# Patient Record
Sex: Female | Born: 1935 | Race: White | Hispanic: No | Smoking: Never smoker
Health system: Southern US, Community
[De-identification: ages and names within clinical notes are randomized; demographics above are authoritative.]

## PROBLEM LIST (undated history)

## (undated) DIAGNOSIS — K219 Gastro-esophageal reflux disease without esophagitis: Secondary | ICD-10-CM

## (undated) DIAGNOSIS — N183 Chronic kidney disease, stage 3 unspecified: Secondary | ICD-10-CM

## (undated) DIAGNOSIS — I10 Essential (primary) hypertension: Secondary | ICD-10-CM

## (undated) DIAGNOSIS — J189 Pneumonia, unspecified organism: Secondary | ICD-10-CM

## (undated) HISTORY — PX: SHOULDER SURGERY: SHX246

## (undated) HISTORY — PX: OTHER SURGICAL HISTORY: SHX169

## (undated) HISTORY — PX: FOOT SURGERY: SHX648

---

## 2005-02-24 ENCOUNTER — Emergency Department (HOSPITAL_COMMUNITY): Admission: EM | Admit: 2005-02-24 | Discharge: 2005-02-25 | Payer: Self-pay | Admitting: Emergency Medicine

## 2005-03-12 ENCOUNTER — Inpatient Hospital Stay (HOSPITAL_COMMUNITY): Admission: EM | Admit: 2005-03-12 | Discharge: 2005-03-15 | Payer: Self-pay | Admitting: Orthopedic Surgery

## 2010-07-11 ENCOUNTER — Other Ambulatory Visit: Payer: Self-pay | Admitting: Orthopedic Surgery

## 2010-07-11 ENCOUNTER — Other Ambulatory Visit (HOSPITAL_COMMUNITY): Payer: Self-pay | Admitting: Orthopedic Surgery

## 2010-07-11 ENCOUNTER — Ambulatory Visit (HOSPITAL_COMMUNITY)
Admission: RE | Admit: 2010-07-11 | Discharge: 2010-07-11 | Disposition: A | Discharge status: Home / Self Care | Payer: Medicare Other | Source: Ambulatory Visit | Attending: Orthopedic Surgery | Admitting: Orthopedic Surgery

## 2010-07-11 ENCOUNTER — Encounter (HOSPITAL_COMMUNITY): Payer: Medicare Other

## 2010-07-11 DIAGNOSIS — M1710 Unilateral primary osteoarthritis, unspecified knee: Secondary | ICD-10-CM | POA: Insufficient documentation

## 2010-07-11 DIAGNOSIS — Z01812 Encounter for preprocedural laboratory examination: Secondary | ICD-10-CM | POA: Insufficient documentation

## 2010-07-11 DIAGNOSIS — IMO0002 Reserved for concepts with insufficient information to code with codable children: Secondary | ICD-10-CM | POA: Insufficient documentation

## 2010-07-11 DIAGNOSIS — Z01811 Encounter for preprocedural respiratory examination: Secondary | ICD-10-CM | POA: Insufficient documentation

## 2010-07-11 DIAGNOSIS — Z01818 Encounter for other preprocedural examination: Secondary | ICD-10-CM | POA: Insufficient documentation

## 2010-07-11 LAB — CBC
HCT: 44.6 % (ref 36.0–46.0)
Hemoglobin: 14.8 g/dL (ref 12.0–15.0)
MCH: 31.7 pg (ref 26.0–34.0)
MCHC: 33.2 g/dL (ref 30.0–36.0)
MCV: 95.5 fL (ref 78.0–100.0)
Platelets: 227 10*3/uL (ref 150–400)
RBC: 4.67 MIL/uL (ref 3.87–5.11)
RDW: 12.2 % (ref 11.5–15.5)
WBC: 6.9 10*3/uL (ref 4.0–10.5)

## 2010-07-11 LAB — URINALYSIS, ROUTINE W REFLEX MICROSCOPIC
Bilirubin Urine: NEGATIVE
Glucose, UA: NEGATIVE mg/dL
Hgb urine dipstick: NEGATIVE
Ketones, ur: 15 mg/dL — AB
Nitrite: NEGATIVE
Protein, ur: NEGATIVE mg/dL
Specific Gravity, Urine: 1.02 (ref 1.005–1.030)
Urobilinogen, UA: 0.2 mg/dL (ref 0.0–1.0)

## 2010-07-11 LAB — COMPREHENSIVE METABOLIC PANEL
ALT: 15 U/L (ref 0–35)
AST: 23 U/L (ref 0–37)
Albumin: 4 g/dL (ref 3.5–5.2)
Alkaline Phosphatase: 74 U/L (ref 39–117)
CO2: 27 mEq/L (ref 19–32)
Calcium: 10.7 mg/dL — ABNORMAL HIGH (ref 8.4–10.5)
Chloride: 109 mEq/L (ref 96–112)
Creatinine, Ser: 0.96 mg/dL (ref 0.4–1.2)
GFR calc Af Amer: >60 mL/min (ref >60)
GFR calc non Af Amer: 57 mL/min — ABNORMAL LOW (ref >60)
Glucose, Bld: 85 mg/dL (ref 70–99)
Sodium: 142 mEq/L (ref 135–145)
Total Bilirubin: 1.2 mg/dL (ref 0.3–1.2)
Total Protein: 7.4 g/dL (ref 6.0–8.3)

## 2010-07-11 LAB — PROTIME-INR: Prothrombin Time: 13.7 seconds (ref 11.6–15.2)

## 2010-07-11 LAB — SURGICAL PCR SCREEN: Staphylococcus aureus: NEGATIVE

## 2010-07-20 ENCOUNTER — Inpatient Hospital Stay (HOSPITAL_COMMUNITY)
Admission: RE | Admit: 2010-07-20 | Discharge: 2010-07-23 | DRG: 470 | Disposition: A | Discharge status: Skilled Nursing Facility | Payer: Medicare Other | Source: Ambulatory Visit | Attending: Orthopedic Surgery | Admitting: Orthopedic Surgery

## 2010-07-20 DIAGNOSIS — R110 Nausea: Secondary | ICD-10-CM | POA: Diagnosis not present

## 2010-07-20 DIAGNOSIS — I10 Essential (primary) hypertension: Secondary | ICD-10-CM | POA: Diagnosis present

## 2010-07-20 DIAGNOSIS — Z01812 Encounter for preprocedural laboratory examination: Secondary | ICD-10-CM

## 2010-07-20 DIAGNOSIS — E785 Hyperlipidemia, unspecified: Secondary | ICD-10-CM | POA: Diagnosis present

## 2010-07-20 DIAGNOSIS — T40605A Adverse effect of unspecified narcotics, initial encounter: Secondary | ICD-10-CM | POA: Diagnosis not present

## 2010-07-20 DIAGNOSIS — M1710 Unilateral primary osteoarthritis, unspecified knee: Principal | ICD-10-CM | POA: Diagnosis present

## 2010-07-20 LAB — TYPE AND SCREEN
ABO/RH(D): O POS
Antibody Screen: NEGATIVE

## 2010-07-20 LAB — ABO/RH: ABO/RH(D): O POS

## 2010-07-21 LAB — CBC
HCT: 34.2 % — ABNORMAL LOW (ref 36.0–46.0)
Hemoglobin: 11.5 g/dL — ABNORMAL LOW (ref 12.0–15.0)
MCH: 31.5 pg (ref 26.0–34.0)
MCHC: 33.6 g/dL (ref 30.0–36.0)
MCV: 93.7 fL (ref 78.0–100.0)
Platelets: 200 10*3/uL (ref 150–400)
RBC: 3.65 MIL/uL — ABNORMAL LOW (ref 3.87–5.11)
RDW: 12.3 % (ref 11.5–15.5)
WBC: 11.4 10*3/uL — ABNORMAL HIGH (ref 4.0–10.5)

## 2010-07-21 LAB — BASIC METABOLIC PANEL
BUN: 16 mg/dL (ref 6–23)
Calcium: 9.4 mg/dL (ref 8.4–10.5)
Chloride: 107 mEq/L (ref 96–112)
Creatinine, Ser: 0.83 mg/dL (ref 0.4–1.2)
GFR calc Af Amer: >60 mL/min (ref >60)
Glucose, Bld: 120 mg/dL — ABNORMAL HIGH (ref 70–99)
Potassium: 3.9 mEq/L (ref 3.5–5.1)
Sodium: 139 mEq/L (ref 135–145)

## 2010-07-21 NOTE — H&P (Addendum)
NAME:  Gina [name], Gina NO.:  [id number]  MEDICAL RECORD NO.:  [id number]           PATIENT TYPE:  LOCATION:                                 FACILITY:  PHYSICIAN:  Ollen Gross, M.D.    DATE OF BIRTH:  April 01, 1933  DATE OF ADMISSION: DATE OF DISCHARGE:                             HISTORY & PHYSICAL   CHIEF COMPLAINT:  Right knee pain.  BRIEF HISTORY:  Gina [name] came in to see Dr. Lequita Halt back in January regarding bilateral knee pain that was worse on the right than the left. She states she has had problems with the knees for several years now, it seems to have gotten worse over the last year.  She states she has pain most of the time, but the worst part about this is her loss of function. She has had cortisone injections in the past without relief.  She is at a stage now where she is seeking more permanent solution to this and she now presents for right total knee arthroplasty.  MEDICATION ALLERGIES:  CODEINE.  She states she does fine with Vicodin. She denies any food, latex or metal allergies.  CURRENT MEDICATIONS: 1. Verapamil. 2. Benicar. 3. Vitamin D. 4. Advil, which she will discontinue 5 days prior to surgery. 5. Voltaren gel.  PRIMARY CARE PHYSICIAN:  Dr. Nolon Nations.  PAST MEDICAL HISTORY: 1. End-stage arthritis of the right knee. 2. History of shingles at the age of 89. 3. Dentures. 4. History of pneumonia 12 or 13 years ago. 5. Hypertension. 6. Hyperlipidemia. 7. Arthritis. 8. Menopause. 9. History of measles and mumps as a child.  PAST SURGICAL HISTORY:  Partial right shoulder replacement in 2006. Benign cyst removed from her neck in 1979.  FAMILY HISTORY:  Father passed at age 68, he had bone cancer.  Mother is living, she is 63.  She has heart disease and Alzheimer's.  SOCIAL HISTORY:  The patient is divorced.  She is retired.  She denies past or present use of alcohol or tobacco products.  She has 2 children. She lives  alone.  Following her hospital stay, she would like to go to some place.  Ms. Harl has been cleared for surgery by Dr. Ivory Broad, her primary care physician.  REVIEW OF SYSTEMS:  GENERAL:  Negative for fevers, chills, or weight change.  HEENT:  Positive for dentures.  Negative for blurred vision or insomnia.  DERMATOLOGIC:  Negative for rash or lesion.  RESPIRATORY: Negative for shortness of breath at rest or with exertion. CARDIOVASCULAR:  Negative for chest pain or palpitations.  GI:  Negative for nausea, vomiting, diarrhea.  GU: Negative for hematuria or dysuria. MUSCULOSKELETAL:  Positive for joint pain and morning stiffness.  PHYSICAL EXAMINATION:  VITAL SIGNS:  Pulse 76, respirations 18, blood pressure 116/82 in the left arm. GENERAL:  Gina [name] is alert and oriented x3.  She is a pleasant 77- year-old female.  She is well developed, well nourished, in no apparent distress.  She is at stated height of 5 feet 7 inches and stated weight of 200 pounds.  She is accompanied today by her daughter.  HEENT:  Normocephalic, atraumatic.  Extraocular movements intact. Positive for glasses.  Positive for dentures. NECK:  Supple.  Full range of motion without lymphadenopathy. CHEST:  Lungs are clear to auscultation bilaterally without wheezes, rhonchi, or rales. HEART:  Regular rate and rhythm without murmur. ABDOMEN:  Bowel sounds present in all 4 quadrants.  Abdomen is soft, nontender to palpation. EXTREMITIES:  Right knee range of motion 10 to 115 degrees, moderate crepitus throughout.  Negative for effusion, tenderness to palpation over the medial greater than lateral joint line. NEUROLOGIC:  Intact. PERIPHERAL VASCULAR:  Carotid pulses 2+ bilaterally without bruit.  RADIOGRAPHS:  AP and lateral views of the right knee reveal bone-on-bone changes in the medial and patellofemoral compartments.  IMPRESSION:  Right knee pain and AP and lateral views of the right knee reveal bone-on-bone  changes in the medial and patellofemoral compartments.  PLAN:  Right total knee arthroplasty to be performed by Dr. Lequita Halt.     Rozell Searing, PAC   ______________________________ Ollen Gross, M.D.    LD/MEDQ  D:  07/19/2010  T:  07/20/2010  Job:  [id number]  cc:   Dr. Nolon Nations  Electronically Signed by Rozell Searing  on 07/21/2010 08:08:09 AM Electronically Signed by Ollen Gross M.D. on 07/22/2010 09:02:25 AM

## 2010-07-22 LAB — BASIC METABOLIC PANEL
BUN: 10 mg/dL (ref 6–23)
CO2: 25 mEq/L (ref 19–32)
Calcium: 9.2 mg/dL (ref 8.4–10.5)
Creatinine, Ser: 0.89 mg/dL (ref 0.4–1.2)
GFR calc Af Amer: >60 mL/min (ref >60)
GFR calc non Af Amer: >60 mL/min (ref >60)
Glucose, Bld: 102 mg/dL — ABNORMAL HIGH (ref 70–99)
Potassium: 3.8 mEq/L (ref 3.5–5.1)
Sodium: 137 mEq/L (ref 135–145)

## 2010-07-22 LAB — CBC
HCT: 30.8 % — ABNORMAL LOW (ref 36.0–46.0)
Hemoglobin: 10.4 g/dL — ABNORMAL LOW (ref 12.0–15.0)
MCH: 31.6 pg (ref 26.0–34.0)
MCHC: 33.8 g/dL (ref 30.0–36.0)
MCV: 93.6 fL (ref 78.0–100.0)
Platelets: 160 10*3/uL (ref 150–400)
RBC: 3.29 MIL/uL — ABNORMAL LOW (ref 3.87–5.11)
RDW: 12.5 % (ref 11.5–15.5)
WBC: 10.1 10*3/uL (ref 4.0–10.5)

## 2010-07-22 NOTE — Op Note (Signed)
NAME:  Gina [name], Gina [name] NO.:  [id number]  MEDICAL RECORD NO.:  [id number]           PATIENT TYPE:  I  LOCATION:  0005                         FACILITY:  [facility]  PHYSICIAN:  Ollen Gross, M.D.    DATE OF BIRTH:  May 25, 1932  DATE OF PROCEDURE: DATE OF DISCHARGE:                              OPERATIVE REPORT   PREOPERATIVE DIAGNOSIS:  Osteoarthritis, right knee.  POSTOPERATIVE DIAGNOSIS:  Osteoarthritis, right knee.  PROCEDURE:  Right total knee arthroplasty.  SURGEON:  Ollen Gross, MD  ASSISTANT:  Alexzandrew L. Perkins, PAC  ANESTHESIA:  General.  ESTIMATED BLOOD LOSS:  Minimal.  DRAINS:  Hemovac x1.  TOURNIQUET TIME:  32 minutes at 300 mmHg.  COMPLICATIONS:  None.  CONDITION:  Stable to recovery.  BRIEF CLINICAL NOTE:  Gina [name] is a 77 year old female with end-stage arthritis of the right knee with progressively worsening pain and dysfunction.  She has failed nonoperative management and presents for right total knee arthroplasty.  PROCEDURE IN DETAIL:  After successful administration of general anesthetic, a tourniquet was placed on the right thigh and right lower extremity prepped and draped in usual sterile fashion.  Extremities wrapped in Esmarch, knee flexed, tourniquet inflated to 300 mmHg. Midline incision made with a 10 blade through subcutaneous tissue to the level of the extensor mechanism.  A fresh blade is used make a medial parapatellar arthrotomy.  Soft tissue on the proximal medial tibia is subperiosteally elevated to the joint line with the knife and into the semimembranosus bursa with a Cobb elevator.  Soft tissue laterally is elevated with attention being paid to avoid patellar tendon or tibial tubercle.  The patella was everted, knee flexed 90 degrees, and ACL and PCL removed.  Drill was used to create a starting hole in the distal femur.  Canal was thoroughly irrigated.  A 5 degrees right valgus alignment guide was placed  in distal femoral cutting block, pinned to remove 11 mm off the distal femur.  Distal femoral resection is made an oscillating saw.  The tibia subluxed forward and menisci removed.  Extramedullary tibial alignment guide is placed referencing proximally at the medial aspect of the tibial tubercle and distally along the second metatarsal axis tibial crest.  A block is pinned to remove 2 mm off the more deficient medial side.  Tibial resection is made with an oscillating saw.  Size 3 is the most appropriate tibial component and proximal tibia prepared to modular drill and keel punch for the size 3.  Femoral sizing guide is placed, size 3 is the most appropriate on the femur.  Size 3 cutting block is placed with the rotation marked off the epicondylar axis and confirmed by creating a rectangular flexion gap at 90 degrees.  Block is pinned in position and the anterior-posterior and chamfer cuts made.  Intercondylar block is placed and neck cut is made. Trial size 3 posterior stabilized femoral component was placed.  A 12.5- mm posterior stabilized rotating platform insert trial was placed.  A 12.5 full extensions achieved with excellent varus-valgus and anterior- posterior balance throughout full range of motion.  The patella was everted,  thickness measured to be 21 mm.  Freehand resection taken to 12 mm, 35 template is placed, lug holes were drilled, trial patella was placed and tracks normally.  Osteophytes removed off the posterior femur with the trial in place.  All trials removed and the cut bone surfaces prepared with pulsatile lavage.  Cements mixed and once ready, implantation of size 3 mobile bearing tibial tray, size 3 posterior stabilized femur, and 35 patella are cemented in place.  The patella was held with a clamp.  Trial 12.5-mm inserts placed, knee held in full extension, all extruded cement removed.  Cement fully hardened, then the permanent 12.5-mm posterior stabilized  rotating platform insert is placed in tibial tray.  Wounds copiously irrigated with saline solution. The arthrotomy closed over Hemovac drain with interrupted #1 PDS. Flexion against gravity to 140 degrees, the patella tracks normally. Tourniquet released for a total tourniquet time of 32 minutes.  Subcu was closed with interrupted 2-0 Vicryl, subcuticular running 4-0 Monocryl.  Catheter for Marcaine pain pump was placed and pumps initiated.  Incisions cleaned and dried and Steri-Strips and bulky sterile dressing applied.  She is then placed into a knee immobilizer, awakened and transported to recovery in stable condition.     Ollen Gross, M.D.     FA/MEDQ  D:  07/20/2010  T:  07/20/2010  Job:  [id number]  Electronically Signed by Ollen Gross M.D. on 07/22/2010 09:02:28 AM

## 2010-07-23 LAB — CBC
HCT: 28.8 % — ABNORMAL LOW (ref 36.0–46.0)
Hemoglobin: 9.5 g/dL — ABNORMAL LOW (ref 12.0–15.0)
MCH: 30.8 pg (ref 26.0–34.0)
MCHC: 33 g/dL (ref 30.0–36.0)
MCV: 93.5 fL (ref 78.0–100.0)
Platelets: 163 10*3/uL (ref 150–400)
RDW: 12.2 % (ref 11.5–15.5)
WBC: 10.1 10*3/uL (ref 4.0–10.5)

## 2010-08-08 NOTE — Discharge Summary (Signed)
  NAME:  Gina [name], LOTTER NO.:  [id number]  MEDICAL RECORD NO.:  [id number]           PATIENT TYPE:  I  LOCATION:  1538                         FACILITY:  [facility]  PHYSICIAN:  Ollen Gross, M.D.    DATE OF BIRTH:  27-Jul-1932  DATE OF ADMISSION:  07/20/2010 DATE OF DISCHARGE:  07/23/2010                        DISCHARGE SUMMARY - REFERRING   ADDENDUM:  ADMITTING DIAGNOSIS:  Please see previous discharge summary.  DISCHARGE DIAGNOSIS:  Please see previous discharge summary.  PROCEDURE:  Right total knee.  HOSPITAL COURSE:  The patient was seen on rounds on Saturday, July 23, 2010, by Dr. Lequita Halt.  She had had some intermittent nausea but difficulty taking p.o.'s but by Saturday, her meds were changed and she was tolerating her p.o.'s much better as well as she tolerated her lunch and solid food.  She will be transferred to [facility]  DISCHARGE PLAN:  The patient will be transferred to [facility] on July 23, 2010.  Discharge plan and medications as per previously dictated summary.  CONDITION ON DISCHARGE:  Improving.     Alexzandrew L. Julien Girt, P.A.C.   ______________________________ Ollen Gross, M.D.    ALP/MEDQ  D:  07/23/2010  T:  07/23/2010  Job:  [id number]  Electronically Signed by Patrica Duel P.A.C. on 07/27/2010 12:46:44 PM Electronically Signed by Ollen Gross M.D. on 08/08/2010 07:09:27 AM

## 2010-08-15 NOTE — Discharge Summary (Signed)
NAME:  MAPLE, ODANIEL NO.:  [id number]  MEDICAL RECORD NO.:  [id number]           PATIENT TYPE:  I  LOCATION:  1538                         FACILITY:  [facility]  PHYSICIAN:  Ollen Gross, M.D.    DATE OF BIRTH:  [birth year]  DATE OF ADMISSION:  07/20/2010 DATE OF DISCHARGE:  07/23/2010                              DISCHARGE SUMMARY   TENTATIVE DATE OF DISCHARGE:  07/23/2010  ADMITTING DIAGNOSES: 1. Right knee end-stage arthritis. 2. History of shingles. 3. History of pneumonia. 4. Hypertension. 5. Hyperlipidemia. 6. Postmenopausal. 7. Childhood illnesses of measles, mumps.  DISCHARGE DIAGNOSES: 1. Osteoarthritis, right knee, status post right total knee     replacement arthroplasty. 2. History of shingles. 3. History of pneumonia. 4. Hypertension. 5. Hyperlipidemia. 6. Postmenopausal. 7. Childhood illnesses of measles, mumps.  PROCEDURE:  July 20, 2010, right total knee.  Surgeon, Dr. Lequita Halt. Assistant, Alexzandrew L. Perkins, P.A.C.  Anesthesia was general. Tourniquet time was 32 minutes.  CONSULTS:  None.  BRIEF HISTORY:  The patient is a 77 year old female with end-stage arthritis of the right knee with progressive worsening pain and dysfunction, failed nonoperative management and now presents for a total knee arthroplasty.  LABORATORY DATA:  Preop CBC showed hemoglobin of 14.8, hematocrit of 44.6, white cell count 6.9, platelets 227.  Postop hemoglobin 11.5. Last noted H and H at time of dictation on postop day #2 was 10.4 and 30.8.  One more CBC is pending on the possible day of discharge.  PT/INR of 13.7 and 1.03 on admission with PTT of 35.  Chem panel on admission, minimally elevated BUN at 25.  Remaining Chem panel within normal limits.  Calcium was slightly elevated at 10.7.  Preop UA was little cloudy, positive ketones, otherwise negative.  Blood group type O positive.  Nasal swabs were negative for staph aureus, negative for MRSA.   Serial BMETs were followed for 48 hours.  Electrolytes all remained within normal limits.  X-rays, two-view chest taken on July 11, 2010, no active cardiopulmonary disease.  EKG dated July 11, 2010, sinus rhythm with first-degree AV block, nonspecific ST and T-wave abnormalities confirmed by Dr. Lewayne Bunting.  HOSPITAL COURSE:  The patient admitted to [facility], taken to OR, underwent above-stated procedure without complication.  The patient tolerated the procedure well, later transferred to the recovery room on the orthopedic floor, given 24 hours postop IV antibiotics, given Xarelto for DVT prophylaxis.  She did have some nausea through the night.  It was felt be due to the IV morphine, so we discontinued that and gave her IV Dilaudid as a backup but encouraged her to take p.o. meds once her nausea improved.  Her blood pressure meds were restarted but on parameters due to the soft blood pressure following surgery hemoglobin was 10.5.  Hemovac drain at time of surgery was removed on day #1.  By day #2, the patient still had a little bit of nausea and had not had much p.o. intake.  At that point, we felt that it might have been the oral oxycodone, so we discontinued that and switched over to Dilaudid, see  if she could better control with it.  From a therapy standpoint, she was slowly progressing and she was up walking only about 8 or 10 feet on day #2.  Dressing changed, incision looked good.  It was felt that the patient would require skilled facility.  She wanted to look into a skilled facility for continued care.  She had been seen on rounds on Friday and postop day #2 and it was felt that there was a possibility that a bed was available on Saturday and the facility was able to take weekend discharges that she possibly go tomorrow on the day of July 23, 2010.  DISCHARGE/PLAN: 1. Possible discharge tomorrow on July 23, 2010, depending on bed     availability. 2.  Discharge diagnosis:  Please see above. 3. Discharge medications:  Current medications at time of dictation include: 1. Xarelto 10 mg p.o. daily for 2 weeks, then discontinue the Xarelto 2. Colace.100 mg p.o. b.i.d. 3. Verapamil 100 mg p.o. q.h.s. 4. Benicar 20 mg p.o. a.m. 5. Robaxin 500 mg p.o. q.6-8 h p.r.n. spasm. 6. Dilaudid 2 mg one or two every 4 to 6 hours as needed for pain. 7. Tylenol 325 one or two every 4 to 6 hours as needed for mild pain,     temperature or headache.  DIET:  As tolerated.  Heart-healthy diet.  ACTIVITY:  Weightbearing as tolerated.  Total knee protocol.  PT and OT for gait training, ambulation, ADLs, range of motion, and strengthening exercises.  FOLLOWUP:  The patient needs to follow up Dr. Lequita Halt in the office on Tuesday the 24th.  Please contact the office at [phone] to help the patient arrange followup care and transportation over at [facility].  DISPOSITION:  Pending at time of dictation.  CONDITION ON DISCHARGE:  Slowly improving and final condition is pending at time of dictation.     Alexzandrew L. Julien Girt, P.A.C.   ______________________________Frank Lequita Halt, M.D.    ALP/MEDQ  D:  07/22/2010  T:  07/22/2010  Job:  [id number]  cc:   Dr. Nolon Nations  Electronically Signed by Patrica Duel P.A.C. on 08/11/2010 10:02:29 AM Electronically Signed by Ollen Gross M.D. on 08/15/2010 06:46:51 PM

## 2011-06-29 ENCOUNTER — Ambulatory Visit: Payer: Medicare Other | Attending: Orthopedic Surgery | Admitting: Physical Therapy

## 2011-06-29 DIAGNOSIS — R262 Difficulty in walking, not elsewhere classified: Secondary | ICD-10-CM | POA: Insufficient documentation

## 2011-06-29 DIAGNOSIS — M542 Cervicalgia: Secondary | ICD-10-CM | POA: Insufficient documentation

## 2011-06-29 DIAGNOSIS — IMO0001 Reserved for inherently not codable concepts without codable children: Secondary | ICD-10-CM | POA: Insufficient documentation

## 2011-06-29 DIAGNOSIS — M2560 Stiffness of unspecified joint, not elsewhere classified: Secondary | ICD-10-CM | POA: Insufficient documentation

## 2011-07-04 ENCOUNTER — Ambulatory Visit: Payer: Medicare Other

## 2011-07-11 ENCOUNTER — Ambulatory Visit: Payer: Medicare Other | Attending: Orthopedic Surgery

## 2011-07-11 DIAGNOSIS — R262 Difficulty in walking, not elsewhere classified: Secondary | ICD-10-CM | POA: Insufficient documentation

## 2011-07-11 DIAGNOSIS — M2560 Stiffness of unspecified joint, not elsewhere classified: Secondary | ICD-10-CM | POA: Insufficient documentation

## 2011-07-11 DIAGNOSIS — IMO0001 Reserved for inherently not codable concepts without codable children: Secondary | ICD-10-CM | POA: Insufficient documentation

## 2011-07-11 DIAGNOSIS — M542 Cervicalgia: Secondary | ICD-10-CM | POA: Insufficient documentation

## 2011-07-13 ENCOUNTER — Ambulatory Visit: Payer: Medicare Other | Admitting: Rehabilitation

## 2011-07-20 ENCOUNTER — Ambulatory Visit: Payer: Medicare Other | Admitting: Physical Therapy

## 2011-07-25 ENCOUNTER — Ambulatory Visit: Payer: Medicare Other | Admitting: Physical Therapy

## 2011-07-27 ENCOUNTER — Ambulatory Visit: Payer: Medicare Other | Admitting: Rehabilitation

## 2020-06-11 ENCOUNTER — Emergency Department (HOSPITAL_COMMUNITY): Payer: Medicare Other

## 2020-06-11 ENCOUNTER — Other Ambulatory Visit: Payer: Self-pay

## 2020-06-11 ENCOUNTER — Inpatient Hospital Stay (HOSPITAL_COMMUNITY)
Admission: EM | Admit: 2020-06-11 | Discharge: 2020-06-13 | DRG: 871 | Disposition: A | Discharge status: Home Health | Payer: Medicare Other | Attending: Family Medicine | Admitting: Family Medicine

## 2020-06-11 DIAGNOSIS — E876 Hypokalemia: Secondary | ICD-10-CM | POA: Diagnosis not present

## 2020-06-11 DIAGNOSIS — Z8249 Family history of ischemic heart disease and other diseases of the circulatory system: Secondary | ICD-10-CM | POA: Diagnosis not present

## 2020-06-11 DIAGNOSIS — N1831 Chronic kidney disease, stage 3a: Secondary | ICD-10-CM | POA: Diagnosis not present

## 2020-06-11 DIAGNOSIS — M1712 Unilateral primary osteoarthritis, left knee: Secondary | ICD-10-CM | POA: Diagnosis not present

## 2020-06-11 DIAGNOSIS — Z20822 Contact with and (suspected) exposure to covid-19: Secondary | ICD-10-CM | POA: Diagnosis present

## 2020-06-11 DIAGNOSIS — E871 Hypo-osmolality and hyponatremia: Secondary | ICD-10-CM | POA: Diagnosis not present

## 2020-06-11 DIAGNOSIS — B961 Klebsiella pneumoniae [K. pneumoniae] as the cause of diseases classified elsewhere: Secondary | ICD-10-CM | POA: Diagnosis not present

## 2020-06-11 DIAGNOSIS — W1830XA Fall on same level, unspecified, initial encounter: Secondary | ICD-10-CM | POA: Diagnosis not present

## 2020-06-11 DIAGNOSIS — Z79899 Other long term (current) drug therapy: Secondary | ICD-10-CM | POA: Diagnosis not present

## 2020-06-11 DIAGNOSIS — H9190 Unspecified hearing loss, unspecified ear: Secondary | ICD-10-CM | POA: Diagnosis present

## 2020-06-11 DIAGNOSIS — W19XXXA Unspecified fall, initial encounter: Secondary | ICD-10-CM

## 2020-06-11 DIAGNOSIS — A419 Sepsis, unspecified organism: Principal | ICD-10-CM | POA: Diagnosis present

## 2020-06-11 DIAGNOSIS — M549 Dorsalgia, unspecified: Secondary | ICD-10-CM | POA: Diagnosis present

## 2020-06-11 DIAGNOSIS — K219 Gastro-esophageal reflux disease without esophagitis: Secondary | ICD-10-CM | POA: Diagnosis present

## 2020-06-11 DIAGNOSIS — S0001XA Abrasion of scalp, initial encounter: Secondary | ICD-10-CM | POA: Diagnosis not present

## 2020-06-11 DIAGNOSIS — R509 Fever, unspecified: Secondary | ICD-10-CM | POA: Diagnosis present

## 2020-06-11 DIAGNOSIS — Y92009 Unspecified place in unspecified non-institutional (private) residence as the place of occurrence of the external cause: Secondary | ICD-10-CM

## 2020-06-11 DIAGNOSIS — I129 Hypertensive chronic kidney disease with stage 1 through stage 4 chronic kidney disease, or unspecified chronic kidney disease: Secondary | ICD-10-CM | POA: Diagnosis present

## 2020-06-11 DIAGNOSIS — R531 Weakness: Secondary | ICD-10-CM

## 2020-06-11 DIAGNOSIS — N390 Urinary tract infection, site not specified: Secondary | ICD-10-CM | POA: Diagnosis present

## 2020-06-11 DIAGNOSIS — I10 Essential (primary) hypertension: Secondary | ICD-10-CM | POA: Diagnosis present

## 2020-06-11 DIAGNOSIS — Z885 Allergy status to narcotic agent status: Secondary | ICD-10-CM | POA: Diagnosis not present

## 2020-06-11 DIAGNOSIS — Z801 Family history of malignant neoplasm of trachea, bronchus and lung: Secondary | ICD-10-CM

## 2020-06-11 DIAGNOSIS — Z96651 Presence of right artificial knee joint: Secondary | ICD-10-CM | POA: Diagnosis not present

## 2020-06-11 DIAGNOSIS — R6510 Systemic inflammatory response syndrome (SIRS) of non-infectious origin without acute organ dysfunction: Secondary | ICD-10-CM | POA: Diagnosis present

## 2020-06-11 DIAGNOSIS — G8929 Other chronic pain: Secondary | ICD-10-CM | POA: Diagnosis present

## 2020-06-11 DIAGNOSIS — I959 Hypotension, unspecified: Secondary | ICD-10-CM | POA: Diagnosis not present

## 2020-06-11 DIAGNOSIS — J189 Pneumonia, unspecified organism: Secondary | ICD-10-CM | POA: Diagnosis not present

## 2020-06-11 HISTORY — DX: Chronic kidney disease, stage 3 unspecified: N18.30

## 2020-06-11 HISTORY — DX: Gastro-esophageal reflux disease without esophagitis: K21.9

## 2020-06-11 HISTORY — DX: Essential (primary) hypertension: I10

## 2020-06-11 NOTE — ED Provider Notes (Signed)
[facility] EMERGENCY DEPARTMENT Provider Note   CSN: [id number] Arrival date & time: 06/11/20  2117     History Chief Complaint  Patient presents with  . Fall    Gina [name] is a 87 y.o. female with hx of HTN, GERD, LE edema.   HPI  History provided by patient and her daughter.  Daughter went to her moms house today with her husband to adjust the temperature in her home. Pt has been taking antibiotics for a UTI that were given to her by her PCP. Daughter states she noticed her mom had been shuffling when she walked. Pt lives alone and is independent.   Daughter witness her mom fall today outside the locked door. Pt's daughter and son-in-law had to break into her mom's home as the heavy duty lock was on. This took about 10 minutes. The pt continued to talk to her daughter. There was no LOC. The pt did hit her head on the wooden table during the fall and injuring her scalp. Pt says she felt weak yesterday and blames the antibiotic. Daughter did not notice any difference in her mom yesterday.   Denies cough, fever, dysuria, chest pain, shortness of breath, back pain, abdominal pain, vomiting, diarrhea. Has chronic left knee pain and neck pain. Endorses back pain since the fall.      No past medical history on file.  There are no problems to display for this patient.     OB History   No obstetric history on file.     No family history on file.     Home Medications Prior to Admission medications   Not on File    Allergies    Patient has no allergy information on record.  Review of Systems   Review of Systems  Constitutional: Negative for fever.  HENT: Negative for sore throat.   Eyes:       No blurry vision  Respiratory: Negative for cough and shortness of breath.   Cardiovascular: Negative for chest pain.  Gastrointestinal: Negative for abdominal pain, nausea and vomiting.  Genitourinary: Negative for dysuria.  Musculoskeletal: Positive for  back pain. Negative for neck pain (chronic).  Skin: Positive for wound.  Neurological: Negative for dizziness, syncope, light-headedness and headaches.  All other systems reviewed and are negative.   Physical Exam Updated Vital Signs BP (!) 117/49   Pulse 91   Temp 98 F (36.7 C) (Oral)   Resp (!) 26   SpO2 94%   Physical Exam Vitals and nursing note reviewed.  Constitutional:      Appearance: Normal appearance. She is not ill-appearing.     Comments: Laughing with staff and daughter  HENT:     Head: Normocephalic.     Comments: Occiput abrasion     Nose: Rhinorrhea present.     Mouth/Throat:     Mouth: Mucous membranes are dry.     Pharynx: Oropharynx is clear. No oropharyngeal exudate or posterior oropharyngeal erythema.  Eyes:     Extraocular Movements: Extraocular movements intact.     Conjunctiva/sclera: Conjunctivae normal.     Comments: Wearing glasses  Cardiovascular:     Rate and Rhythm: Normal rate and regular rhythm.     Pulses: Normal pulses.     Heart sounds: Normal heart sounds. No murmur heard.   Pulmonary:     Effort: Pulmonary effort is normal. No respiratory distress.     Breath sounds: Normal breath sounds. No wheezing, rhonchi or rales.  Chest:  Chest wall: No tenderness.  Abdominal:     General: Bowel sounds are normal.     Palpations: Abdomen is soft. There is no mass.     Tenderness: There is no abdominal tenderness. There is no right CVA tenderness, left CVA tenderness or guarding.  Musculoskeletal:        General: No tenderness, deformity or signs of injury.     Cervical back: Normal range of motion. No tenderness (No C spine).     Right lower leg: No edema.     Left lower leg: No edema.     Comments: Decreased ROM of left knee 2/2 to arthritis (baseline per daughter), decreased ROM right shoulder (baseline per daughter), no C, T or L tenderness, no leg length discrepancy   Skin:    General: Skin is warm.     Capillary Refill:  Capillary refill takes less than 2 seconds.     Comments: Dried blood on scalp from abrasion, linear surgical scar on right knee   Neurological:     Mental Status: She is alert. Mental status is at baseline.     Cranial Nerves: Cranial nerve deficit: CN 2-12 grossly intact      Comments: Gross sensation intact. Strength bilateral LE 5/5, RUE grip strength 5/5, shoulder 3-/5 (baseline per daughter), Normal finger to nose, normal heel to shin on right however pt unable to perform on the left 2/2 to dec knee ROM  Psychiatric:        Mood and Affect: Mood normal.        Behavior: Behavior normal.     ED Results / Procedures / Treatments   Labs (all labs ordered are listed, but only abnormal results are displayed) Labs Reviewed - No data to display  EKG None  Radiology DG Chest 1 View  Result Date: 06/11/2020 CLINICAL DATA:  Fall EXAM: CHEST  1 VIEW; PELVIS - 1-2 VIEW COMPARISON:  Chest radiograph 07/11/2010 FINDINGS: Chest: Low lung volumes with streaky and hazy opacities, most compatible with atelectatic change. The aorta is calcified. The remaining cardiomediastinal contours are unremarkable for portable technique. Prior right shoulder arthroplasty. High-riding appearance of the humeral head without frank dislocation, most suggestive of rotator cuff insufficiency though if there is concern for acute shoulder injury, consider dedicated radiographs. Additional degenerative changes left shoulder and bilateral acromioclavicular joints. No visible displaced rib fractures or other acute osseous injury is identified in the chest. Pelvis: The osseous structures appear diffusely demineralized which may limit detection of small or nondisplaced fractures. Bones of the pelvis appear intact and congruent. Femoral heads are normally located. Proximal femora are intact. Soft tissues are free of acute abnormality. IMPRESSION: 1. Low lung volumes with streaky and hazy opacities, most compatible with atelectatic  change. 2. High-riding appearance of the humeral head without frank dislocation, most suggestive of rotator cuff insufficiency though if there is concern for acute shoulder injury, consider dedicated radiographs. 3. No other acute abnormality of the chest. 4. No acute osseous abnormality of the pelvis. Electronically Signed   By: Kreg Shropshire M.D.   On: 06/11/2020 23:15   DG Pelvis 1-2 Views  Result Date: 06/11/2020 CLINICAL DATA:  Fall EXAM: CHEST  1 VIEW; PELVIS - 1-2 VIEW COMPARISON:  Chest radiograph 07/11/2010 FINDINGS: Chest: Low lung volumes with streaky and hazy opacities, most compatible with atelectatic change. The aorta is calcified. The remaining cardiomediastinal contours are unremarkable for portable technique. Prior right shoulder arthroplasty. High-riding appearance of the humeral head without frank dislocation, most  suggestive of rotator cuff insufficiency though if there is concern for acute shoulder injury, consider dedicated radiographs. Additional degenerative changes left shoulder and bilateral acromioclavicular joints. No visible displaced rib fractures or other acute osseous injury is identified in the chest. Pelvis: The osseous structures appear diffusely demineralized which may limit detection of small or nondisplaced fractures. Bones of the pelvis appear intact and congruent. Femoral heads are normally located. Proximal femora are intact. Soft tissues are free of acute abnormality. IMPRESSION: 1. Low lung volumes with streaky and hazy opacities, most compatible with atelectatic change. 2. High-riding appearance of the humeral head without frank dislocation, most suggestive of rotator cuff insufficiency though if there is concern for acute shoulder injury, consider dedicated radiographs. 3. No other acute abnormality of the chest. 4. No acute osseous abnormality of the pelvis. Electronically Signed   By: Kreg Shropshire M.D.   On: 06/11/2020 23:15   CT Head Wo Contrast  Result Date:  06/11/2020 CLINICAL DATA:  Head injury, neck pain EXAM: CT HEAD WITHOUT CONTRAST CT CERVICAL SPINE WITHOUT CONTRAST TECHNIQUE: Multidetector CT imaging of the head and cervical spine was performed following the standard protocol without intravenous contrast. Multiplanar CT image reconstructions of the cervical spine were also generated. COMPARISON:  None. FINDINGS: CT HEAD FINDINGS Brain: Normal anatomic configuration. Parenchymal volume loss is commensurate with the patient's age. Mild periventricular white matter changes are present likely reflecting the sequela of small vessel ischemia. 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 No abnormal intra or extra-axial mass lesion or fluid collection. No abnormal mass effect or midline shift. No evidence of acute intracranial hemorrhage or infarct. Ventricular size is normal. Cerebellum unremarkable. Vascular: No asymmetric hyperdense vasculature at the skull base. Skull: Intact Sinuses/Orbits: Paranasal sinuses are clear. Orbits are unremarkable. Other: Mastoid air cells and middle ear cavities are clear. CT CERVICAL SPINE FINDINGS Alignment: Mild reversal of the normal cervical lordosis. 2 mm anterolisthesis of C4 upon C5 and C5 upon C6 is likely degenerative in nature. Skull base and vertebrae: The craniocervical junction is unremarkable. The atlantodental interval is normal. No acute fracture of the cervical spine. Soft tissues and spinal canal: No prevertebral fluid or swelling. No visible canal hematoma. Disc levels: There is diffuse intervertebral disc space narrowing and degenerative disc calcification throughout the cervical spine, most severe at C6-7 in keeping with changes of moderate to severe degenerative disc disease. Vertebral body height has been preserved. Thickening of the prevertebral soft tissues is artifactual and related to kyphotic positioning. Multilevel uncovertebral and facet arthrosis is present resulting in mild left neuroforaminal  narrowing at C3-4 and C5-6. The spinal canal is widely patent. Upper chest: Unremarkable Other: None IMPRESSION: No acute intracranial abnormality.  No calvarial fracture. No acute fracture or listhesis of the cervical spine. Electronically Signed   By: Helyn Numbers MD   On: 06/11/2020 23:03   CT Cervical Spine Wo Contrast  Result Date: 06/11/2020 CLINICAL DATA:  Head injury, neck pain EXAM: CT HEAD WITHOUT CONTRAST CT CERVICAL SPINE WITHOUT CONTRAST TECHNIQUE: Multidetector CT imaging of the head and cervical spine was performed following the standard protocol without intravenous contrast. Multiplanar CT image reconstructions of the cervical spine were also generated. COMPARISON:  None. FINDINGS: CT HEAD FINDINGS Brain: Normal anatomic configuration. Parenchymal volume loss is commensurate with the patient's age. Mild periventricular white matter changes are present likely reflecting the sequela of small vessel ischemia. 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 No abnormal intra or extra-axial mass lesion or fluid  collection. No abnormal mass effect or midline shift. No evidence of acute intracranial hemorrhage or infarct. Ventricular size is normal. Cerebellum unremarkable. Vascular: No asymmetric hyperdense vasculature at the skull base. Skull: Intact Sinuses/Orbits: Paranasal sinuses are clear. Orbits are unremarkable. Other: Mastoid air cells and middle ear cavities are clear. CT CERVICAL SPINE FINDINGS Alignment: Mild reversal of the normal cervical lordosis. 2 mm anterolisthesis of C4 upon C5 and C5 upon C6 is likely degenerative in nature. Skull base and vertebrae: The craniocervical junction is unremarkable. The atlantodental interval is normal. No acute fracture of the cervical spine. Soft tissues and spinal canal: No prevertebral fluid or swelling. No visible canal hematoma. Disc levels: There is diffuse intervertebral disc space narrowing and degenerative disc calcification throughout  the cervical spine, most severe at C6-7 in keeping with changes of moderate to severe degenerative disc disease. Vertebral body height has been preserved. Thickening of the prevertebral soft tissues is artifactual and related to kyphotic positioning. Multilevel uncovertebral and facet arthrosis is present resulting in mild left neuroforaminal narrowing at C3-4 and C5-6. The spinal canal is widely patent. Upper chest: Unremarkable Other: None IMPRESSION: No acute intracranial abnormality.  No calvarial fracture. No acute fracture or listhesis of the cervical spine. Electronically Signed   By: Helyn NumbersAshesh  Parikh MD   On: 06/11/2020 23:03    Procedures Procedures   Medications Ordered in ED Medications - No data to display  ED Course  I have reviewed the triage vital signs and the nursing notes.  Pertinent labs & imaging results that were available during my care of the patient were reviewed by me and considered in my medical decision making (see chart for details).  10:45 PM PM Reassess with Dr. Stevie Kernykstra. Unable to visible source of blood on pt's scalp.   11:15 PM  Reassess with RN. Scalp cleansed. Pt has abrasion. No current bleeding appreciated.   11:45 PM  Patient taking short shuffling steps. Will consult PT and SW. Discussed possibility of admission with pt and her daughter.      MDM Rules/Calculators/A&P                          Pt is a 87 yo female with hx of HTN, GERD who presented after a fall at home. CT head and C-spine without acute pathology. CXR with streaky opacities and atelectasis. Pt with chronic left knee pain which she states she needs surgery. She has difficulty moving her right shoulder at baseline. CXR notes high-riding humeral head c/w rotator cuff insuffiencey. Pelvis xray negative.   Pt refuses to ambulate more than two steps while I was in the room. Will obtain knee xrays and labs. Neurology exam remains unremarkable. Consider MR Brain to look for CVA. PT and TOC  consulted. Labs and UA pending.   Pt signed out to Frederik PearMia McDonald, PA.    Final Clinical Impression(s) / ED Diagnoses Final diagnoses:  None    Rx / DC Orders ED Discharge Orders    None       Katha CabalBrimage, Oleva Koo, DO 06/12/20 0059    Milagros Lollykstra, Richard S, MD 06/14/20 [phone]

## 2020-06-11 NOTE — ED Provider Notes (Incomplete)
[facility] EMERGENCY DEPARTMENT Provider Note   CSN: [id number] Arrival date & time: 06/11/20  2117     History Chief Complaint  Patient presents with  . Fall    Gina [name] is a 87 y.o. female with hx of HTN, GERD, LE edema.   HPI  History provided by patient and her daughter.  Daughter went to her moms house today with her husband to adjust the temperature in her home. Pt has been taking antibiotics for a UTI that were given to her by her PCP. Daughter states she noticed her mom had been shuffling when she walked. Pt lives alone and is independent.   Daughter witness her mom fall today outside the locked door. Pt's daughter and son-in-law had to break into her mom's home as the heavy duty lock was on. This took about 10 minutes. The pt continued to talk to her daughter. There was no LOC. The pt did hit her head on the wooden table during the fall and injuring her scalp. Pt says she felt weak yesterday and blames the antibiotic. Daughter did not notice any difference in her mom yesterday.   Denies cough, fever, dysuria, chest pain, shortness of breath, back pain, abdominal pain, vomiting, diarrhea. Has chronic left knee pain and neck pain. Endorses back pain since the fall.      No past medical history on file.  There are no problems to display for this patient.     OB History   No obstetric history on file.     No family history on file.     Home Medications Prior to Admission medications   Not on File    Allergies    Patient has no allergy information on record.  Review of Systems   Review of Systems  Constitutional: Negative for fever.  HENT: Negative for sore throat.   Eyes:       No blurry vision  Respiratory: Negative for cough and shortness of breath.   Cardiovascular: Negative for chest pain.  Gastrointestinal: Negative for abdominal pain, nausea and vomiting.  Genitourinary: Negative for dysuria.  Musculoskeletal: Positive for  back pain. Negative for neck pain (chronic).  Skin: Positive for wound.  Neurological: Negative for dizziness, syncope, light-headedness and headaches.  All other systems reviewed and are negative.   Physical Exam Updated Vital Signs BP (!) 117/49   Pulse 91   Temp 98 F (36.7 C) (Oral)   Resp (!) 26   SpO2 94%   Physical Exam Vitals and nursing note reviewed.  Constitutional:      Appearance: Normal appearance. She is not ill-appearing.     Comments: Laughing with staff and daughter  HENT:     Head: Normocephalic.     Comments: Occiput abrasion     Nose: Nose normal.  Neurological:     Mental Status: She is alert.     ED Results / Procedures / Treatments   Labs (all labs ordered are listed, but only abnormal results are displayed) Labs Reviewed - No data to display  EKG None  Radiology DG Chest 1 View  Result Date: 06/11/2020 CLINICAL DATA:  Fall EXAM: CHEST  1 VIEW; PELVIS - 1-2 VIEW COMPARISON:  Chest radiograph 07/11/2010 FINDINGS: Chest: Low lung volumes with streaky and hazy opacities, most compatible with atelectatic change. The aorta is calcified. The remaining cardiomediastinal contours are unremarkable for portable technique. Prior right shoulder arthroplasty. High-riding appearance of the humeral head without frank dislocation, most suggestive of rotator  cuff insufficiency though if there is concern for acute shoulder injury, consider dedicated radiographs. Additional degenerative changes left shoulder and bilateral acromioclavicular joints. No visible displaced rib fractures or other acute osseous injury is identified in the chest. Pelvis: The osseous structures appear diffusely demineralized which may limit detection of small or nondisplaced fractures. Bones of the pelvis appear intact and congruent. Femoral heads are normally located. Proximal femora are intact. Soft tissues are free of acute abnormality. IMPRESSION: 1. Low lung volumes with streaky and hazy  opacities, most compatible with atelectatic change. 2. High-riding appearance of the humeral head without frank dislocation, most suggestive of rotator cuff insufficiency though if there is concern for acute shoulder injury, consider dedicated radiographs. 3. No other acute abnormality of the chest. 4. No acute osseous abnormality of the pelvis. Electronically Signed   By: Kreg Shropshire M.D.   On: 06/11/2020 23:15   DG Pelvis 1-2 Views  Result Date: 06/11/2020 CLINICAL DATA:  Fall EXAM: CHEST  1 VIEW; PELVIS - 1-2 VIEW COMPARISON:  Chest radiograph 07/11/2010 FINDINGS: Chest: Low lung volumes with streaky and hazy opacities, most compatible with atelectatic change. The aorta is calcified. The remaining cardiomediastinal contours are unremarkable for portable technique. Prior right shoulder arthroplasty. High-riding appearance of the humeral head without frank dislocation, most suggestive of rotator cuff insufficiency though if there is concern for acute shoulder injury, consider dedicated radiographs. Additional degenerative changes left shoulder and bilateral acromioclavicular joints. No visible displaced rib fractures or other acute osseous injury is identified in the chest. Pelvis: The osseous structures appear diffusely demineralized which may limit detection of small or nondisplaced fractures. Bones of the pelvis appear intact and congruent. Femoral heads are normally located. Proximal femora are intact. Soft tissues are free of acute abnormality. IMPRESSION: 1. Low lung volumes with streaky and hazy opacities, most compatible with atelectatic change. 2. High-riding appearance of the humeral head without frank dislocation, most suggestive of rotator cuff insufficiency though if there is concern for acute shoulder injury, consider dedicated radiographs. 3. No other acute abnormality of the chest. 4. No acute osseous abnormality of the pelvis. Electronically Signed   By: Kreg Shropshire M.D.   On: 06/11/2020 23:15    CT Head Wo Contrast  Result Date: 06/11/2020 CLINICAL DATA:  Head injury, neck pain EXAM: CT HEAD WITHOUT CONTRAST CT CERVICAL SPINE WITHOUT CONTRAST TECHNIQUE: Multidetector CT imaging of the head and cervical spine was performed following the standard protocol without intravenous contrast. Multiplanar CT image reconstructions of the cervical spine were also generated. COMPARISON:  None. FINDINGS: CT HEAD FINDINGS Brain: Normal anatomic configuration. Parenchymal volume loss is commensurate with the patient's age. Mild periventricular white matter changes are present likely reflecting the sequela of small vessel ischemia. 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 No abnormal intra or extra-axial mass lesion or fluid collection. No abnormal mass effect or midline shift. No evidence of acute intracranial hemorrhage or infarct. Ventricular size is normal. Cerebellum unremarkable. Vascular: No asymmetric hyperdense vasculature at the skull base. Skull: Intact Sinuses/Orbits: Paranasal sinuses are clear. Orbits are unremarkable. Other: Mastoid air cells and middle ear cavities are clear. CT CERVICAL SPINE FINDINGS Alignment: Mild reversal of the normal cervical lordosis. 2 mm anterolisthesis of C4 upon C5 and C5 upon C6 is likely degenerative in nature. Skull base and vertebrae: The craniocervical junction is unremarkable. The atlantodental interval is normal. No acute fracture of the cervical spine. Soft tissues and spinal canal: No prevertebral fluid or swelling. No visible canal hematoma.  Disc levels: There is diffuse intervertebral disc space narrowing and degenerative disc calcification throughout the cervical spine, most severe at C6-7 in keeping with changes of moderate to severe degenerative disc disease. Vertebral body height has been preserved. Thickening of the prevertebral soft tissues is artifactual and related to kyphotic positioning. Multilevel uncovertebral and facet arthrosis is  present resulting in mild left neuroforaminal narrowing at C3-4 and C5-6. The spinal canal is widely patent. Upper chest: Unremarkable Other: None IMPRESSION: No acute intracranial abnormality.  No calvarial fracture. No acute fracture or listhesis of the cervical spine. Electronically Signed   By: Helyn Numbers MD   On: 06/11/2020 23:03   CT Cervical Spine Wo Contrast  Result Date: 06/11/2020 CLINICAL DATA:  Head injury, neck pain EXAM: CT HEAD WITHOUT CONTRAST CT CERVICAL SPINE WITHOUT CONTRAST TECHNIQUE: Multidetector CT imaging of the head and cervical spine was performed following the standard protocol without intravenous contrast. Multiplanar CT image reconstructions of the cervical spine were also generated. COMPARISON:  None. FINDINGS: CT HEAD FINDINGS Brain: Normal anatomic configuration. Parenchymal volume loss is commensurate with the patient's age. Mild periventricular white matter changes are present likely reflecting the sequela of small vessel ischemia. 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 No abnormal intra or extra-axial mass lesion or fluid collection. No abnormal mass effect or midline shift. No evidence of acute intracranial hemorrhage or infarct. Ventricular size is normal. Cerebellum unremarkable. Vascular: No asymmetric hyperdense vasculature at the skull base. Skull: Intact Sinuses/Orbits: Paranasal sinuses are clear. Orbits are unremarkable. Other: Mastoid air cells and middle ear cavities are clear. CT CERVICAL SPINE FINDINGS Alignment: Mild reversal of the normal cervical lordosis. 2 mm anterolisthesis of C4 upon C5 and C5 upon C6 is likely degenerative in nature. Skull base and vertebrae: The craniocervical junction is unremarkable. The atlantodental interval is normal. No acute fracture of the cervical spine. Soft tissues and spinal canal: No prevertebral fluid or swelling. No visible canal hematoma. Disc levels: There is diffuse intervertebral disc space narrowing  and degenerative disc calcification throughout the cervical spine, most severe at C6-7 in keeping with changes of moderate to severe degenerative disc disease. Vertebral body height has been preserved. Thickening of the prevertebral soft tissues is artifactual and related to kyphotic positioning. Multilevel uncovertebral and facet arthrosis is present resulting in mild left neuroforaminal narrowing at C3-4 and C5-6. The spinal canal is widely patent. Upper chest: Unremarkable Other: None IMPRESSION: No acute intracranial abnormality.  No calvarial fracture. No acute fracture or listhesis of the cervical spine. Electronically Signed   By: Helyn Numbers MD   On: 06/11/2020 23:03    Procedures Procedures   Medications Ordered in ED Medications - No data to display  ED Course  I have reviewed the triage vital signs and the nursing notes.  Pertinent labs & imaging results that were available during my care of the patient were reviewed by me and considered in my medical decision making (see chart for details).    MDM Rules/Calculators/A&P                          *** Final Clinical Impression(s) / ED Diagnoses Final diagnoses:  None    Rx / DC Orders ED Discharge Orders    None

## 2020-06-11 NOTE — Discharge Instructions (Addendum)
Fall Prevention in the Home, Adult Falls can cause injuries and can happen to people of all ages. There are many things you can do to make your home safe and to help prevent falls. Ask for help when making these changes. What actions can I take to prevent falls? General Instructions  Use good lighting in all rooms. Replace any light bulbs that burn out.  Turn on the lights in dark areas. Use night-lights.  Keep items that you use often in easy-to-reach places. Lower the shelves around your home if needed.  Set up your furniture so you have a clear path. Avoid moving your furniture around.  Do not have throw rugs or other things on the floor that can make you trip.  Avoid walking on wet floors.  If any of your floors are uneven, fix them.  Add color or contrast paint or tape to clearly mark and help you see: ? Grab bars or handrails. ? First and last steps of staircases. ? Where the edge of each step is.  If you use a stepladder: ? Make sure that it is fully opened. Do not climb a closed stepladder. ? Make sure the sides of the stepladder are locked in place. ? Ask someone to hold the stepladder while you use it.  Know where your pets are when moving through your home. What can I do in the bathroom?  Keep the floor dry. Clean up any water on the floor right away.  Remove soap buildup in the tub or shower.  Use nonskid mats or decals on the floor of the tub or shower.  Attach bath mats securely with double-sided, nonslip rug tape.  If you need to sit down in the shower, use a plastic, nonslip stool.  Install grab bars by the toilet and in the tub and shower. Do not use towel bars as grab bars.      What can I do in the bedroom?  Make sure that you have a light by your bed that is easy to reach.  Do not use any sheets or blankets for your bed that hang to the floor.  Have a firm chair with side arms that you can use for support when you get dressed. What can I do in  the kitchen?  Clean up any spills right away.  If you need to reach something above you, use a step stool with a grab bar.  Keep electrical cords out of the way.  Do not use floor polish or wax that makes floors slippery. What can I do with my stairs?  Do not leave any items on the stairs.  Make sure that you have a light switch at the top and the bottom of the stairs.  Make sure that there are handrails on both sides of the stairs. Fix handrails that are broken or loose.  Install nonslip stair treads on all your stairs.  Avoid having throw rugs at the top or bottom of the stairs.  Choose a carpet that does not hide the edge of the steps on the stairs.  Check carpeting to make sure that it is firmly attached to the stairs. Fix carpet that is loose or worn. What can I do on the outside of my home?  Use bright outdoor lighting.  Fix the edges of walkways and driveways and fix any cracks.  Remove anything that might make you trip as you walk through a door, such as a raised step or threshold.  Trim any   bushes or trees on paths to your home.  Check to see if handrails are loose or broken and that both sides of all steps have handrails.  Install guardrails along the edges of any raised decks and porches.  Clear paths of anything that can make you trip, such as tools or rocks.  Have leaves, snow, or ice cleared regularly.  Use sand or salt on paths during winter.  Clean up any spills in your garage right away. This includes grease or oil spills. What other actions can I take?  Wear shoes that: ? Have a low heel. Do not wear high heels. ? Have rubber bottoms. ? Feel good on your feet and fit well. ? Are closed at the toe. Do not wear open-toe sandals.  Use tools that help you move around if needed. These include: ? Canes. ? Walkers. ? Scooters. ? Crutches.  Review your medicines with your doctor. Some medicines can make you feel dizzy. This can increase your chance  of falling. Ask your doctor what else you can do to help prevent falls. Where to find more information  Centers for Disease Control and Prevention, [employer]: www.cdc.gov  National Institute on Aging: www.nia.nih.gov Contact a doctor if:  You are afraid of falling at home.  You feel weak, drowsy, or dizzy at home.  You fall at home. Summary  There are many simple things that you can do to make your home safe and to help prevent falls.  Ways to make your home safe include removing things that can make you trip and installing grab bars in the bathroom.  Ask for help when making these changes in your home. This information is not intended to replace advice given to you by your health care provider. Make sure you discuss any questions you have with your health care provider. Document Revised: 10/29/2019 Document Reviewed: 10/29/2019 Elsevier Patient Education  2021 [employer]  

## 2020-06-11 NOTE — ED Triage Notes (Signed)
Pt BIB [facility] from home, pt reports a ground level fall after he ankles became weak. Pt fell back hitting her head, denies LOC or blood thinner use. Per EMS pt initially complained of lower back pain that has now resolved. EMS VS: BP 120/68, HR 96, SpO2 97% room air.

## 2020-06-11 NOTE — ED Notes (Signed)
Patient transported to CT 

## 2020-06-12 ENCOUNTER — Emergency Department (HOSPITAL_COMMUNITY): Payer: Medicare Other

## 2020-06-12 ENCOUNTER — Encounter (HOSPITAL_COMMUNITY): Payer: Self-pay | Admitting: Internal Medicine

## 2020-06-12 DIAGNOSIS — M1712 Unilateral primary osteoarthritis, left knee: Secondary | ICD-10-CM | POA: Diagnosis not present

## 2020-06-12 DIAGNOSIS — I129 Hypertensive chronic kidney disease with stage 1 through stage 4 chronic kidney disease, or unspecified chronic kidney disease: Secondary | ICD-10-CM | POA: Diagnosis not present

## 2020-06-12 DIAGNOSIS — E876 Hypokalemia: Secondary | ICD-10-CM | POA: Diagnosis present

## 2020-06-12 DIAGNOSIS — H9190 Unspecified hearing loss, unspecified ear: Secondary | ICD-10-CM | POA: Diagnosis not present

## 2020-06-12 DIAGNOSIS — Z801 Family history of malignant neoplasm of trachea, bronchus and lung: Secondary | ICD-10-CM | POA: Diagnosis not present

## 2020-06-12 DIAGNOSIS — Z20822 Contact with and (suspected) exposure to covid-19: Secondary | ICD-10-CM | POA: Diagnosis not present

## 2020-06-12 DIAGNOSIS — R6510 Systemic inflammatory response syndrome (SIRS) of non-infectious origin without acute organ dysfunction: Secondary | ICD-10-CM | POA: Diagnosis not present

## 2020-06-12 DIAGNOSIS — M549 Dorsalgia, unspecified: Secondary | ICD-10-CM | POA: Diagnosis not present

## 2020-06-12 DIAGNOSIS — I959 Hypotension, unspecified: Secondary | ICD-10-CM | POA: Diagnosis present

## 2020-06-12 DIAGNOSIS — E871 Hypo-osmolality and hyponatremia: Secondary | ICD-10-CM | POA: Diagnosis present

## 2020-06-12 DIAGNOSIS — Y92009 Unspecified place in unspecified non-institutional (private) residence as the place of occurrence of the external cause: Secondary | ICD-10-CM

## 2020-06-12 DIAGNOSIS — Z885 Allergy status to narcotic agent status: Secondary | ICD-10-CM | POA: Diagnosis not present

## 2020-06-12 DIAGNOSIS — R531 Weakness: Secondary | ICD-10-CM

## 2020-06-12 DIAGNOSIS — W19XXXA Unspecified fall, initial encounter: Secondary | ICD-10-CM

## 2020-06-12 DIAGNOSIS — A419 Sepsis, unspecified organism: Secondary | ICD-10-CM | POA: Diagnosis not present

## 2020-06-12 DIAGNOSIS — N390 Urinary tract infection, site not specified: Secondary | ICD-10-CM | POA: Diagnosis not present

## 2020-06-12 DIAGNOSIS — W1830XA Fall on same level, unspecified, initial encounter: Secondary | ICD-10-CM | POA: Diagnosis not present

## 2020-06-12 DIAGNOSIS — R509 Fever, unspecified: Secondary | ICD-10-CM | POA: Insufficient documentation

## 2020-06-12 DIAGNOSIS — G8929 Other chronic pain: Secondary | ICD-10-CM | POA: Diagnosis not present

## 2020-06-12 DIAGNOSIS — Z96651 Presence of right artificial knee joint: Secondary | ICD-10-CM | POA: Diagnosis not present

## 2020-06-12 DIAGNOSIS — B961 Klebsiella pneumoniae [K. pneumoniae] as the cause of diseases classified elsewhere: Secondary | ICD-10-CM | POA: Diagnosis not present

## 2020-06-12 DIAGNOSIS — K219 Gastro-esophageal reflux disease without esophagitis: Secondary | ICD-10-CM | POA: Diagnosis not present

## 2020-06-12 DIAGNOSIS — S0001XA Abrasion of scalp, initial encounter: Secondary | ICD-10-CM | POA: Diagnosis not present

## 2020-06-12 DIAGNOSIS — Z79899 Other long term (current) drug therapy: Secondary | ICD-10-CM | POA: Diagnosis not present

## 2020-06-12 DIAGNOSIS — Z8249 Family history of ischemic heart disease and other diseases of the circulatory system: Secondary | ICD-10-CM | POA: Diagnosis not present

## 2020-06-12 DIAGNOSIS — I10 Essential (primary) hypertension: Secondary | ICD-10-CM | POA: Diagnosis not present

## 2020-06-12 DIAGNOSIS — J189 Pneumonia, unspecified organism: Secondary | ICD-10-CM | POA: Diagnosis not present

## 2020-06-12 DIAGNOSIS — N1831 Chronic kidney disease, stage 3a: Secondary | ICD-10-CM | POA: Diagnosis not present

## 2020-06-12 LAB — RESP PANEL BY RT-PCR (FLU A&B, COVID) ARPGX2
Influenza A by PCR: NEGATIVE
Influenza B by PCR: NEGATIVE
SARS Coronavirus 2 by RT PCR: NEGATIVE

## 2020-06-12 LAB — COMPREHENSIVE METABOLIC PANEL
ALT: 11 U/L (ref 0–44)
AST: 18 U/L (ref 15–41)
Albumin: 3.4 g/dL — ABNORMAL LOW (ref 3.5–5.0)
Alkaline Phosphatase: 63 U/L (ref 38–126)
Anion gap: 10 (ref 5–15)
BUN: 19 mg/dL (ref 8–23)
CO2: 21 mmol/L — ABNORMAL LOW (ref 22–32)
Calcium: 10.1 mg/dL (ref 8.9–10.3)
Chloride: 103 mmol/L (ref 98–111)
Creatinine, Ser: 1.06 mg/dL — ABNORMAL HIGH (ref 0.44–1.00)
GFR, Estimated: 51 mL/min — ABNORMAL LOW (ref >60)
Glucose, Bld: 123 mg/dL — ABNORMAL HIGH (ref 70–99)
Potassium: 3.3 mmol/L — ABNORMAL LOW (ref 3.5–5.1)
Sodium: 134 mmol/L — ABNORMAL LOW (ref 135–145)
Total Bilirubin: 1.2 mg/dL (ref 0.3–1.2)
Total Protein: 6.4 g/dL — ABNORMAL LOW (ref 6.5–8.1)

## 2020-06-12 LAB — CBC WITH DIFFERENTIAL/PLATELET
Abs Immature Granulocytes: 0.05 10*3/uL (ref 0.00–0.07)
Basophils Absolute: 0 10*3/uL (ref 0.0–0.1)
Basophils Relative: 0 %
Eosinophils Absolute: 0.1 10*3/uL (ref 0.0–0.5)
Eosinophils Relative: 1 %
HCT: 42.2 % (ref 36.0–46.0)
Hemoglobin: 14.3 g/dL (ref 12.0–15.0)
Immature Granulocytes: 1 %
Lymphocytes Relative: 2 %
Lymphs Abs: 0.2 10*3/uL — ABNORMAL LOW (ref 0.7–4.0)
MCH: 31.6 pg (ref 26.0–34.0)
MCHC: 33.9 g/dL (ref 30.0–36.0)
MCV: 93.4 fL (ref 80.0–100.0)
Monocytes Absolute: 0.7 10*3/uL (ref 0.1–1.0)
Monocytes Relative: 7 %
Neutro Abs: 9.8 10*3/uL — ABNORMAL HIGH (ref 1.7–7.7)
Neutrophils Relative %: 89 %
Platelets: 176 10*3/uL (ref 150–400)
RBC: 4.52 MIL/uL (ref 3.87–5.11)
RDW: 12.1 % (ref 11.5–15.5)
WBC: 10.9 10*3/uL — ABNORMAL HIGH (ref 4.0–10.5)
nRBC: 0 % (ref 0.0–0.2)

## 2020-06-12 LAB — URINALYSIS, ROUTINE W REFLEX MICROSCOPIC
Bilirubin Urine: NEGATIVE
Glucose, UA: NEGATIVE mg/dL
Hgb urine dipstick: NEGATIVE
Ketones, ur: 5 mg/dL — AB
Leukocytes,Ua: NEGATIVE
Nitrite: NEGATIVE
Protein, ur: NEGATIVE mg/dL
Specific Gravity, Urine: 1.017 (ref 1.005–1.030)
pH: 5 (ref 5.0–8.0)

## 2020-06-12 LAB — HIV ANTIBODY (ROUTINE TESTING W REFLEX): HIV Screen 4th Generation wRfx: NONREACTIVE

## 2020-06-12 LAB — LACTIC ACID, PLASMA: Lactic Acid, Venous: 1.4 mmol/L (ref 0.5–1.9)

## 2020-06-12 LAB — PROCALCITONIN: Procalcitonin: 0.35 ng/mL

## 2020-06-12 MED ORDER — SODIUM CHLORIDE 0.9 % IV BOLUS
500.0000 mL | INTRAVENOUS | Status: AC
  Administered 2020-06-12: 500 mL via INTRAVENOUS

## 2020-06-12 MED ORDER — SODIUM CHLORIDE 0.9 % IV SOLN
500.0000 mg | Freq: Once | INTRAVENOUS | Status: AC
  Administered 2020-06-12: 500 mg via INTRAVENOUS
  Filled 2020-06-12: qty 500

## 2020-06-12 MED ORDER — POLYVINYL ALCOHOL 1.4 % OP SOLN
1.0000 [drp] | Freq: Every day | OPHTHALMIC | Status: DC
  Administered 2020-06-13: 1 [drp] via OPHTHALMIC
  Filled 2020-06-12: qty 15

## 2020-06-12 MED ORDER — PANTOPRAZOLE SODIUM 40 MG PO TBEC
40.0000 mg | DELAYED_RELEASE_TABLET | Freq: Every day | ORAL | Status: DC
  Administered 2020-06-12 – 2020-06-13 (×2): 40 mg via ORAL
  Filled 2020-06-12 (×2): qty 1

## 2020-06-12 MED ORDER — SODIUM CHLORIDE 0.9 % IV SOLN
1.0000 g | Freq: Once | INTRAVENOUS | Status: AC
  Administered 2020-06-12: 1 g via INTRAVENOUS
  Filled 2020-06-12: qty 10

## 2020-06-12 MED ORDER — POLYETHYL GLYCOL-PROPYL GLYCOL 0.4-0.3 % OP GEL: Freq: Every day | OPHTHALMIC | Status: DC

## 2020-06-12 MED ORDER — ENOXAPARIN SODIUM 40 MG/0.4ML ~~LOC~~ SOLN
40.0000 mg | SUBCUTANEOUS | Status: DC
  Administered 2020-06-12: 40 mg via SUBCUTANEOUS
  Filled 2020-06-12: qty 0.4

## 2020-06-12 MED ORDER — SODIUM CHLORIDE 0.9 % IV BOLUS
1000.0000 mL | Freq: Once | INTRAVENOUS | Status: AC
  Administered 2020-06-12: 1000 mL via INTRAVENOUS

## 2020-06-12 MED ORDER — AZITHROMYCIN 250 MG PO TABS
500.0000 mg | ORAL_TABLET | Freq: Every day | ORAL | Status: DC
  Administered 2020-06-13: 500 mg via ORAL
  Filled 2020-06-12: qty 2

## 2020-06-12 MED ORDER — ACETAMINOPHEN 325 MG PO TABS
650.0000 mg | ORAL_TABLET | Freq: Once | ORAL | Status: AC
  Administered 2020-06-12: 650 mg via ORAL
  Filled 2020-06-12: qty 2

## 2020-06-12 MED ORDER — POTASSIUM CHLORIDE CRYS ER 20 MEQ PO TBCR
40.0000 meq | EXTENDED_RELEASE_TABLET | ORAL | Status: AC
  Administered 2020-06-12: 40 meq via ORAL
  Filled 2020-06-12: qty 2

## 2020-06-12 MED ORDER — SODIUM CHLORIDE 0.9 % IV SOLN
2.0000 g | INTRAVENOUS | Status: DC
  Filled 2020-06-12: qty 20

## 2020-06-12 MED ORDER — SODIUM CHLORIDE 0.9 % IV SOLN: INTRAVENOUS | Status: DC

## 2020-06-12 NOTE — ED Notes (Signed)
Patient unable to stand at this time. Was able to lay and sit.

## 2020-06-12 NOTE — ED Notes (Signed)
Patient transported to X-ray 

## 2020-06-12 NOTE — H&P (Addendum)
History and Physical    Gina [name] WUJ:[id number] DOB: Dec 19, 1932 DOA: 06/11/2020  Referring MD/NP/PA: John Giovanni, MD PCP: Adolph Pollack, FNP  Patient coming from: Home(lives alone) via EMS  Chief Complaint: Fall  I have personally briefly reviewed patient's old medical records in [facility]   HPI: Gina [name] is a 87 y.o. female with medical history significant of hypertension, chronic kidney disease stage IIIa, hard of hearing, and GERD who presents after having a fall yesterday evening around 8pm. Normally she gets around with a cane and is able to complete activities of daily living without assistance.  Patient reports she had gotten up to open the storm door for her daughter when she stated that her legs and feet would not move.  Daughter states it was like her legs just gave out and she fell backwards and hit her head on a wooden table.  She denies having any loss of consciousness.  Her daughter immediately called 911, and with   her husband were able to get the storm door open.  Patient complained of having pain in her back and knees.  She had just recently been diagnosed with a urinary tract infection and started on Macrobid by her PCP 8 days ago.   She completed the medication the other day, but since starting it reported feeling weak and fatigued.  Associated symptoms include congestion, chronic intermittent cough that she relates to acid reflux, chronic left knee pain, and neck pain.  Denies having any palpitations, chest pain, abdominal pain, vomiting, dysuria, or diarrhea.  ED Course: Upon admission to the emergency department patient was noted to be febrile up to 100.6 F, pulse 88-102, respirations 17-36, blood pressures 99/41-120/37, and O2 saturations as low as oxygen. CT of the head and cervical spine negative for any acute abnormalities.  Labs were significant for WBC 10.9, sodium 134, potassium 3.3, BUN 19, and creatinine 1.06.  COVID-19 and influenza  screening were negative.  X-rays of the chest noted low lung volumes with streaky and hazy opacities concerning for atelectasis changes. X-rays of the pelvis and knees did not show any acute fractures.  Patient had been given 1 L of normal saline IV fluids, acetaminophen 650 mg p.o., Rocephin, and azithromycin.  Review of Systems  Constitutional: Positive for malaise/fatigue. Negative for fever.  HENT: Positive for hearing loss. Negative for congestion and nosebleeds.   Eyes: Negative for photophobia and pain.  Respiratory: Positive for cough. Negative for shortness of breath.   Cardiovascular: Positive for chest pain. Negative for leg swelling.  Gastrointestinal: Negative for abdominal pain, nausea and vomiting.  Genitourinary: Negative for dysuria and hematuria.  Musculoskeletal: Positive for back pain, falls, joint pain and neck pain.  Skin: Negative for itching and rash.  Neurological: Positive for weakness. Negative for speech change and loss of consciousness.  Psychiatric/Behavioral: Negative for memory loss and substance abuse.    Past Medical History:  Diagnosis Date  . CKD (chronic kidney disease) stage 3, GFR 30-59 ml/min (HCC)   . GERD (gastroesophageal reflux disease)   . Hypertension     Past Surgical History:  Procedure Laterality Date  . FOOT SURGERY     Partial removal right great toe  . Right knee surgery    . SHOULDER SURGERY Right      reports that she has never smoked. She has never used smokeless tobacco. She reports that she does not drink alcohol and does not use drugs.  Allergies  Allergen Reactions  . Codeine  Other reaction(s): Other (See Comments) Insomnia Insomnia     Family History  Problem Relation Age of Onset  . Heart disease Mother   . Cataracts Mother   . Lung cancer Father     Prior to Admission medications   Medication Sig Start Date End Date Taking? Authorizing Provider  amoxicillin (AMOXIL) 500 MG capsule Take 2,000 mg by  mouth See admin instructions. Prior to  Dental procedure   Yes [provider]  hydrochlorothiazide (HYDRODIURIL) 12.5 MG tablet Take 12.5 mg by mouth every other day. 04/05/20  Yes [provider]  omeprazole (PRILOSEC) 20 MG capsule Take 20 mg by mouth daily. 04/05/20  Yes [provider]  Polyethyl Glycol-Propyl Glycol (SYSTANE OP) Place 1 drop into both eyes daily.   Yes [provider]  valsartan (DIOVAN) 80 MG tablet Take 80 mg by mouth daily. 04/05/20  Yes [provider]  White Petrolatum-Mineral Oil (SYSTANE NIGHTTIME OP) Place 1 drop into both eyes at bedtime.   Yes [provider]  nitrofurantoin, macrocrystal-monohydrate, (MACROBID) 100 MG capsule Take 100 mg by mouth 2 (two) times daily. Patient not taking: No sig reported 06/08/20   [provider]    Physical Exam:  Constitutional: Elderly female currently in no acute distress Vitals:   06/12/20 0255 06/12/20 0300 06/12/20 0330 06/12/20 0530  BP: (!) 111/47 (!) 113/44 (!) 109/44 (!) 106/36  Pulse: 89 92 91 88  Resp: (!) 24 17 (!) 25 (!) 21  Temp:    99.1 F (37.3 C)  TempSrc:    Oral  SpO2: 95% 94% 92% 94%   Eyes: PERRL, lids and conjunctivae normal ENMT: Mucous membranes are moist. Posterior pharynx clear of any exudate or lesions.  Neck: normal, supple, no masses, no thyromegaly Respiratory: clear to auscultation bilaterally, no wheezing, no crackles. Normal respiratory effort. No accessory muscle use.  Cardiovascular: Regular rate and rhythm, no murmurs / rubs / gallops. No extremity edema. 2+ pedal pulses. No carotid bruits.  Abdomen: no tenderness, no masses palpated. No hepatosplenomegaly. Bowel sounds positive.  Musculoskeletal: no clubbing / cyanosis.  Severe crepitation noted of the left knee.  Decreased range of motion of the right shoulder from prior surgery. Skin: Abrasion to posterior scalp  Neurologic: CN 2-12 grossly intact. Sensation intact, DTR  normal. Strength 5/5 in all 4.  Psychiatric: Normal judgment and insight. Alert and oriented x 3. Normal mood.     Labs on Admission: I have personally reviewed following labs and imaging studies  CBC: Recent Labs  Lab 06/12/20 0018  WBC 10.9*  NEUTROABS 9.8*  HGB 14.3  HCT 42.2  MCV 93.4  PLT 176   Basic Metabolic Panel: Recent Labs  Lab 06/12/20 0018  NA 134*  K 3.3*  CL 103  CO2 21*  GLUCOSE 123*  BUN 19  CREATININE 1.06*  CALCIUM 10.1   GFR: CrCl cannot be calculated (Unknown ideal weight.). Liver Function Tests: Recent Labs  Lab 06/12/20 0018  AST 18  ALT 11  ALKPHOS 63  BILITOT 1.2  PROT 6.4*  ALBUMIN 3.4*   No results for input(s): LIPASE, AMYLASE in the last 168 hours. No results for input(s): AMMONIA in the last 168 hours. Coagulation Profile: No results for input(s): INR, PROTIME in the last 168 hours. Cardiac Enzymes: No results for input(s): CKTOTAL, CKMB, CKMBINDEX, TROPONINI in the last 168 hours. BNP (last 3 results) No results for input(s): PROBNP in the last 8760 hours. HbA1C: No results for input(s): HGBA1C in the  last 72 hours. CBG: No results for input(s): GLUCAP in the last 168 hours. Lipid Profile: No results for input(s): CHOL, HDL, LDLCALC, TRIG, CHOLHDL, LDLDIRECT in the last 72 hours. Thyroid Function Tests: No results for input(s): TSH, T4TOTAL, FREET4, T3FREE, THYROIDAB in the last 72 hours. Anemia Panel: No results for input(s): VITAMINB12, FOLATE, FERRITIN, TIBC, IRON, RETICCTPCT in the last 72 hours. Urine analysis:    Component Value Date/Time   COLORURINE YELLOW 07/11/2010 1400   APPEARANCEUR CLOUDY (A) 07/11/2010 1400   LABSPEC 1.020 07/11/2010 1400   PHURINE 6.0 07/11/2010 1400   GLUCOSEU NEGATIVE 07/11/2010 1400   HGBUR NEGATIVE 07/11/2010 1400   BILIRUBINUR NEGATIVE 07/11/2010 1400   KETONESUR 15 (A) 07/11/2010 1400   PROTEINUR NEGATIVE 07/11/2010 1400   UROBILINOGEN 0.2 07/11/2010 1400   NITRITE NEGATIVE  07/11/2010 1400   LEUKOCYTESUR  07/11/2010 1400    NEGATIVE MICROSCOPIC NOT DONE ON URINES WITH NEGATIVE PROTEIN, BLOOD, LEUKOCYTES, NITRITE, OR GLUCOSE <1000 mg/dL.   Sepsis Labs: Recent Results (from the past 240 hour(s))  Resp Panel by RT-PCR (Flu A&B, Covid) Nasopharyngeal Swab     Status: None   Collection Time: 06/12/20  5:25 AM   Specimen: Nasopharyngeal Swab; Nasopharyngeal(NP) swabs in vial transport medium  Result Value Ref Range Status   SARS Coronavirus 2 by RT PCR NEGATIVE NEGATIVE Final    Comment: (NOTE) SARS-CoV-2 target nucleic acids are NOT DETECTED.  The SARS-CoV-2 RNA is generally detectable in upper respiratory specimens during the acute phase of infection. The lowest concentration of SARS-CoV-2 viral copies this assay can detect is 138 copies/mL. A negative result does not preclude SARS-Cov-2 infection and should not be used as the sole basis for treatment or other patient management decisions. A negative result may occur with  improper specimen collection/handling, submission of specimen other than nasopharyngeal swab, presence of viral mutation(s) within the areas targeted by this assay, and inadequate number of viral copies(<138 copies/mL). A negative result must be combined with clinical observations, patient history, and epidemiological information. The expected result is Negative.  Fact Sheet for Patients:  BloggerCourse.com  Fact Sheet for Healthcare Providers:  SeriousBroker.it  This test is no t yet approved or cleared by the [place] FDA and  has been authorized for detection and/or diagnosis of SARS-CoV-2 by FDA under an Emergency Use Authorization (EUA). This EUA will remain  in effect (meaning this test can be used) for the duration of the COVID-19 declaration under Section 564(b)(1) of the Act, 21 U.S.C.section 360bbb-3(b)(1), unless the authorization is terminated  or revoked sooner.        Influenza A by PCR NEGATIVE NEGATIVE Final   Influenza B by PCR NEGATIVE NEGATIVE Final    Comment: (NOTE) The Xpert Xpress SARS-CoV-2/FLU/RSV plus assay is intended as an aid in the diagnosis of influenza from Nasopharyngeal swab specimens and should not be used as a sole basis for treatment. Nasal washings and aspirates are unacceptable for Xpert Xpress SARS-CoV-2/FLU/RSV testing.  Fact Sheet for Patients: BloggerCourse.com  Fact Sheet for Healthcare Providers: SeriousBroker.it  This test is not yet approved or cleared by the [place] FDA and has been authorized for detection and/or diagnosis of SARS-CoV-2 by FDA under an Emergency Use Authorization (EUA). This EUA will remain in effect (meaning this test can be used) for the duration of the COVID-19 declaration under Section 564(b)(1) of the Act, 21 U.S.C. section 360bbb-3(b)(1), unless the authorization is terminated or revoked.  Performed at [facility], [address]  [place], [place] [address]      Radiological Exams on Admission: DG Chest 1 View  Result Date: 06/11/2020 CLINICAL DATA:  Fall EXAM: CHEST  1 VIEW; PELVIS - 1-2 VIEW COMPARISON:  Chest radiograph 07/11/2010 FINDINGS: Chest: Low lung volumes with streaky and hazy opacities, most compatible with atelectatic change. The aorta is calcified. The remaining cardiomediastinal contours are unremarkable for portable technique. Prior right shoulder arthroplasty. High-riding appearance of the humeral head without frank dislocation, most suggestive of rotator cuff insufficiency though if there is concern for acute shoulder injury, consider dedicated radiographs. Additional degenerative changes left shoulder and bilateral acromioclavicular joints. No visible displaced rib fractures or other acute osseous injury is identified in the chest. Pelvis: The osseous structures appear diffusely demineralized which may  limit detection of small or nondisplaced fractures. Bones of the pelvis appear intact and congruent. Femoral heads are normally located. Proximal femora are intact. Soft tissues are free of acute abnormality. IMPRESSION: 1. Low lung volumes with streaky and hazy opacities, most compatible with atelectatic change. 2. High-riding appearance of the humeral head without frank dislocation, most suggestive of rotator cuff insufficiency though if there is concern for acute shoulder injury, consider dedicated radiographs. 3. No other acute abnormality of the chest. 4. No acute osseous abnormality of the pelvis. Electronically Signed   By: Kreg Shropshire M.D.   On: 06/11/2020 23:15   DG Pelvis 1-2 Views  Result Date: 06/11/2020 CLINICAL DATA:  Fall EXAM: CHEST  1 VIEW; PELVIS - 1-2 VIEW COMPARISON:  Chest radiograph 07/11/2010 FINDINGS: Chest: Low lung volumes with streaky and hazy opacities, most compatible with atelectatic change. The aorta is calcified. The remaining cardiomediastinal contours are unremarkable for portable technique. Prior right shoulder arthroplasty. High-riding appearance of the humeral head without frank dislocation, most suggestive of rotator cuff insufficiency though if there is concern for acute shoulder injury, consider dedicated radiographs. Additional degenerative changes left shoulder and bilateral acromioclavicular joints. No visible displaced rib fractures or other acute osseous injury is identified in the chest. Pelvis: The osseous structures appear diffusely demineralized which may limit detection of small or nondisplaced fractures. Bones of the pelvis appear intact and congruent. Femoral heads are normally located. Proximal femora are intact. Soft tissues are free of acute abnormality. IMPRESSION: 1. Low lung volumes with streaky and hazy opacities, most compatible with atelectatic change. 2. High-riding appearance of the humeral head without frank dislocation, most suggestive of rotator  cuff insufficiency though if there is concern for acute shoulder injury, consider dedicated radiographs. 3. No other acute abnormality of the chest. 4. No acute osseous abnormality of the pelvis. Electronically Signed   By: Kreg Shropshire M.D.   On: 06/11/2020 23:15   DG Knee 2 Views Left  Result Date: 06/12/2020 CLINICAL DATA:  Fall EXAM: LEFT KNEE - 1-2 VIEW COMPARISON:  None. FINDINGS: Two view radiograph left knee demonstrates normal alignment. No fracture or dislocation. There is severe tricompartmental degenerative arthritis with joint space narrowing and osteophyte formation identified. No effusion. Soft tissues are unremarkable. IMPRESSION: No acute fracture or dislocation. Electronically Signed   By: Helyn Numbers MD   On: 06/12/2020 01:08   DG Knee 2 Views Right  Result Date: 06/12/2020 CLINICAL DATA:  Fall, knee pain EXAM: RIGHT KNEE - 1-2 VIEW COMPARISON:  None. 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 Normal alignment. No fracture or dislocation. No effusion. Soft tissues are unremarkable. IMPRESSION: No acute fracture or dislocation. Electronically Signed   By: Helyn Numbers MD  On: 06/12/2020 01:07   CT Head Wo Contrast  Result Date: 06/11/2020 CLINICAL DATA:  Head injury, neck pain EXAM: CT HEAD WITHOUT CONTRAST CT CERVICAL SPINE WITHOUT CONTRAST TECHNIQUE: Multidetector CT imaging of the head and cervical spine was performed following the standard protocol without intravenous contrast. Multiplanar CT image reconstructions of the cervical spine were also generated. COMPARISON:  None. FINDINGS: CT HEAD FINDINGS Brain: Normal anatomic configuration. Parenchymal volume loss is commensurate with the patient's age. Mild periventricular white matter changes are present likely reflecting the sequela of small vessel ischemia. 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 No abnormal intra or extra-axial mass lesion or fluid collection. No abnormal mass effect or midline  shift. No evidence of acute intracranial hemorrhage or infarct. Ventricular size is normal. Cerebellum unremarkable. Vascular: No asymmetric hyperdense vasculature at the skull base. Skull: Intact Sinuses/Orbits: Paranasal sinuses are clear. Orbits are unremarkable. Other: Mastoid air cells and middle ear cavities are clear. CT CERVICAL SPINE FINDINGS Alignment: Mild reversal of the normal cervical lordosis. 2 mm anterolisthesis of C4 upon C5 and C5 upon C6 is likely degenerative in nature. Skull base and vertebrae: The craniocervical junction is unremarkable. The atlantodental interval is normal. No acute fracture of the cervical spine. Soft tissues and spinal canal: No prevertebral fluid or swelling. No visible canal hematoma. Disc levels: There is diffuse intervertebral disc space narrowing and degenerative disc calcification throughout the cervical spine, most severe at C6-7 in keeping with changes of moderate to severe degenerative disc disease. Vertebral body height has been preserved. Thickening of the prevertebral soft tissues is artifactual and related to kyphotic positioning. Multilevel uncovertebral and facet arthrosis is present resulting in mild left neuroforaminal narrowing at C3-4 and C5-6. The spinal canal is widely patent. Upper chest: Unremarkable Other: None IMPRESSION: No acute intracranial abnormality.  No calvarial fracture. No acute fracture or listhesis of the cervical spine. Electronically Signed   By: Helyn NumbersAshesh  Parikh MD   On: 06/11/2020 23:03   CT Cervical Spine Wo Contrast  Result Date: 06/11/2020 CLINICAL DATA:  Head injury, neck pain EXAM: CT HEAD WITHOUT CONTRAST CT CERVICAL SPINE WITHOUT CONTRAST TECHNIQUE: Multidetector CT imaging of the head and cervical spine was performed following the standard protocol without intravenous contrast. Multiplanar CT image reconstructions of the cervical spine were also generated. COMPARISON:  None. FINDINGS: CT HEAD FINDINGS Brain: Normal anatomic  configuration. Parenchymal volume loss is commensurate with the patient's age. Mild periventricular white matter changes are present likely reflecting the sequela of small vessel ischemia. 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 No abnormal intra or extra-axial mass lesion or fluid collection. No abnormal mass effect or midline shift. No evidence of acute intracranial hemorrhage or infarct. Ventricular size is normal. Cerebellum unremarkable. Vascular: No asymmetric hyperdense vasculature at the skull base. Skull: Intact Sinuses/Orbits: Paranasal sinuses are clear. Orbits are unremarkable. Other: Mastoid air cells and middle ear cavities are clear. CT CERVICAL SPINE FINDINGS Alignment: Mild reversal of the normal cervical lordosis. 2 mm anterolisthesis of C4 upon C5 and C5 upon C6 is likely degenerative in nature. Skull base and vertebrae: The craniocervical junction is unremarkable. The atlantodental interval is normal. No acute fracture of the cervical spine. Soft tissues and spinal canal: No prevertebral fluid or swelling. No visible canal hematoma. Disc levels: There is diffuse intervertebral disc space narrowing and degenerative disc calcification throughout the cervical spine, most severe at C6-7 in keeping with changes of moderate to severe degenerative disc disease. Vertebral body height has been  preserved. Thickening of the prevertebral soft tissues is artifactual and related to kyphotic positioning. Multilevel uncovertebral and facet arthrosis is present resulting in mild left neuroforaminal narrowing at C3-4 and C5-6. The spinal canal is widely patent. Upper chest: Unremarkable Other: None IMPRESSION: No acute intracranial abnormality.  No calvarial fracture. No acute fracture or listhesis of the cervical spine. Electronically Signed   By: Helyn Numbers MD   On: 06/11/2020 23:03    Chest x-ray: Independently reviewed.  Low lung volumes with bilateral hazy  opacities  Assessment/Plan Fever  SIRS: Acute.  Upon admission to the emergency department patient was noted to be febrile up to 100.6 F.  She was also noted to be tachypneic and tachycardic meeting SIRS criteria.  Chest x-ray noted low lung volumes with streaky and hazy opacities suggestive of atelectasis.  Given symptoms patient was empirically started on Rocephin and azithromycin.  Blood cultures were initially ordered.  Question if symptoms possibly secondary to pneumonia/atelectasis vs. UTI ineffectively treated vs. possible drug reaction. -Admit to a MedSurg bed -Check procalcitonin -Add on lactic acid(1.4 which was reassuring) -Continue empiric antibiotics of Rocephin and azithromycin  -Tylenol as needed for fever  Fall secondary to weakness: Acute.  Patient reports having progressively worsening fatigue and weakness after being started on Macrobid for treatment of a urinary tract infection.  Suspect fall is possibly related to dehydration in setting of antibiotics.  Imaging studies did not show any acute intercranial bleed, but patient did suffer abrasion to the posterior scalp. -PT/OT to evaluate and treat  Hypotension: Acute.  Blood pressures initially as low as 95/37.  Blood pressures improved somewhat with initial 1 L bolus of normal saline IV fluids. home medications include hydrochlorothiazide 12.5 mg every other day and valsartan 80 mg daily -Hold home blood pressure medications -Goal MAP greater than 65 -Bolus additional 500 mL normal saline IV fluids and then placed on a rate of 100 mL/h  Klebsiella pneumonia urinary tract infection: Prior to admission.  Patient had been diagnosed with a UTI by PCP on 2/25 and had been treated with a 5-day course of Macrobid.  Urine culture was positive for greater than 100,000 and cfu/ml of Klebsiella pneumonia ESBL for which it appears UTI was susceptible to nitrofurantoin and Rocephin. -Follow-up urinalysis -Antibiotics as seen above    Leukocytosis: WBC elevated at 10.9.  Would suspect more likely related to vaccine given fever.  -Recheck CBC in a.m.  Hypokalemia and hyponatremia: Acute.  Potassium 3.3 and sodium 134.  She is on a diuretic which likely contributed to her symptoms given she was also recently on antibiotics. -Give 40 mEq of potassium chloride x1 dose -Continue to monitor and replace as needed  Chronic kidney disease stage IIIa: Patient presents with creatinine 1.06 with BUN 19.  Review of records from [facility] note baseline GFR noted to be around 44. -Continue to monitor kidney fine  Osteoarthritis of the left knee: Patient noted to have severe crepitation on the left knee.  X-rays of the left knee revealed severe tricompartment compartment degenerative changes.  Patient reports needing to have left knee replaced. -Recommend outpatient follow-up with orthopedic   DVT prophylaxis: Lovenox Code Status: Full Family Communication: Daughter updated over the phone Disposition Plan: To be determined Consults called: None  Admission status: Inpatient, require more than 2 midnight stay for IV antibiotics   Clydie Braun MD [facility]   If 7PM-7AM, please contact night-coverage   06/12/2020, 7:04 AM

## 2020-06-12 NOTE — ED Notes (Signed)
Sat patient up in bed and breakfast tray given.

## 2020-06-12 NOTE — ED Notes (Signed)
Patient c/o back soreness, placed patient in hospital bed, daughter at bedside going home to sleep Marylu Lund [phone]

## 2020-06-12 NOTE — ED Provider Notes (Addendum)
87 year old female received a signout from Dr. Rachael Darby, resident, to the supervision of Dr. Stevie Kern pending reevaluation after labs and imaging.  Per her HPI:  "Daughter went to her moms house today with her husband to adjust the temperature in her home. Pt has been taking antibiotics for a UTI that were given to her by her PCP. Daughter states she noticed her mom had been shuffling when she walked. Pt lives alone and is independent.   Daughter witness her mom fall today outside the locked door. Pt's daughter and son-in-law had to break into her mom's home as the heavy duty lock was on. This took about 10 minutes. The pt continued to talk to her daughter. There was no LOC. The pt did hit her head on the wooden table during the fall and injuring her scalp. Pt says she felt weak yesterday and blames the antibiotic. Daughter did not notice any difference in her mom yesterday.   Denies cough, fever, dysuria, chest pain, shortness of breath, back pain, abdominal pain, vomiting, diarrhea. Has chronic left knee pain and neck pain. Endorses back pain since the fall."   On my evaluation:  The patient's daughter reports that she was started on antibiotics for UTI on 2/25.  The patient denies any dysuria, hematuria, or urinary frequency or hesitancy, but the patient's daughter reports that she was asymptomatic at the time that she was diagnosed with UTI.  Patient's urine culture grew ESBL Klebsiella pneumoniae.   The patient has had a cough, but her daughter states that this is chronic.  The patient's daughter originally went to her home yesterday to check on the patient after she received a call from her mother saying that she felt very cold and it seemed as if the heat was not working.  Otherwise, she denies any recent fevers or chills.  The patient lives alone.  She drives.  She is responsible for her own ADLs at baseline.  She is awaiting a knee replacement of her left knee.  She has a history of a right  knee replacement.   Physical Exam  BP (!) 113/51 (BP Location: Left Arm)   Pulse 93   Temp 99.6 F (37.6 C) (Oral)   Resp 19   Ht 5\' 3"  (1.6 m)   Wt 76.7 kg   SpO2 95%   BMI 29.94 kg/m   Physical Exam Vitals and nursing note reviewed.  Constitutional:      Appearance: She is well-developed and well-nourished.     Comments: Elderly female in no acute distress.  Witty.  Participates in the conversation without difficulty.  HENT:     Head: Normocephalic and atraumatic.     Mouth/Throat:     Mouth: Mucous membranes are dry.  Eyes:     Extraocular Movements: Extraocular movements intact.     Pupils: Pupils are equal, round, and reactive to light.  Cardiovascular:     Rate and Rhythm: Normal rate.     Pulses: Normal pulses.  Pulmonary:     Effort: Pulmonary effort is normal.     Comments: Oxygen saturation noted to be initially 91% with good Pleth on the monitor on room air.  This then decreased to 90% and was at 89% for several minutes with good Pleth before increasing into the low 90s again.  Mild tachypnea.  No rhonchi or rales no accessory muscle use.  No retractions. Abdominal:     Palpations: There is no mass.     Tenderness: There is no abdominal  tenderness. There is no right CVA tenderness, left CVA tenderness, guarding or rebound.     Hernia: No hernia is present.  Musculoskeletal:        General: Normal range of motion.     Cervical back: Normal range of motion.     Right lower leg: No edema.     Left lower leg: No edema.     Comments: No focal tenderness palpation to the bilateral knees, hips, ankles.  No shortening or rotation of the bilateral lower extremities.  Neurovascular intact to the bilateral lower extremities.  Neurological:     Mental Status: She is alert and oriented to person, place, and time.     ED Course/Procedures     Procedures  MDM   87 year old female received a signout from Dr. Rachael Darby, resident, under the supervision of Dr. Stevie Kern  pending labs and knee x-ray.  In brief, the patient was seen for a mechanical fall earlier today.  Work-up for the fall was unremarkable, but patient was unable to ambulate prior to discharge.  Please see her note for further work-up and medical decision making.  Patient initially afebrile, but developed a fever of 100.6 with associated minimal tachycardia in the low 100s.  Oxygen saturation on arrival was 92%, but remained in the low 90s.  Patient has no underlying pulmonary disease.  On my evaluation, oxygen saturation was 91%, but she desatted to 89% on room air for several minutes.  Normotensive.  She has mild tachypnea.  Labs and imaging have been reviewed and independently interpreted by me.  Labs with a mild leukocytosis of 10.9.  Bicarb is mildly decreased to 21.  No metabolic derangements.  COVID-19 test is pending.  UA is also pending and patient did have a urine culture from 1 week ago that did grow ESBL Klebsiella pneumoniae and has been compliant with her home antibiotics.  She has no abdominal pain at this time and denies any urinary complaints.  However, this could be the source of her fever, but I am suspicious that she may have an underlying pneumonia as her chest x-ray does demonstrate some atelectatic changes as well as low lung volumes and streaky, hazy opacities in the bilateral lung bases, especially since she has new intermittent hypoxia.  Will give Rocephin and azithromycin.  After reevaluating the patient, severe sepsis and/or septic shock have been ruled out at this time.  We will plan for admission for pneumonia. Patient would likely benefit from PT OT consult for reassessment since she was unable to walk.  She does also appear dry on exam and IV fluids have been given.    I updated the patient and her daughter with this plan at bedside and they were agreeable.  Consult to the hospitalist team and Dr. Loney Loh will accept the patient for admission.  The patient appears  reasonably stabilized for admission considering the current resources, flow, and capabilities available in the ED at this time, and I doubt any other [facility] requiring further screening and/or treatment in the ED prior to admission.  ADDENDUM: MDM regarding sepsis   Barkley Boards, PA-C 06/12/20 0731    Little, Ambrose Finland, MD 06/12/20 2010    Camey Edell A, PA-C 07/21/20 7846    Clarene Duke, Ambrose Finland, MD 07/22/20 [phone]

## 2020-06-13 DIAGNOSIS — J189 Pneumonia, unspecified organism: Secondary | ICD-10-CM | POA: Diagnosis not present

## 2020-06-13 DIAGNOSIS — A419 Sepsis, unspecified organism: Secondary | ICD-10-CM | POA: Diagnosis present

## 2020-06-13 DIAGNOSIS — R509 Fever, unspecified: Secondary | ICD-10-CM | POA: Diagnosis not present

## 2020-06-13 LAB — CBC WITH DIFFERENTIAL/PLATELET
Abs Immature Granulocytes: 0.07 10*3/uL (ref 0.00–0.07)
Basophils Absolute: 0 10*3/uL (ref 0.0–0.1)
Basophils Relative: 0 %
Eosinophils Absolute: 0.2 10*3/uL (ref 0.0–0.5)
Eosinophils Relative: 2 %
HCT: 38.1 % (ref 36.0–46.0)
Hemoglobin: 13.7 g/dL (ref 12.0–15.0)
Immature Granulocytes: 1 %
Lymphocytes Relative: 2 %
Lymphs Abs: 0.2 10*3/uL — ABNORMAL LOW (ref 0.7–4.0)
MCH: 32.8 pg (ref 26.0–34.0)
MCHC: 36 g/dL (ref 30.0–36.0)
MCV: 91.1 fL (ref 80.0–100.0)
Monocytes Absolute: 0.6 10*3/uL (ref 0.1–1.0)
Monocytes Relative: 6 %
Neutro Abs: 9 10*3/uL — ABNORMAL HIGH (ref 1.7–7.7)
Neutrophils Relative %: 89 %
Platelets: 140 10*3/uL — ABNORMAL LOW (ref 150–400)
RBC: 4.18 MIL/uL (ref 3.87–5.11)
RDW: 12.4 % (ref 11.5–15.5)
WBC: 10.1 10*3/uL (ref 4.0–10.5)
nRBC: 0 % (ref 0.0–0.2)

## 2020-06-13 LAB — BASIC METABOLIC PANEL
Anion gap: 10 (ref 5–15)
BUN: 16 mg/dL (ref 8–23)
CO2: 18 mmol/L — ABNORMAL LOW (ref 22–32)
Calcium: 8.8 mg/dL — ABNORMAL LOW (ref 8.9–10.3)
Chloride: 106 mmol/L (ref 98–111)
Creatinine, Ser: 0.98 mg/dL (ref 0.44–1.00)
GFR, Estimated: 57 mL/min — ABNORMAL LOW (ref >60)
Glucose, Bld: 82 mg/dL (ref 70–99)
Potassium: 3.5 mmol/L (ref 3.5–5.1)
Sodium: 134 mmol/L — ABNORMAL LOW (ref 135–145)

## 2020-06-13 MED ORDER — CEFDINIR 300 MG PO CAPS: 300.0000 mg | ORAL_CAPSULE | Freq: Two times a day (BID) | ORAL | 0 refills | Status: AC

## 2020-06-13 NOTE — TOC Transition Note (Addendum)
Transition of Care [facility]) - CM/SW Discharge Note   Patient Details  Name: Gina [name] MRN: [id number] Date of Birth: 01/06/1933  [facility] CM/SW Contact:  Lawerance Sabal, RN Phone Number: 06/13/2020, 12:32 PM   Clinical Narrative:   [name] w patient's daughter.  Discussed plan for DC. Reviewed HH providers. She would like [facility], referral placed. RW and 3/1 to be delivered to unit for patient to take home by [facility].   [facility] unable to take, referral made to [facility], accepted    Final next level of care: Home w Home Health Services Barriers to Discharge: No Barriers Identified   Patient Goals and CMS Choice Patient states their goals for this hospitalization and ongoing recovery are:: to go home CMS Medicare.gov Compare Post Acute Care list provided to:: Other (Comment Required) Choice offered to / list presented to : Adult Children  Discharge Placement                       Discharge Plan and Services                DME Arranged: 3-N-1,Walker rolling DME Agency: [facility] Date DME Agency Contacted: 06/13/20 Time DME Agency Contacted: [phone] Representative spoke with at DME Agency: [facility] HH Arranged: PT,OT HH Agency: [facility] Date [facility] Agency Contacted: 06/13/20 Time HH Agency Contacted: 1231 Representative spoke with at [facility] Agency: [facility]  Social Determinants of Health (SDOH) Interventions     Readmission Risk Interventions No flowsheet data found.

## 2020-06-13 NOTE — Discharge Summary (Signed)
Physician Discharge Summary  Gina [name] FHL:[id number] DOB: 1932-06-05 DOA: 06/11/2020  PCP: Adolph Pollack, FNP  Admit date: 06/11/2020 Discharge date: 06/13/2020 30 Day Unplanned Readmission Risk Score   Flowsheet Row ED to Hosp-Admission (Current) from 06/11/2020 in [facility] [facility]  30 Day Unplanned Readmission Risk Score (%) 10.72 Filed at 06/13/2020 0801     This score is the patient's risk of an unplanned readmission within 30 days of being discharged (0 -100%). The score is based on dignosis, age, lab data, medications, orders, and past utilization.   Low:  0-14.9   Medium: 15-21.9   High: 22-29.9   Extreme: 30 and above         Admitted From: Home Disposition: Home  Recommendations for Outpatient Follow-up:  1. Follow up with PCP in 1-2 weeks 2. Please obtain BMP/CBC in one week 3. Please follow up with your PCP on the following pending results: Unresulted Labs (From admission, onward)         None        Home Health: None Equipment/Devices: Commode and walker  Discharge Condition: Stable CODE STATUS: Full code Diet recommendation: Cardiac  Subjective: Seen and examined. Patient fully alert and oriented, sitting in the chair. No complaints. Denied any chest pain or shortness of breath. Not hypoxic.  HPI: Gina [name] is a 87 y.o. female with medical history significant of hypertension, chronic kidney disease stage IIIa, hard of hearing, and GERD who presents after having a fall yesterday evening around 8pm. Normally she gets around with a cane and is able to complete activities of daily living without assistance.  Patient reports she had gotten up to open the storm door for her daughter when she stated that her legs and feet would not move.  Daughter states it was like her legs just gave out and she fell backwards and hit her head on a wooden table.  She denies having any loss of consciousness.  Her daughter immediately called 911, and with   her  husband were able to get the storm door open.  Patient complained of having pain in her back and knees.  She had just recently been diagnosed with a urinary tract infection and started on Macrobid by her PCP 8 days ago.   She completed the medication the other day, but since starting it reported feeling weak and fatigued.  Associated symptoms include congestion, chronic intermittent cough that she relates to acid reflux, chronic left knee pain, and neck pain.  Denies having any palpitations, chest pain, abdominal pain, vomiting, dysuria, or diarrhea.  ED Course: Upon admission to the emergency department patient was noted to be febrile up to 100.6 F, pulse 88-102, respirations 17-36, blood pressures 99/41-120/37, and O2 saturations as low as oxygen. CT of the head and cervical spine negative for any acute abnormalities.  Labs were significant for WBC 10.9, sodium 134, potassium 3.3, BUN 19, and creatinine 1.06.  COVID-19 and influenza screening were negative.  X-rays of the chest noted low lung volumes with streaky and hazy opacities concerning for atelectasis changes. X-rays of the pelvis and knees did not show any acute fractures.  Patient had been given 1 L of normal saline IV fluids, acetaminophen 650 mg p.o., Rocephin, and azithromycin.  Brief/Interim Summary: Patient was admitted due to fall at home, generalized weakness, meeting SIRS criteria and presumed pneumonia. When seen this morning, patient is fully alert and oriented and without any symptoms at all. She has not been hypoxic. Her  leukocytosis resolved. No more tachycardia or tachypnea or any fever. Procalcitonin at 0.38. This indicates possibility of pneumonia as was suspected on the chest x-ray. With tachycardia and tachypnea, she meets criteria for sepsis secondary to pneumonia. All sepsis parameters have resolved. Patient's lungs are clear to auscultation. Due to weakness, she was also evaluated by PT and they had opined that patient is  independent at this point in time and recommended only outpatient PT. They however recommended that someone lives with the patient for at least first couple of days from today and her daughter Gina [name] has agreed to live with her. I had seen patient earlier when daughter was not available. Daughter then requested to talk to me. I talked to her at the bedside. Daughter was very happy to see significant improvement in her mother but at the same time was apprehensive about her being weak and at risk of fall again. I did reassure her that her mother has improved significantly and she was also assessed by PT in detail and that her mother has walked in the halls by herself without any weakness noted. We also went through all the pathophysiology of pneumonia and how it can cause weakness in elderly patients. Daughter was convinced and agreeable for discharge. Few minutes later, I received another message from the nurse that daughter still remains concerned about " what if she feels weak again, but if her legs buckled up again". I talked to [place] over the phone once again. She was agreeable with the discharge plan.  Discharge Diagnoses:  Active Problems:   Fall at home, initial encounter   Hypotension   Hypokalemia   Hyponatremia   Weakness   Sepsis (HCC)   CAP (community acquired pneumonia)    Discharge Instructions   Allergies as of 06/13/2020      Reactions   Codeine    Other reaction(s): Other (See Comments) Insomnia Insomnia      Medication List    STOP taking these medications   amoxicillin 500 MG capsule Commonly known as: AMOXIL   nitrofurantoin (macrocrystal-monohydrate) 100 MG capsule Commonly known as: MACROBID     TAKE these medications   cefdinir 300 MG capsule Commonly known as: OMNICEF Take 1 capsule (300 mg total) by mouth 2 (two) times daily for 5 days.   hydrochlorothiazide 12.5 MG tablet Commonly known as: HYDRODIURIL Take 12.5 mg by mouth every other day.    omeprazole 20 MG capsule Commonly known as: PRILOSEC Take 20 mg by mouth daily.   SYSTANE NIGHTTIME OP Place 1 drop into both eyes at bedtime.   SYSTANE OP Place 1 drop into both eyes daily.   valsartan 80 MG tablet Commonly known as: DIOVAN Take 80 mg by mouth daily.            Durable Medical Equipment  (From admission, onward)         Start     Ordered   06/13/20 1104  For home use only DME Bedside commode  Once       Question:  Patient needs a bedside commode to treat with the following condition  Answer:  Generalized weakness   06/13/20 1103   06/13/20 1103  For home use only DME 4 wheeled rolling walker with seat  Once       Question:  Patient needs a walker to treat with the following condition  Answer:  Generalized weakness   06/13/20 1103          Follow-up Information  Garth Schlatter, MD In 2 days.   Specialty: Family Medicine Contact information: [facility] [address] [place] [address] [phone]        Garth Schlatter, MD In 3 days.   Specialty: Family Medicine Contact information: [facility] [address] [place] [address] [phone]        Adolph Pollack, FNP Follow up in 1 week(s).   Specialty: Family Medicine Contact information: [address] [place] [address] [phone]              Allergies  Allergen Reactions  . Codeine     Other reaction(s): Other (See Comments) Insomnia Insomnia     Consultations: None   Procedures/Studies: DG Chest 1 View  Result Date: 06/11/2020 CLINICAL DATA:  Fall EXAM: CHEST  1 VIEW; PELVIS - 1-2 VIEW COMPARISON:  Chest radiograph 07/11/2010 FINDINGS: Chest: Low lung volumes with streaky and hazy opacities, most compatible with atelectatic change. The aorta is calcified. The remaining cardiomediastinal contours are unremarkable for portable technique. Prior right shoulder arthroplasty.  High-riding appearance of the humeral head without frank dislocation, most suggestive of rotator cuff insufficiency though if there is concern for acute shoulder injury, consider dedicated radiographs. Additional degenerative changes left shoulder and bilateral acromioclavicular joints. No visible displaced rib fractures or other acute osseous injury is identified in the chest. Pelvis: The osseous structures appear diffusely demineralized which may limit detection of small or nondisplaced fractures. Bones of the pelvis appear intact and congruent. Femoral heads are normally located. Proximal femora are intact. Soft tissues are free of acute abnormality. IMPRESSION: 1. Low lung volumes with streaky and hazy opacities, most compatible with atelectatic change. 2. High-riding appearance of the humeral head without frank dislocation, most suggestive of rotator cuff insufficiency though if there is concern for acute shoulder injury, consider dedicated radiographs. 3. No other acute abnormality of the chest. 4. No acute osseous abnormality of the pelvis. Electronically Signed   By: Kreg Shropshire M.D.   On: 06/11/2020 23:15   DG Pelvis 1-2 Views  Result Date: 06/11/2020 CLINICAL DATA:  Fall EXAM: CHEST  1 VIEW; PELVIS - 1-2 VIEW COMPARISON:  Chest radiograph 07/11/2010 FINDINGS: Chest: Low lung volumes with streaky and hazy opacities, most compatible with atelectatic change. The aorta is calcified. The remaining cardiomediastinal contours are unremarkable for portable technique. Prior right shoulder arthroplasty. High-riding appearance of the humeral head without frank dislocation, most suggestive of rotator cuff insufficiency though if there is concern for acute shoulder injury, consider dedicated radiographs. Additional degenerative changes left shoulder and bilateral acromioclavicular joints. No visible displaced rib fractures or other acute osseous injury is identified in the chest. Pelvis: The osseous structures  appear diffusely demineralized which may limit detection of small or nondisplaced fractures. Bones of the pelvis appear intact and congruent. Femoral heads are normally located. Proximal femora are intact. Soft tissues are free of acute abnormality. IMPRESSION: 1. Low lung volumes with streaky and hazy opacities, most compatible with atelectatic change. 2. High-riding appearance of the humeral head without frank dislocation, most suggestive of rotator cuff insufficiency though if there is concern for acute shoulder injury, consider dedicated radiographs. 3. No other acute abnormality of the chest. 4. No acute osseous abnormality of the pelvis. Electronically Signed   By: Kreg Shropshire M.D.   On: 06/11/2020 23:15   DG Knee 2 Views Left  Result Date: 06/12/2020 CLINICAL DATA:  Fall EXAM: LEFT KNEE -  1-2 VIEW COMPARISON:  None. FINDINGS: Two view radiograph left knee demonstrates normal alignment. No fracture or dislocation. There is severe tricompartmental degenerative arthritis with joint space narrowing and osteophyte formation identified. No effusion. Soft tissues are unremarkable. IMPRESSION: No acute fracture or dislocation. Electronically Signed   By: Helyn Numbers MD   On: 06/12/2020 01:08   DG Knee 2 Views Right  Result Date: 06/12/2020 CLINICAL DATA:  Fall, knee pain EXAM: RIGHT KNEE - 1-2 VIEW COMPARISON:  None. 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 Normal alignment. No fracture or dislocation. No effusion. Soft tissues are unremarkable. IMPRESSION: No acute fracture or dislocation. Electronically Signed   By: Helyn Numbers MD   On: 06/12/2020 01:07   CT Head Wo Contrast  Result Date: 06/11/2020 CLINICAL DATA:  Head injury, neck pain EXAM: CT HEAD WITHOUT CONTRAST CT CERVICAL SPINE WITHOUT CONTRAST TECHNIQUE: Multidetector CT imaging of the head and cervical spine was performed following the standard protocol without intravenous contrast. Multiplanar CT image reconstructions  of the cervical spine were also generated. COMPARISON:  None. FINDINGS: CT HEAD FINDINGS Brain: Normal anatomic configuration. Parenchymal volume loss is commensurate with the patient's age. Mild periventricular white matter changes are present likely reflecting the sequela of small vessel ischemia. 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 No abnormal intra or extra-axial mass lesion or fluid collection. No abnormal mass effect or midline shift. No evidence of acute intracranial hemorrhage or infarct. Ventricular size is normal. Cerebellum unremarkable. Vascular: No asymmetric hyperdense vasculature at the skull base. Skull: Intact Sinuses/Orbits: Paranasal sinuses are clear. Orbits are unremarkable. Other: Mastoid air cells and middle ear cavities are clear. CT CERVICAL SPINE FINDINGS Alignment: Mild reversal of the normal cervical lordosis. 2 mm anterolisthesis of C4 upon C5 and C5 upon C6 is likely degenerative in nature. Skull base and vertebrae: The craniocervical junction is unremarkable. The atlantodental interval is normal. No acute fracture of the cervical spine. Soft tissues and spinal canal: No prevertebral fluid or swelling. No visible canal hematoma. Disc levels: There is diffuse intervertebral disc space narrowing and degenerative disc calcification throughout the cervical spine, most severe at C6-7 in keeping with changes of moderate to severe degenerative disc disease. Vertebral body height has been preserved. Thickening of the prevertebral soft tissues is artifactual and related to kyphotic positioning. Multilevel uncovertebral and facet arthrosis is present resulting in mild left neuroforaminal narrowing at C3-4 and C5-6. The spinal canal is widely patent. Upper chest: Unremarkable Other: None IMPRESSION: No acute intracranial abnormality.  No calvarial fracture. No acute fracture or listhesis of the cervical spine. Electronically Signed   By: Helyn Numbers MD   On: 06/11/2020 23:03    CT Cervical Spine Wo Contrast  Result Date: 06/11/2020 CLINICAL DATA:  Head injury, neck pain EXAM: CT HEAD WITHOUT CONTRAST CT CERVICAL SPINE WITHOUT CONTRAST TECHNIQUE: Multidetector CT imaging of the head and cervical spine was performed following the standard protocol without intravenous contrast. Multiplanar CT image reconstructions of the cervical spine were also generated. COMPARISON:  None. FINDINGS: CT HEAD FINDINGS Brain: Normal anatomic configuration. Parenchymal volume loss is commensurate with the patient's age. Mild periventricular white matter changes are present likely reflecting the sequela of small vessel ischemia. 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 No abnormal intra or extra-axial mass lesion or fluid collection. No abnormal mass effect or midline shift. No evidence of acute intracranial hemorrhage or infarct. Ventricular size is normal. Cerebellum unremarkable. Vascular: No asymmetric hyperdense vasculature at the skull  base. Skull: Intact Sinuses/Orbits: Paranasal sinuses are clear. Orbits are unremarkable. Other: Mastoid air cells and middle ear cavities are clear. CT CERVICAL SPINE FINDINGS Alignment: Mild reversal of the normal cervical lordosis. 2 mm anterolisthesis of C4 upon C5 and C5 upon C6 is likely degenerative in nature. Skull base and vertebrae: The craniocervical junction is unremarkable. The atlantodental interval is normal. No acute fracture of the cervical spine. Soft tissues and spinal canal: No prevertebral fluid or swelling. No visible canal hematoma. Disc levels: There is diffuse intervertebral disc space narrowing and degenerative disc calcification throughout the cervical spine, most severe at C6-7 in keeping with changes of moderate to severe degenerative disc disease. Vertebral body height has been preserved. Thickening of the prevertebral soft tissues is artifactual and related to kyphotic positioning. Multilevel uncovertebral and facet arthrosis  is present resulting in mild left neuroforaminal narrowing at C3-4 and C5-6. The spinal canal is widely patent. Upper chest: Unremarkable Other: None IMPRESSION: No acute intracranial abnormality.  No calvarial fracture. No acute fracture or listhesis of the cervical spine. Electronically Signed   By: Ashesh  Parikh MD   On: 06/11/2020 23:03 Helyn Numbers     Discharge Exam: Vitals:   06/12/20 2236 06/13/20 0450  BP: 134/65 (!) 113/51  Pulse: 100 93  Resp: 17 19  Temp: 98.3 F (36.8 C) 99.6 F (37.6 C)  SpO2: 93% 95%   Vitals:   06/12/20 1245 06/12/20 1550 06/12/20 2236 06/13/20 0450  BP: (!) 134/52 (!) 120/58 134/65 (!) 113/51  Pulse: 83 91 100 93  Resp: 20 17 17 19   Temp:  98.7 F (37.1 C) 98.3 F (36.8 C) 99.6 F (37.6 C)  TempSrc:    Oral  SpO2: 97% 95% 93% 95%  Weight:      Height:        General: Pt is alert, awake, not in acute distress Cardiovascular: RRR, S1/S2 +, no rubs, no gallops Respiratory: CTA bilaterally, no wheezing, no rhonchi Abdominal: Soft, NT, ND, bowel sounds + Extremities: no edema, no cyanosis    The results of significant diagnostics from this hospitalization (including imaging, microbiology, ancillary and laboratory) are listed below for reference.     Microbiology: Recent Results (from the past 240 hour(s))  Resp Panel by RT-PCR (Flu A&B, Covid) Nasopharyngeal Swab     Status: None   Collection Time: 06/12/20  5:25 AM   Specimen: Nasopharyngeal Swab; Nasopharyngeal(NP) swabs in vial transport medium  Result Value Ref Range Status   SARS Coronavirus 2 by RT PCR NEGATIVE NEGATIVE Final    Comment: (NOTE) SARS-CoV-2 target nucleic acids are NOT DETECTED.  The SARS-CoV-2 RNA is generally detectable in upper respiratory specimens during the acute phase of infection. The lowest concentration of SARS-CoV-2 viral copies this assay can detect is 138 copies/mL. A negative result does not preclude SARS-Cov-2 infection and should not be used as the sole  basis for treatment or other patient management decisions. A negative result may occur with  improper specimen collection/handling, submission of specimen other than nasopharyngeal swab, presence of viral mutation(s) within the areas targeted by this assay, and inadequate number of viral copies(<138 copies/mL). A negative result must be combined with clinical observations, patient history, and epidemiological information. The expected result is Negative.  Fact Sheet for Patients:  BloggerCourse.comhttps://www.fda.gov/media/152166/download  Fact Sheet for Healthcare Providers:  SeriousBroker.ithttps://www.fda.gov/media/152162/download  This test is no t yet approved or cleared by the [place] FDA and  has been authorized for detection and/or diagnosis of SARS-CoV-2 by FDA  under an Emergency Use Authorization (EUA). This EUA will remain  in effect (meaning this test can be used) for the duration of the COVID-19 declaration under Section 564(b)(1) of the Act, 21 U.S.C.section 360bbb-3(b)(1), unless the authorization is terminated  or revoked sooner.       Influenza A by PCR NEGATIVE NEGATIVE Final   Influenza B by PCR NEGATIVE NEGATIVE Final    Comment: (NOTE) The Xpert Xpress SARS-CoV-2/FLU/RSV plus assay is intended as an aid in the diagnosis of influenza from Nasopharyngeal swab specimens and should not be used as a sole basis for treatment. Nasal washings and aspirates are unacceptable for Xpert Xpress SARS-CoV-2/FLU/RSV testing.  Fact Sheet for Patients: BloggerCourse.com  Fact Sheet for Healthcare Providers: SeriousBroker.it  This test is not yet approved or cleared by the [place] FDA and has been authorized for detection and/or diagnosis of SARS-CoV-2 by FDA under an Emergency Use Authorization (EUA). This EUA will remain in effect (meaning this test can be used) for the duration of the COVID-19 declaration under Section 564(b)(1) of the Act,  21 U.S.C. section 360bbb-3(b)(1), unless the authorization is terminated or revoked.  Performed at [facility], [address] [place], [place] [address]      Labs: BNP (last 3 results) No results for input(s): BNP in the last 8760 hours. Basic Metabolic Panel: Recent Labs  Lab 06/12/20 0018 06/13/20 0137  NA 134* 134*  K 3.3* 3.5  CL 103 106  CO2 21* 18*  GLUCOSE 123* 82  BUN 19 16  CREATININE 1.06* 0.98  CALCIUM 10.1 8.8*   Liver Function Tests: Recent Labs  Lab 06/12/20 0018  AST 18  ALT 11  ALKPHOS 63  BILITOT 1.2  PROT 6.4*  ALBUMIN 3.4*   No results for input(s): LIPASE, AMYLASE in the last 168 hours. No results for input(s): AMMONIA in the last 168 hours. CBC: Recent Labs  Lab 06/12/20 0018 06/13/20 0137  WBC 10.9* 10.1  NEUTROABS 9.8* 9.0*  HGB 14.3 13.7  HCT 42.2 38.1  MCV 93.4 91.1  PLT 176 140*   Cardiac Enzymes: No results for input(s): CKTOTAL, CKMB, CKMBINDEX, TROPONINI in the last 168 hours. BNP: Invalid input(s): POCBNP CBG: No results for input(s): GLUCAP in the last 168 hours. D-Dimer No results for input(s): DDIMER in the last 72 hours. Hgb A1c No results for input(s): HGBA1C in the last 72 hours. Lipid Profile No results for input(s): CHOL, HDL, LDLCALC, TRIG, CHOLHDL, LDLDIRECT in the last 72 hours. Thyroid function studies No results for input(s): TSH, T4TOTAL, T3FREE, THYROIDAB in the last 72 hours.  Invalid input(s): FREET3 Anemia work up No results for input(s): VITAMINB12, FOLATE, FERRITIN, TIBC, IRON, RETICCTPCT in the last 72 hours. Urinalysis    Component Value Date/Time   COLORURINE AMBER (A) 06/12/2020 0946   APPEARANCEUR HAZY (A) 06/12/2020 0946   LABSPEC 1.017 06/12/2020 0946   PHURINE 5.0 06/12/2020 0946   GLUCOSEU NEGATIVE 06/12/2020 0946   HGBUR NEGATIVE 06/12/2020 0946   BILIRUBINUR NEGATIVE 06/12/2020 0946   KETONESUR 5 (A) 06/12/2020 0946   PROTEINUR NEGATIVE 06/12/2020 0946   UROBILINOGEN  0.2 07/11/2010 1400   NITRITE NEGATIVE 06/12/2020 0946   LEUKOCYTESUR NEGATIVE 06/12/2020 0946   Sepsis Labs Invalid input(s): PROCALCITONIN,  WBC,  LACTICIDVEN Microbiology Recent Results (from the past 240 hour(s))  Resp Panel by RT-PCR (Flu A&B, Covid) Nasopharyngeal Swab     Status: None   Collection Time: 06/12/20  5:25 AM   Specimen: Nasopharyngeal Swab; Nasopharyngeal(NP) swabs  in vial transport medium  Result Value Ref Range Status   SARS Coronavirus 2 by RT PCR NEGATIVE NEGATIVE Final    Comment: (NOTE) SARS-CoV-2 target nucleic acids are NOT DETECTED.  The SARS-CoV-2 RNA is generally detectable in upper respiratory specimens during the acute phase of infection. The lowest concentration of SARS-CoV-2 viral copies this assay can detect is 138 copies/mL. A negative result does not preclude SARS-Cov-2 infection and should not be used as the sole basis for treatment or other patient management decisions. A negative result may occur with  improper specimen collection/handling, submission of specimen other than nasopharyngeal swab, presence of viral mutation(s) within the areas targeted by this assay, and inadequate number of viral copies(<138 copies/mL). A negative result must be combined with clinical observations, patient history, and epidemiological information. The expected result is Negative.  Fact Sheet for Patients:  BloggerCourse.com  Fact Sheet for Healthcare Providers:  SeriousBroker.it  This test is no t yet approved or cleared by the [place] FDA and  has been authorized for detection and/or diagnosis of SARS-CoV-2 by FDA under an Emergency Use Authorization (EUA). This EUA will remain  in effect (meaning this test can be used) for the duration of the COVID-19 declaration under Section 564(b)(1) of the Act, 21 U.S.C.section 360bbb-3(b)(1), unless the authorization is terminated  or revoked sooner.        Influenza A by PCR NEGATIVE NEGATIVE Final   Influenza B by PCR NEGATIVE NEGATIVE Final    Comment: (NOTE) The Xpert Xpress SARS-CoV-2/FLU/RSV plus assay is intended as an aid in the diagnosis of influenza from Nasopharyngeal swab specimens and should not be used as a sole basis for treatment. Nasal washings and aspirates are unacceptable for Xpert Xpress SARS-CoV-2/FLU/RSV testing.  Fact Sheet for Patients: BloggerCourse.com  Fact Sheet for Healthcare Providers: SeriousBroker.it  This test is not yet approved or cleared by the [place] FDA and has been authorized for detection and/or diagnosis of SARS-CoV-2 by FDA under an Emergency Use Authorization (EUA). This EUA will remain in effect (meaning this test can be used) for the duration of the COVID-19 declaration under Section 564(b)(1) of the Act, 21 U.S.C. section 360bbb-3(b)(1), unless the authorization is terminated or revoked.  Performed at [facility], [address] [address], [place], [place] [address]      Time coordinating discharge: Over 30 minutes  SIGNED:   Hughie Closs, MD  [facility] 06/13/2020, 11:08 AM  If 7PM-7AM, please contact night-coverage [email or web address]

## 2020-06-13 NOTE — Evaluation (Signed)
Occupational Therapy Evaluation Patient Details Name: Gina [name] MRN: [id number] DOB: 12/11/1932 Today's Date: 06/13/2020    History of Present Illness Pt is an 87 y.o. female who presented to the ED on 06/11/20 following a fall in which she hit her head but had no LOC. She has been taking antiobiotics for a UTI lately and has since felt weak. Imaging of cervical spine, head, pelvis, bil knees, and shoulder were negative for fxs or acute abnormalities. PMH: HTN, CKD stage 3, GERD, and HOH.   Clinical Impression   Pt admitted with the above diagnoses and presents with below problem list. Pt will benefit from continued acute OT to address the below listed deficits and maximize independence with basic ADLs prior to d/c home. PTA pt was mod I with ADLs. Pt needed min A +2 for sit<>stand transfers, did improve some on subsequent trials. Min guard A to walk in the room utilizing rw. Fatigued at end of session.      Follow Up Recommendations  Outpatient OT;Supervision - Intermittent    Equipment Recommendations  3 in 1 bedside commode    Recommendations for Other Services       Precautions / Restrictions Precautions Precautions: Fall Precaution Comments: HOH Restrictions Weight Bearing Restrictions: No      Mobility Bed Mobility Overal bed mobility: Needs Assistance Bed Mobility: Rolling;Sidelying to Sit Rolling: Mod assist Sidelying to sit: Mod assist;+2 for physical assistance       General bed mobility comments: Pt rolling to L with use of bed rail and cues to push through R foot on bed with minA. Pt rolling to L with modA due to poor initiation, pt stating "this is all I can do" and "I can't do it". ModAx2 to transition to sit with assistance at trunk and legs, cuing pt to push up through arms on bed.    Transfers Overall transfer level: Needs assistance Equipment used: Rolling walker (2 wheeled) Transfers: Sit to/from [employer] Sit to Stand: Min assist;+2  physical assistance;+2 safety/equipment Stand pivot transfers: Min assist       General transfer comment: Sit to stand from EOB > RW 1x with minAx2 to boost and steady pt, cuing pt to push up from bed with 1 hand and hold onto RW with other. Progressed to minAx1 to come to stand from recliner for safety. MinA to manage RW and cue pt for stand step to R bed > recliner.    Balance Overall balance assessment: Needs assistance Sitting-balance support: No upper extremity supported;Feet supported Sitting balance-Leahy Scale: Fair Sitting balance - Comments: Static sitting EOB with supervision.   Standing balance support: Bilateral upper extremity supported;During functional activity Standing balance-Leahy Scale: Poor Standing balance comment: Reliant on bil UEs on RW.                           ADL either performed or assessed with clinical judgement   ADL Overall ADL's : Needs assistance/impaired Eating/Feeding: Set up;Sitting   Grooming: Set up;Sitting   Upper Body Bathing: Set up;Sitting   Lower Body Bathing: Moderate assistance;Minimal assistance;Sit to/from stand;Sitting/lateral leans   Upper Body Dressing : Set up;Sitting   Lower Body Dressing: Minimal assistance;Moderate assistance;Sitting/lateral leans;Sit to/from stand   Toilet Transfer: Minimal assistance;+2 for safety/equipment;Ambulation Toilet Transfer Details (indicate cue type and reason): min A for transfer, min guard with mobility Toileting- Clothing Manipulation and Hygiene: Minimal assistance;Sitting/lateral lean;Sit to/from stand;Set up     Tub/Shower Transfer Details (  indicate cue type and reason): N/A Functional mobility during ADLs: Min guard;Rolling walker General ADL Comments: Pt completed bed level pericare, bed mobility, SPT, rest break then walked bathroom distance. Chair follow provided but not utilized. Fatigued at end of sessoin     Vision         Perception     Praxis       Pertinent Vitals/Pain Pain Assessment: Faces Faces Pain Scale: Hurts a little bit Pain Location: L leg and neck Pain Descriptors / Indicators: Discomfort Pain Intervention(s): Limited activity within patient's tolerance;Monitored during session;Repositioned     Hand Dominance Right   Extremity/Trunk Assessment Upper Extremity Assessment Upper Extremity Assessment: Generalized weakness   Lower Extremity Assessment Lower Extremity Assessment: Defer to PT evaluation   Cervical / Trunk Assessment Cervical / Trunk Assessment: Kyphotic   Communication Communication Communication: HOH   Cognition Arousal/Alertness: Awake/alert Behavior During Therapy: WFL for tasks assessed/performed Overall Cognitive Status: Within Functional Limits for tasks assessed                                 General Comments: A&Ox4.   General Comments       Exercises     Shoulder Instructions      Home Living Family/patient expects to be discharged to:: Private residence Living Arrangements: Alone Available Help at Discharge: Family;Available 24 hours/day Type of Home: House Home Access: Stairs to enter [employer] of Steps: 3 Entrance Stairs-Rails: Can reach both Home Layout: One level     Bathroom Shower/Tub: Tub/shower unit (but does not use it, does bird baths due to fear of falling)   Bathroom Toilet: Standard     Home Equipment: Cane - single point          Prior Functioning/Environment Level of Independence: Independent with assistive device(s)        Comments: Pt mod I with all mobility utilizing SPC. Pt independent with all bathing, dressing, cleaning, and cooking. Her children assist with home repairs.        OT Problem List: Decreased strength;Decreased activity tolerance;Impaired balance (sitting and/or standing);Decreased knowledge of use of DME or AE;Decreased knowledge of precautions;Pain      OT Treatment/Interventions: Self-care/ADL  training;DME and/or AE instruction;Therapeutic activities;Patient/family education;Balance training    OT Goals(Current goals can be found in the care plan section) Acute Rehab OT Goals Patient Stated Goal: to return to her normal OT Goal Formulation: With patient Time For Goal Achievement: 06/27/20 Potential to Achieve Goals: Good ADL Goals Pt Will Perform Lower Body Bathing: with modified independence;sit to/from stand;sitting/lateral leans Pt Will Perform Lower Body Dressing: with modified independence;sitting/lateral leans;sit to/from stand Pt Will Transfer to Toilet: with modified independence;ambulating Pt Will Perform Toileting - Clothing Manipulation and hygiene: with modified independence;sit to/from stand Additional ADL Goal #1: Pt will complete bed mobility at min guard level to prepare for EOB/OOB ADLs.  OT Frequency: Min 2X/week   Barriers to D/C:            Co-evaluation PT/OT/SLP Co-Evaluation/Treatment: Yes Reason for Co-Treatment: For patient/therapist safety;To address functional/ADL transfers   OT goals addressed during session: ADL's and self-care;Proper use of [employer] equipment and DME      AM-PAC OT "6 Clicks" Daily Activity     Outcome Measure Help from another person eating meals?: None Help from another person taking care of personal grooming?: None Help from another person toileting, which includes using toliet, bedpan, or urinal?: A  Little Help from another person bathing (including washing, rinsing, drying)?: A Little Help from another person to put on and taking off regular upper body clothing?: None Help from another person to put on and taking off regular lower body clothing?: A Little 6 Click Score: 21   End of Session Equipment Utilized During Treatment: Gait belt;Rolling walker  Activity Tolerance: Patient limited by fatigue;Patient tolerated treatment well Patient left: in chair;with call bell/phone within reach  OT Visit Diagnosis:  Unsteadiness on feet (R26.81);Muscle weakness (generalized) (M62.81);Pain                Time: 7564-3329 OT Time Calculation (min): 27 min Charges:  OT General Charges $OT Visit: 1 Visit OT Evaluation $OT Eval Moderate Complexity: 1 Mod  Raynald Kemp, OT Acute Rehabilitation Services Pager: [phone] Office: [phone]   Pilar Grammes 06/13/2020, 11:12 AM

## 2020-06-13 NOTE — Evaluation (Signed)
Physical Therapy Evaluation Patient Details Name: Gina [name] MRN: [id number] DOB: [birth year] Today's Date: 06/13/2020   History of Present Illness  Pt is an 87 y.o. female who presented to the ED on 06/11/20 following a fall in which she hit her head but had no LOC. She has been taking antiobiotics for a UTI lately and has since felt weak. Imaging of cervical spine, head, pelvis, bil knees, and shoulder were negative for fxs or acute abnormalities. PMH: HTN, CKD stage 3, GERD, and HOH.  Clinical Impression  Pt presents with condition above and deficits mentioned below, see PT Problem List. PTA, she was living and mod I utilizing a [facility] for all functional mobility. Currently, pt demonstrates generalized weakness, incoordination, and impaired balance that place her at risk for falls. Pt appears to also be limited functionally by her fear of falling as there were moments she stated "I cannot do it" and "this is all I can do", thereby needing encouragement and reassurance of her safety during session. Pt required modAx1-2 for bed mobility but then progressed from needing minAx2 to come to stand and minA to take side steps to the recliner to only needing minAx1 to come to stand from the chair and min guard to ambulate within her room with a RW. Pt would benefit from further RW management training as she tends to push it distally from her when turning. Pt would benefit from 24/7 assistance from family initially upon d/c as she is at risk for falls. Will continue to follow acutely. Recommending follow-up with outpatient PT to address her deficits, maximize her independence and safety with all functional mobility, and decrease her risk for falls.    Follow Up Recommendations Outpatient PT;Supervision for mobility/OOB (recommend daughter stay with pt 24/7 initially upon d/c until pt is more safe and less at risk for falls)    Equipment Recommendations  Rolling walker with 5" wheels;3in1 (PT)    Recommendations  for Other Services       Precautions / Restrictions Precautions Precautions: Fall Precaution Comments: HOH Restrictions Weight Bearing Restrictions: No      Mobility  Bed Mobility Overal bed mobility: Needs Assistance Bed Mobility: Rolling;Sidelying to Sit Rolling: Mod assist Sidelying to sit: Mod assist;+2 for physical assistance       General bed mobility comments: Pt rolling to L with use of bed rail and cues to push through R foot on bed with minA. Pt rolling to L with modA due to poor initiation, pt stating "this is all I can do" and "I can't do it". ModAx2 to transition to sit with assistance at trunk and legs, cuing pt to push up through arms on bed.    Transfers Overall transfer level: Needs assistance Equipment used: Rolling walker (2 wheeled) Transfers: Sit to/from [employer] Sit to Stand: Min assist;+2 physical assistance;+2 safety/equipment Stand pivot transfers: Min assist       General transfer comment: Sit to stand from EOB > RW 1x with minAx2 to boost and steady pt, cuing pt to push up from bed with 1 hand and hold onto RW with other. Progressed to minAx1 to come to stand from recliner for safety. MinA to manage RW and cue pt for stand step to R bed > recliner.  Ambulation/Gait Ambulation/Gait assistance: Min guard Gait Distance (Feet): 30 Feet Assistive device: Rolling walker (2 wheeled) Gait Pattern/deviations: Step-through pattern;Decreased step length - left;Decreased step length - right;Decreased stride length;Shuffle;Trunk flexed Gait velocity: reduced Gait velocity interpretation: <1.31 ft/sec, indicative  of household ambulator General Gait Details: Pt appears anxious in regards to standing and walking, progressing from minA for several side steps to reclienr from bed to needing only min guard assist on 2nd standing bout to ambulate within the room. Pt displays a slow and mildly unsteady shuffling gait pattern, needing min guard for  safety. Pt tends to push RW distal when turning, needing cue to maintain proximity.  Stairs            Wheelchair Mobility    Modified Rankin (Stroke Patients Only)       Balance Overall balance assessment: Needs assistance Sitting-balance support: No upper extremity supported;Feet supported Sitting balance-Leahy Scale: Fair Sitting balance - Comments: Static sitting EOB with supervision.   Standing balance support: Bilateral upper extremity supported;During functional activity Standing balance-Leahy Scale: Poor Standing balance comment: Reliant on bil UEs on RW.                             Pertinent Vitals/Pain Pain Assessment: Faces Faces Pain Scale: Hurts a little bit Pain Location: L leg and neck Pain Descriptors / Indicators: Discomfort Pain Intervention(s): Limited activity within patient's tolerance;Monitored during session;Repositioned    Home Living Family/patient expects to be discharged to:: Private residence Living Arrangements: Alone Available Help at Discharge: Family;Available 24 hours/day Type of Home: House Home Access: Stairs to enter Entrance Stairs-Rails: Can reach both Entrance Stairs-Number of Steps: 3 Home Layout: One level Home Equipment: Cane - single point      Prior Function Level of Independence: Independent with assistive device(s)         Comments: Pt mod I with all mobility utilizing SPC. Pt independent with all bathing, dressing, cleaning, and cooking. Her children assist with home repairs.     Hand Dominance   Dominant Hand: Right    Extremity/Trunk Assessment   Upper Extremity Assessment Upper Extremity Assessment: Defer to OT evaluation    Lower Extremity Assessment Lower Extremity Assessment: Generalized weakness    Cervical / Trunk Assessment Cervical / Trunk Assessment: Kyphotic  Communication   Communication: HOH  Cognition Arousal/Alertness: Awake/alert Behavior During Therapy: WFL for tasks  assessed/performed Overall Cognitive Status: Within Functional Limits for tasks assessed                                 General Comments: A&Ox4.      General Comments      Exercises     Assessment/Plan    PT Assessment Patient needs continued PT services  PT Problem List Decreased strength;Decreased activity tolerance;Decreased balance;Decreased mobility;Decreased coordination;Decreased knowledge of use of DME;Decreased safety awareness;Pain;Decreased range of motion       PT Treatment Interventions DME instruction;Gait training;Stair training;Functional mobility training;Therapeutic activities;Therapeutic exercise;Balance training;Neuromuscular re-education;Patient/family education    PT Goals (Current goals can be found in the Care Plan section)  Acute Rehab PT Goals Patient Stated Goal: to return to her normal PT Goal Formulation: With patient Time For Goal Achievement: 06/27/20 Potential to Achieve Goals: Good    Frequency Min 3X/week   Barriers to discharge        Co-evaluation PT/OT/SLP Co-Evaluation/Treatment: Yes Reason for Co-Treatment: For patient/therapist safety;To address functional/ADL transfers           AM-PAC PT "6 Clicks" Mobility  Outcome Measure Help needed turning from your back to your side while in a flat bed without using bedrails?: A Lot  Help needed moving from lying on your back to sitting on the side of a flat bed without using bedrails?: A Lot Help needed moving to and from a bed to a chair (including a wheelchair)?: A Little Help needed standing up from a chair using your arms (e.g., wheelchair or bedside chair)?: A Little Help needed to walk in hospital room?: A Little Help needed climbing 3-5 steps with a railing? : A Little 6 Click Score: 16    End of Session Equipment Utilized During Treatment: Gait belt Activity Tolerance: Patient tolerated treatment well Patient left: in chair;with call bell/phone within  reach;with chair alarm set Nurse Communication: Mobility status PT Visit Diagnosis: Unsteadiness on feet (R26.81);Other abnormalities of gait and mobility (R26.89);Muscle weakness (generalized) (M62.81);History of falling (Z91.81);Difficulty in walking, not elsewhere classified (R26.2)    Time: 0962-8366 PT Time Calculation (min) (ACUTE ONLY): 41 min   Charges:   PT Evaluation $PT Eval Moderate Complexity: 1 Mod PT Treatments $Therapeutic Activity: 8-22 mins        Raymond Gurney, PT, DPT Acute Rehabilitation Services  Pager: [phone] Office: [phone]   Jewel Baize 06/13/2020, 10:25 AM

## 2020-06-18 ENCOUNTER — Emergency Department (HOSPITAL_BASED_OUTPATIENT_CLINIC_OR_DEPARTMENT_OTHER): Payer: Medicare Other

## 2020-06-18 ENCOUNTER — Other Ambulatory Visit: Payer: Self-pay

## 2020-06-18 ENCOUNTER — Encounter (HOSPITAL_BASED_OUTPATIENT_CLINIC_OR_DEPARTMENT_OTHER): Payer: Self-pay

## 2020-06-18 ENCOUNTER — Emergency Department (HOSPITAL_BASED_OUTPATIENT_CLINIC_OR_DEPARTMENT_OTHER)
Admission: EM | Admit: 2020-06-18 | Discharge: 2020-06-18 | Disposition: A | Discharge status: Home / Self Care | Payer: Medicare Other | Attending: Emergency Medicine | Admitting: Emergency Medicine

## 2020-06-18 DIAGNOSIS — N183 Chronic kidney disease, stage 3 unspecified: Secondary | ICD-10-CM | POA: Insufficient documentation

## 2020-06-18 DIAGNOSIS — Z79899 Other long term (current) drug therapy: Secondary | ICD-10-CM | POA: Insufficient documentation

## 2020-06-18 DIAGNOSIS — R0602 Shortness of breath: Secondary | ICD-10-CM | POA: Insufficient documentation

## 2020-06-18 DIAGNOSIS — R600 Localized edema: Secondary | ICD-10-CM | POA: Diagnosis not present

## 2020-06-18 DIAGNOSIS — I129 Hypertensive chronic kidney disease with stage 1 through stage 4 chronic kidney disease, or unspecified chronic kidney disease: Secondary | ICD-10-CM | POA: Diagnosis not present

## 2020-06-18 DIAGNOSIS — R531 Weakness: Secondary | ICD-10-CM | POA: Insufficient documentation

## 2020-06-18 HISTORY — DX: Pneumonia, unspecified organism: J18.9

## 2020-06-18 LAB — BASIC METABOLIC PANEL
Anion gap: 12 (ref 5–15)
BUN: 16 mg/dL (ref 8–23)
CO2: 25 mmol/L (ref 22–32)
Calcium: 10.5 mg/dL — ABNORMAL HIGH (ref 8.9–10.3)
Chloride: 100 mmol/L (ref 98–111)
Creatinine, Ser: 1.16 mg/dL — ABNORMAL HIGH (ref 0.44–1.00)
GFR, Estimated: 46 mL/min — ABNORMAL LOW (ref >60)
Glucose, Bld: 118 mg/dL — ABNORMAL HIGH (ref 70–99)
Potassium: 4 mmol/L (ref 3.5–5.1)
Sodium: 137 mmol/L (ref 135–145)

## 2020-06-18 LAB — URINALYSIS, ROUTINE W REFLEX MICROSCOPIC
Bilirubin Urine: NEGATIVE
Glucose, UA: NEGATIVE mg/dL
Ketones, ur: NEGATIVE mg/dL
Leukocytes,Ua: NEGATIVE
Nitrite: NEGATIVE
Protein, ur: NEGATIVE mg/dL
Specific Gravity, Urine: 1.02 (ref 1.005–1.030)
pH: 6 (ref 5.0–8.0)

## 2020-06-18 LAB — CBC WITH DIFFERENTIAL/PLATELET
Abs Immature Granulocytes: 0.07 10*3/uL (ref 0.00–0.07)
Basophils Absolute: 0.1 10*3/uL (ref 0.0–0.1)
Basophils Relative: 1 %
Eosinophils Absolute: 0.3 10*3/uL (ref 0.0–0.5)
Eosinophils Relative: 3 %
HCT: 42.7 % (ref 36.0–46.0)
Hemoglobin: 14.8 g/dL (ref 12.0–15.0)
Immature Granulocytes: 1 %
Lymphocytes Relative: 9 %
Lymphs Abs: 0.9 10*3/uL (ref 0.7–4.0)
MCH: 31.8 pg (ref 26.0–34.0)
MCHC: 34.7 g/dL (ref 30.0–36.0)
MCV: 91.8 fL (ref 80.0–100.0)
Monocytes Absolute: 0.8 10*3/uL (ref 0.1–1.0)
Monocytes Relative: 7 %
Neutro Abs: 8.3 10*3/uL — ABNORMAL HIGH (ref 1.7–7.7)
Neutrophils Relative %: 79 %
Platelets: 293 10*3/uL (ref 150–400)
RBC: 4.65 MIL/uL (ref 3.87–5.11)
RDW: 12.2 % (ref 11.5–15.5)
WBC: 10.4 10*3/uL (ref 4.0–10.5)
nRBC: 0 % (ref 0.0–0.2)

## 2020-06-18 LAB — URINALYSIS, MICROSCOPIC (REFLEX): Bacteria, UA: NONE SEEN

## 2020-06-18 MED ORDER — LACTATED RINGERS IV BOLUS
250.0000 mL | Freq: Once | INTRAVENOUS | Status: AC
  Administered 2020-06-18: 250 mL via INTRAVENOUS

## 2020-06-18 MED ORDER — IOHEXOL 350 MG/ML SOLN
100.0000 mL | Freq: Once | INTRAVENOUS | Status: AC | PRN
  Administered 2020-06-18: 80 mL via INTRAVENOUS

## 2020-06-18 NOTE — ED Provider Notes (Signed)
[facility] EMERGENCY DEPARTMENT Provider Note   CSN: [id number] Arrival date & time: 06/18/20  1217     History Chief Complaint  Patient presents with  . Leg Swelling    Gina [name] is a 87 y.o. female.  The history is provided by the patient and a relative (Daughter).        Gina [name] is a 87 y.o. female, with a history of CKD, GERD, HTN, presenting to the ED with continued generalized weakness beginning Friday, March 4. Patient had been receiving outpatient treatment for UTI and was at the end of this treatment.  Daughter states she witnessed patient seemed to become overall weaker and sustained a fall on March 4.  She was brought to the ED and admitted due to findings consistent with pneumonia on the chest x-ray.  She was discharged from the hospital March 6 with 5 days of cefdinir.  She was evaluated at urgent care on March 7 due to continued generalized weakness and shortness of breath. Daughter states patient's cough and shortness of breath are worse at night, but these improve when she sits the patient higher in bed. Daughter and the patient do admit that her symptoms have improved since her discharge from the hospital but they are concerned because she is not completely back to normal.  For example, they state patient lives by herself before her fall and hospitalization, ambulated with a cane, and did not have any difficulty getting around at home in terms of weakness and fatigue.  Now, however, she uses a walker to ambulate and gets fatigued more quickly.   They were also concerned due to lower extremity edema bilaterally. Patient was seen by her PCP today and was sent back to the ED for repeat evaluation.  They deny fever, focal weakness, headache, chest pain, current shortness of breath, abdominal pain, syncope, N/V/D, urinary symptoms, or any other complaints.    Past Medical History:  Diagnosis Date  . CKD (chronic kidney disease) stage 3, GFR 30-59  ml/min (HCC)   . GERD (gastroesophageal reflux disease)   . Hypertension   . Pneumonia     Patient Active Problem List   Diagnosis Date Noted  . Sepsis (HCC) 06/13/2020  . CAP (community acquired pneumonia) 06/13/2020  . Fever 06/12/2020  . SIRS (systemic inflammatory response syndrome) (HCC) 06/12/2020  . Fall at home, initial encounter 06/12/2020  . Hypotension 06/12/2020  . Hypokalemia 06/12/2020  . Hyponatremia 06/12/2020  . Weakness 06/12/2020    Past Surgical History:  Procedure Laterality Date  . FOOT SURGERY     Partial removal right great toe  . Right knee surgery    . SHOULDER SURGERY Right      OB History   No obstetric history on file.     Family History  Problem Relation Age of Onset  . Heart disease Mother   . Cataracts Mother   . Lung cancer Father     Social History   Tobacco Use  . Smoking status: Never Smoker  . Smokeless tobacco: Never Used  Substance Use Topics  . Alcohol use: Never  . Drug use: Never    Home Medications Prior to Admission medications   Medication Sig Start Date End Date Taking? Authorizing Provider  hydrochlorothiazide (HYDRODIURIL) 12.5 MG tablet Take 12.5 mg by mouth every other day. 04/05/20   [provider]  omeprazole (PRILOSEC) 20 MG capsule Take 20 mg by mouth daily. 04/05/20   [provider]  Polyethyl Glycol-Propyl Glycol (  SYSTANE OP) Place 1 drop into both eyes daily.    [provider]  valsartan (DIOVAN) 80 MG tablet Take 80 mg by mouth daily. 04/05/20   [provider]  White Petrolatum-Mineral Oil (SYSTANE NIGHTTIME OP) Place 1 drop into both eyes at bedtime.    [provider]    Allergies    Codeine  Review of Systems   Review of Systems  Constitutional: Negative for chills, diaphoresis and fever.  Respiratory: Positive for cough and shortness of breath.   Cardiovascular: Positive for leg swelling. Negative for chest pain.  Gastrointestinal: Negative  for abdominal pain, diarrhea, nausea and vomiting.  Genitourinary: Negative for difficulty urinating, dysuria and hematuria.  Musculoskeletal: Negative for back pain and neck pain.  Neurological: Positive for weakness.    Physical Exam Updated Vital Signs BP (!) 122/48   Pulse 74   Temp 98.3 F (36.8 C) (Oral)   Resp 20   SpO2 96%   Physical Exam Vitals and nursing note reviewed.  Constitutional:      General: She is not in acute distress.    Appearance: She is well-developed. She is not diaphoretic.  HENT:     Head: Normocephalic.     Comments: Patient sustained an abrasion when she fell on March 4.  This was again examined.  It seems to be clean and healing well.    Mouth/Throat:     Mouth: Mucous membranes are moist.     Pharynx: Oropharynx is clear.  Eyes:     Conjunctiva/sclera: Conjunctivae normal.  Cardiovascular:     Rate and Rhythm: Normal rate and regular rhythm.     Pulses: Normal pulses.          Radial pulses are 2+ on the right side and 2+ on the left side.       Dorsalis pedis pulses are 2+ on the right side and 2+ on the left side.     Heart sounds: Normal heart sounds.     Comments: Tactile temperature in the extremities appropriate and equal bilaterally. Pulmonary:     Effort: Pulmonary effort is normal. No respiratory distress.     Breath sounds: Normal breath sounds.     Comments: No increased work of breathing.  Speaks in full sentences without difficulty. Additionally, patient had no orthopnea.  She was able to lie supine without noted difficulty. Abdominal:     Palpations: Abdomen is soft.     Tenderness: There is no abdominal tenderness. There is no guarding.  Musculoskeletal:     Cervical back: Neck supple.     Right lower leg: Edema present.     Left lower leg: Edema present.     Comments: Lower extremities with edema but without increased warmth, erythema, or tenderness.  Lymphadenopathy:     Cervical: No cervical adenopathy.  Skin:     General: Skin is warm and dry.  Neurological:     Mental Status: She is alert.  Psychiatric:        Mood and Affect: Mood and affect normal.        Speech: Speech normal.        Behavior: Behavior normal.     ED Results / Procedures / Treatments   Labs (all labs ordered are listed, but only abnormal results are displayed) Labs Reviewed  CBC WITH DIFFERENTIAL/PLATELET - Abnormal; Notable for the following components:      Result Value   Neutro Abs 8.3 (*)    All other components within  normal limits  BASIC METABOLIC PANEL - Abnormal; Notable for the following components:   Glucose, Bld 118 (*)    Creatinine, Ser 1.16 (*)    Calcium 10.5 (*)    GFR, Estimated 46 (*)    All other components within normal limits  URINALYSIS, ROUTINE W REFLEX MICROSCOPIC - Abnormal; Notable for the following components:   Hgb urine dipstick TRACE (*)    All other components within normal limits  URINE CULTURE  URINALYSIS, MICROSCOPIC (REFLEX)    EKG EKG Interpretation  Date/Time:  Friday June 18 2020 12:29:25 EST Ventricular Rate:  76 PR Interval:  206 QRS Duration: 100 QT Interval:  386 QTC Calculation: 434 R Axis:     Text Interpretation: Sinus rhythm with Premature atrial complexes Possible Anterior infarct , age undetermined Abnormal ECG No significant change since last tracing Confirmed by Susy FrizzleSheldon, Charles [phone]([id number]) on 06/18/2020 3:00:53 PM   Radiology CT Angio Chest PE W and/or Wo Contrast  Result Date: 06/18/2020 CLINICAL DATA:  Chest pain or SOB, pleurisy or effusion suspected Recent hospitalization for pneumonia. Continue difficulty breathing. EXAM: CT ANGIOGRAPHY CHEST WITH CONTRAST TECHNIQUE: Multidetector CT imaging of the chest was performed using the standard protocol during bolus administration of intravenous contrast. Multiplanar CT image reconstructions and MIPs were obtained to evaluate the vascular anatomy. CONTRAST:  80mL OMNIPAQUE IOHEXOL 350 MG/ML SOLN COMPARISON:   Radiograph earlier today.  Radiograph 06/11/2020 FINDINGS: Cardiovascular: Breathing motion artifact limits basilar assessment. Allowing for this, there are no filling defects within the pulmonary arteries to suggest pulmonary embolus. Thoracic aorta is normal in caliber. No dissection or acute findings. Moderate atherosclerosis and tortuosity. Heart is normal in size. No pericardial effusion. Mediastinum/Nodes: No enlarged mediastinal or hilar lymph nodes. No visualized thyroid nodule. Mid esophagus is patulous. Small hiatal hernia. Lungs/Pleura: Small bilateral pleural effusions with adjacent compressive atelectasis. Minimal patchy opacity in the apical right upper lobe suspicious for pneumonia. Trace left apical ground-glass opacity. No septal thickening or findings of pulmonary edema. Trachea and central bronchi are patent. Upper Abdomen: Colonic interposition under the right hemidiaphragm, incidental. Left hepatic granuloma. There is a 2.4 cm left hypodense adrenal nodule. Musculoskeletal: Right shoulder hemiarthroplasty. Vertebral body hemangioma within T8. Mild thoracic spondylosis. There are no acute or suspicious osseous abnormalities. Review of the MIP images confirms the above findings. IMPRESSION: 1. No pulmonary embolus. 2. Small bilateral pleural effusions with adjacent compressive atelectasis. 3. Minimal patchy opacity in the apical right upper lobe suspicious for pneumonia. Trace left apical ground-glass opacity is likely infectious or inflammatory. 4. Low-density 2.4 cm left adrenal nodule, likely adenoma. No prior imaging available for comparison. Recommend nonemergent adrenal protocol CT for characterization on an elective basis. Aortic Atherosclerosis (ICD10-I70.0). Electronically Signed   By: Narda RutherfordMelanie  Sanford M.D.   On: 06/18/2020 17:49   DG Chest Portable 1 View  Result Date: 06/18/2020 CLINICAL DATA:  Shortness of breath.  Recent pneumonia. EXAM: PORTABLE CHEST 1 VIEW COMPARISON:  Chest  x-ray dated June 11, 2020. FINDINGS: The heart size and mediastinal contours are within normal limits. Atherosclerotic calcification of the aortic arch. Normal pulmonary vascularity. New small left pleural effusion with adjacent basilar atelectasis. The right lung is clear. No pneumothorax. No acute osseous abnormality. Prior right shoulder replacement. IMPRESSION: 1. New small left pleural effusion. Electronically Signed   By: Obie DredgeWilliam T Derry M.D.   On: 06/18/2020 13:14    Procedures Procedures   Medications Ordered in ED Medications  lactated ringers bolus 250 mL ( Intravenous Stopped 06/18/20 2122)  iohexol (OMNIPAQUE) 350 MG/ML injection 100 mL (80 mLs Intravenous Contrast Given 06/18/20 1649)    ED Course  I have reviewed the triage vital signs and the nursing notes.  Pertinent labs & imaging results that were available during my care of the patient were reviewed by me and considered in my medical decision making (see chart for details).  Clinical Course as of 06/19/20 1541  Fri Jun 18, 2020  1641 Spoke with Loann Quill, NP at patient's PCP office and the provider patient saw this morning. We discussed the results we had back thus far here in the ED. We also discussed her findings in the office and what prompted the recommendation for proceeding to the ED to assure we were sharing as much information as possible (especially since I was unable to see Ms. Smith's note in care everywhere at this time). She states it sounded as though the patient and her daughter were saying that the patient was no better at all from when she was discharged from the hospital with both shortness of breath and weakness. If the CT here in the ED does not reveal any actionable abnormalities, she will follow along with the patient's follow-up appointments and specialist referrals.  She will also inquire about the possibility for short-term rehab facility. [SJ]    Clinical Course User Index [SJ] Joy, Hillard Danker,  PA-C   MDM Rules/Calculators/A&P                          Patient presents with continued, but improving generalized weakness and nighttime shortness of breath. Patient is nontoxic appearing, afebrile, not tachycardic, not tachypneic, not hypotensive, maintains adequate SPO2 on room air, and is in no apparent distress.   I have reviewed the patient's chart to obtain more information.   I reviewed and interpreted the patient's labs and radiological studies. Slight increase in creatinine from previous values.  Very mild increase in calcium.  She will have this value retested through PCP. Chest CT without PE.  The other findings are consistent with a recent pneumonia and recently treated pneumonia.  Since her symptoms are overall improving I do not suspect a failure of outpatient therapy.  I suspect part of her issue was dehydration as she admits that she only consumes about 16 ounces of water a day.  To this point, patient states she feels much better following 250 cc IV fluids. I discussed at length with the patient and her daughter the possibility that the patient would need to spend some time recovering to get back to her previous baseline following two back-to-back illnesses and and admission to the hospital. As for the patient's lower extremity edema, she states she has been sitting up more and sitting more in general since she has been generally weak.  She has not really tried to elevate her legs.  Regardless, we will have the patient follow-up with cardiology for further evaluation on this matter.  Ambulated using a walker and without any other assistance.  Maintained SPO2 94 to 95% without onset of symptoms.   Findings and plan of care discussed with attending physician, Susy Frizzle, MD. Dr. Bernette Mayers personally evaluated and examined this patient.   Vitals:   06/18/20 2000 06/18/20 2100 06/18/20 2139 06/18/20 2139  BP: (!) 124/54 (!) 126/50  129/60  Pulse: 81 89 78 78  Resp: 20  18 18    Temp:   98.5 F (36.9 C) 98.5 F (36.9 C)  TempSrc:  Oral Oral  SpO2: 93% 95% 95% 96%  Weight:    76.7 kg     Final Clinical Impression(s) / ED Diagnoses Final diagnoses:  Bilateral lower extremity edema  Generalized weakness    Rx / DC Orders ED Discharge Orders    None       Concepcion Living 06/19/20 1547    Pollyann Savoy, MD 06/19/20 [phone]

## 2020-06-18 NOTE — ED Triage Notes (Addendum)
Per daughter pt admitted 3/5 for PNA-reports pt with bilat LE swelling x 2 days and states she thinks pt is no better-pt alert-NAD-denies pain-to triage in w/c-daughter added away from pt that pt has had "periods of confusion" x 1 week-states pt becomes upset when she mentions it

## 2020-06-18 NOTE — Discharge Instructions (Addendum)
Be sure to stay well-hydrated by drinking plenty of water. Follow-up with your primary care provider for any further assessment and management. Follow-up with cardiology due to lower extremity edema.  Call to make an appointment. Turn to the emergency department for chest pain, worsening shortness of breath, passing out, or any other major concerns.

## 2020-06-18 NOTE — ED Notes (Signed)
Phone given to family member in the room from PT family waiting in car.

## 2020-06-18 NOTE — ED Notes (Signed)
Discharge instructions  Reviewed. Vitals updated .  Pt is alert and oriented - dc through  [facility]

## 2020-06-18 NOTE — ED Notes (Addendum)
ED Provider at bedside. 

## 2020-06-18 NOTE — ED Notes (Signed)
Called to CT to assess IV patency, unable to flush 20g right AC.  IV removed, New start right forearm.

## 2020-06-18 NOTE — ED Notes (Signed)
Sat at 95 % while ambulating -  Denies CP and SOB . Pt on RA

## 2020-06-20 LAB — URINE CULTURE

## 2021-08-27 IMAGING — DX DG CHEST 1V PORT
1 series · 1 of 1 positions shown · non-contrast
Comparison: Chest x-ray dated June 11, 2020.

CLINICAL DATA: Shortness of breath.  Recent pneumonia.

EXAM:
PORTABLE CHEST 1 VIEW

[chest ap]
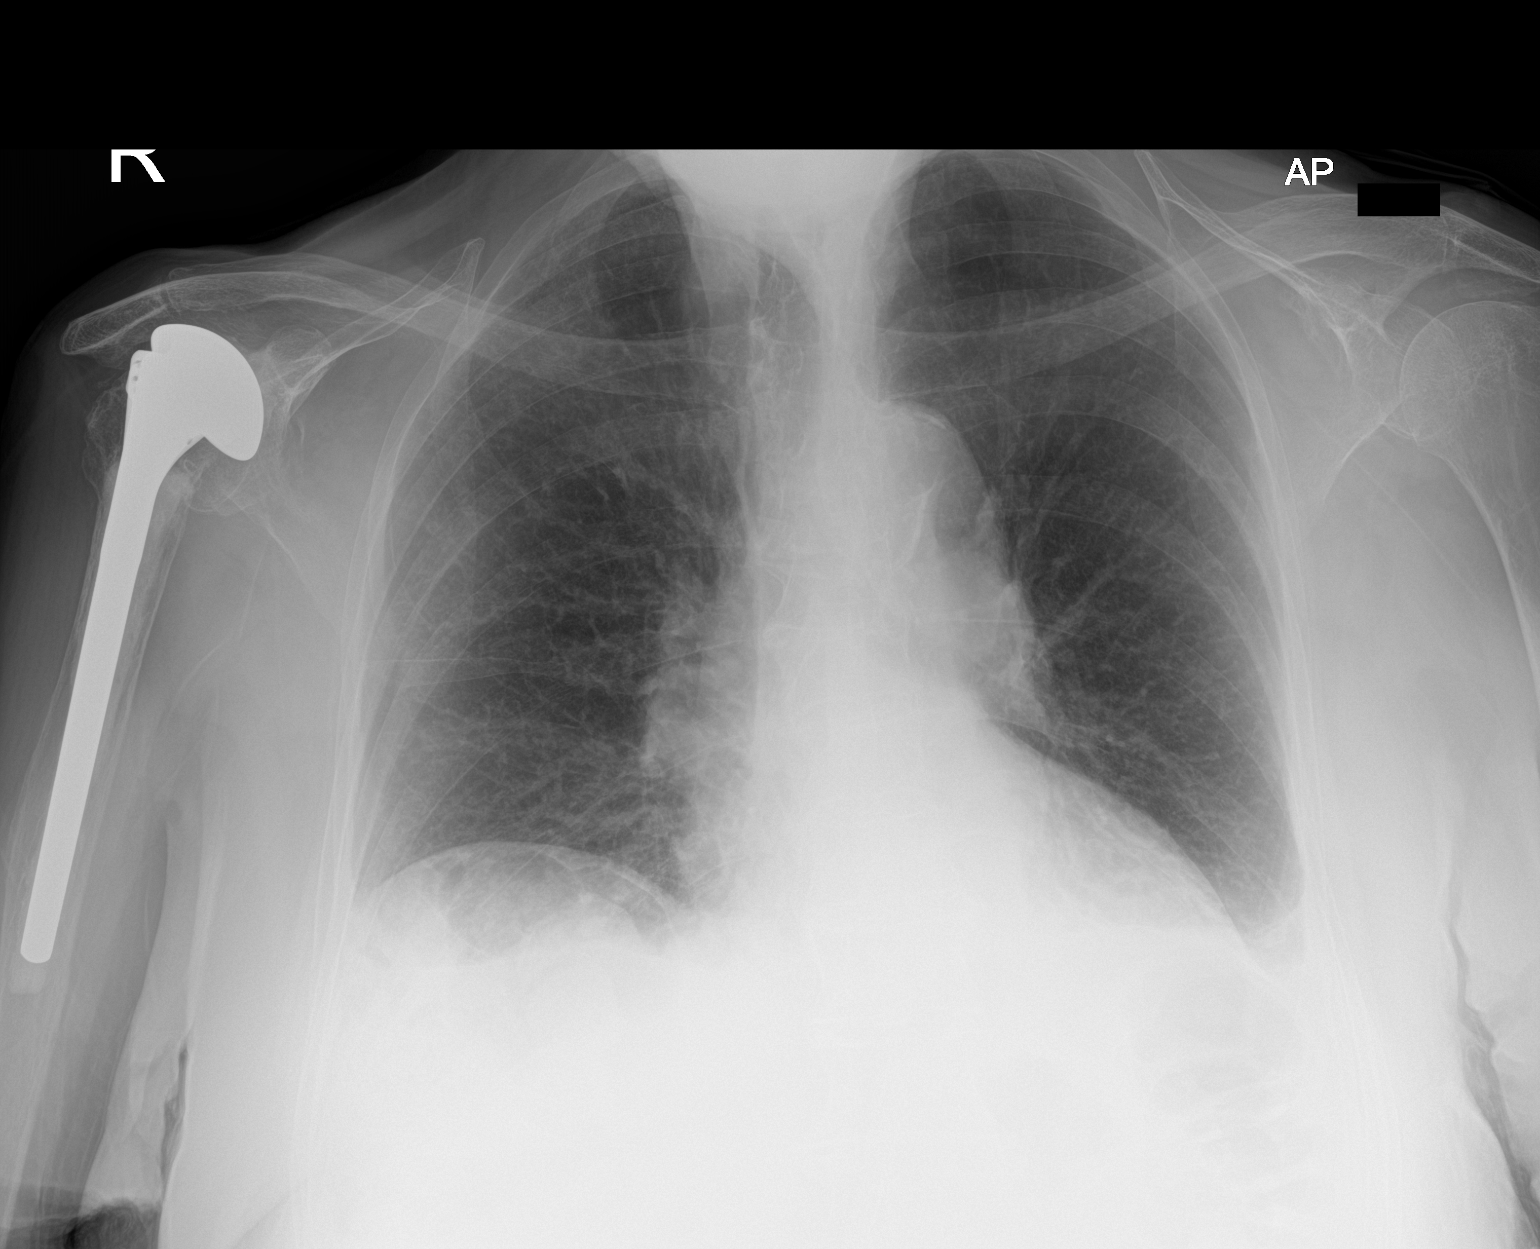

[1 of 1 positions shown; findings below may reference images not displayed]

FINDINGS: The heart size and mediastinal contours are within normal limits.
Atherosclerotic calcification of the aortic arch. Normal pulmonary
vascularity. New small left pleural effusion with adjacent basilar
atelectasis. The right lung is clear. No pneumothorax. No acute
osseous abnormality. Prior right shoulder replacement.
IMPRESSION: 1. New small left pleural effusion.

## 2021-11-20 ENCOUNTER — Inpatient Hospital Stay (HOSPITAL_COMMUNITY)
Admission: EM | Admit: 2021-11-20 | Discharge: 2021-11-24 | DRG: 689 | Disposition: A | Discharge status: Home Health | Payer: Medicare Other | Attending: Internal Medicine | Admitting: Internal Medicine

## 2021-11-20 ENCOUNTER — Encounter (HOSPITAL_COMMUNITY): Payer: Self-pay | Admitting: Emergency Medicine

## 2021-11-20 ENCOUNTER — Emergency Department (HOSPITAL_COMMUNITY): Payer: Medicare Other

## 2021-11-20 ENCOUNTER — Other Ambulatory Visit: Payer: Self-pay

## 2021-11-20 DIAGNOSIS — W19XXXA Unspecified fall, initial encounter: Principal | ICD-10-CM

## 2021-11-20 DIAGNOSIS — R531 Weakness: Secondary | ICD-10-CM

## 2021-11-20 DIAGNOSIS — I129 Hypertensive chronic kidney disease with stage 1 through stage 4 chronic kidney disease, or unspecified chronic kidney disease: Secondary | ICD-10-CM | POA: Diagnosis present

## 2021-11-20 DIAGNOSIS — G9341 Metabolic encephalopathy: Secondary | ICD-10-CM | POA: Diagnosis present

## 2021-11-20 DIAGNOSIS — N179 Acute kidney failure, unspecified: Secondary | ICD-10-CM | POA: Diagnosis present

## 2021-11-20 DIAGNOSIS — Y92009 Unspecified place in unspecified non-institutional (private) residence as the place of occurrence of the external cause: Secondary | ICD-10-CM

## 2021-11-20 DIAGNOSIS — H9190 Unspecified hearing loss, unspecified ear: Secondary | ICD-10-CM | POA: Diagnosis present

## 2021-11-20 DIAGNOSIS — N390 Urinary tract infection, site not specified: Principal | ICD-10-CM | POA: Diagnosis present

## 2021-11-20 DIAGNOSIS — M25562 Pain in left knee: Secondary | ICD-10-CM | POA: Diagnosis present

## 2021-11-20 DIAGNOSIS — G8929 Other chronic pain: Secondary | ICD-10-CM

## 2021-11-20 DIAGNOSIS — G510 Bell's palsy: Secondary | ICD-10-CM | POA: Diagnosis present

## 2021-11-20 DIAGNOSIS — Z885 Allergy status to narcotic agent status: Secondary | ICD-10-CM

## 2021-11-20 DIAGNOSIS — B9620 Unspecified Escherichia coli [E. coli] as the cause of diseases classified elsewhere: Secondary | ICD-10-CM | POA: Diagnosis present

## 2021-11-20 DIAGNOSIS — E871 Hypo-osmolality and hyponatremia: Secondary | ICD-10-CM | POA: Diagnosis present

## 2021-11-20 DIAGNOSIS — Z6827 Body mass index (BMI) 27.0-27.9, adult: Secondary | ICD-10-CM

## 2021-11-20 DIAGNOSIS — K219 Gastro-esophageal reflux disease without esophagitis: Secondary | ICD-10-CM | POA: Diagnosis present

## 2021-11-20 DIAGNOSIS — Z79899 Other long term (current) drug therapy: Secondary | ICD-10-CM

## 2021-11-20 DIAGNOSIS — J90 Pleural effusion, not elsewhere classified: Secondary | ICD-10-CM | POA: Diagnosis present

## 2021-11-20 DIAGNOSIS — N1831 Chronic kidney disease, stage 3a: Secondary | ICD-10-CM | POA: Diagnosis present

## 2021-11-20 DIAGNOSIS — E861 Hypovolemia: Secondary | ICD-10-CM | POA: Diagnosis present

## 2021-11-20 DIAGNOSIS — R627 Adult failure to thrive: Secondary | ICD-10-CM | POA: Diagnosis present

## 2021-11-20 DIAGNOSIS — I959 Hypotension, unspecified: Secondary | ICD-10-CM | POA: Diagnosis present

## 2021-11-20 DIAGNOSIS — Z8249 Family history of ischemic heart disease and other diseases of the circulatory system: Secondary | ICD-10-CM

## 2021-11-20 DIAGNOSIS — M1712 Unilateral primary osteoarthritis, left knee: Secondary | ICD-10-CM | POA: Diagnosis present

## 2021-11-20 DIAGNOSIS — E860 Dehydration: Secondary | ICD-10-CM | POA: Diagnosis present

## 2021-11-20 LAB — LACTIC ACID, PLASMA: Lactic Acid, Venous: 1.3 mmol/L (ref 0.5–1.9)

## 2021-11-20 LAB — COMPREHENSIVE METABOLIC PANEL
ALT: 12 U/L (ref 0–44)
AST: 22 U/L (ref 15–41)
Albumin: 3.5 g/dL (ref 3.5–5.0)
Alkaline Phosphatase: 68 U/L (ref 38–126)
Anion gap: 8 (ref 5–15)
BUN: 28 mg/dL — ABNORMAL HIGH (ref 8–23)
CO2: 21 mmol/L — ABNORMAL LOW (ref 22–32)
Calcium: 10 mg/dL (ref 8.9–10.3)
Chloride: 105 mmol/L (ref 98–111)
Creatinine, Ser: 1.41 mg/dL — ABNORMAL HIGH (ref 0.44–1.00)
GFR, Estimated: 36 mL/min — ABNORMAL LOW (ref >60)
Glucose, Bld: 103 mg/dL — ABNORMAL HIGH (ref 70–99)
Potassium: 3.6 mmol/L (ref 3.5–5.1)
Sodium: 134 mmol/L — ABNORMAL LOW (ref 135–145)
Total Bilirubin: 1 mg/dL (ref 0.3–1.2)
Total Protein: 6.9 g/dL (ref 6.5–8.1)

## 2021-11-20 LAB — CBC
HCT: 41.6 % (ref 36.0–46.0)
Hemoglobin: 14.5 g/dL (ref 12.0–15.0)
MCH: 32.7 pg (ref 26.0–34.0)
MCHC: 34.9 g/dL (ref 30.0–36.0)
MCV: 93.9 fL (ref 80.0–100.0)
Platelets: 158 10*3/uL (ref 150–400)
RBC: 4.43 MIL/uL (ref 3.87–5.11)
RDW: 12.2 % (ref 11.5–15.5)
WBC: 8 10*3/uL (ref 4.0–10.5)
nRBC: 0 % (ref 0.0–0.2)

## 2021-11-20 MED ORDER — CEPHALEXIN 500 MG PO CAPS: 500.0000 mg | ORAL_CAPSULE | Freq: Three times a day (TID) | ORAL | 0 refills | Status: DC

## 2021-11-20 MED ORDER — SODIUM CHLORIDE 0.9 % IV SOLN
1.0000 g | Freq: Once | INTRAVENOUS | Status: AC
  Administered 2021-11-21: 1 g via INTRAVENOUS
  Filled 2021-11-20: qty 10

## 2021-11-20 MED ORDER — SODIUM CHLORIDE 0.9 % IV BOLUS
1000.0000 mL | Freq: Once | INTRAVENOUS | Status: AC
  Administered 2021-11-20: 1000 mL via INTRAVENOUS

## 2021-11-20 NOTE — Discharge Instructions (Addendum)
It was our pleasure to provide your ER care today - we hope that you feel better.  Drink plenty of fluids/stay well hydrated. Take keflex for urine infection.   Follow up with primary care doctor this week if symptoms fail to improve/resolve.  Follow up with your orthopedist as planned regarding your knee pain.   Return to ER if worse, new symptoms, fevers, new/severe pain, chest pain, trouble breathing, weak/fainting, or other concern.

## 2021-11-20 NOTE — ED Provider Notes (Signed)
11:14 PM Assumed care from Dr. Denton Lank, please see their note for full history, physical and decision making until this point. In brief this is a 88 y.o. year old female who presented to the ED tonight with Fall     UTI with soft pressures. Getting fluids and abx. Reassess for disposition.   Labs with AKI, persistent weakness and difficulty standing/ambulating after a couple liters of fluids. Plan for obs with [facility].   Labs, studies and imaging reviewed by myself and considered in medical decision making if ordered. Imaging interpreted by radiology.  Labs Reviewed  CBC  LACTIC ACID, PLASMA  COMPREHENSIVE METABOLIC PANEL  URINALYSIS, ROUTINE W REFLEX MICROSCOPIC    DG Knee Complete 4 Views Left  Final Result    DG Chest 2 View  Final Result      No follow-ups on file.    Johnnell Liou, Barbara Cower, MD 11/21/21 [phone]

## 2021-11-20 NOTE — ED Triage Notes (Signed)
Per EMS, pt "slid" off the couch tonight b/c her left knee "gave out."  Pt reports she has had continued trouble w/ his left knee.  Daughter told EMS that pt "talked funny earlier today" but they all went out to dinner tonight w/ no issues.  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    She reports she started an antibiotic for an UTI yesterday.    94/58 Pulse 92 94% RA CBG 104

## 2021-11-20 NOTE — ED Provider Notes (Signed)
[facility] EMERGENCY DEPARTMENT Provider Note   CSN: [id number] Arrival date & time: 11/20/21  2046     History  Chief Complaint  Patient presents with   [name]    Gina [name] is a 88 y.o. female.  Pt presents with general weakness - family member indicates ~ 2 weeks ago with gen weakness and concern for uti - they dropped off sample and hadnt heard anything so recently called office two days ago, and was told culture positive for ecoli and had rx macrobid called in which pt started in past day. Pt denies dysuria. Family member notes had similar symptoms last year and was due to uti. Recent relatively poorer po intake, although pt indicates at burger and potato today. Pt denies abd pain or nausea/vomiting. No current or recent chest pain or discomfort. No cough or uri symptoms. This evening went to reach for something and slid from couch to floor. Indicates did not hit head. C/o pain to left knee, but family member indicates has c/o pain to left knee in past, and has f/u w ortho arranged.   The history is provided by the patient, medical records, a relative and the EMS personnel.  Fall Pertinent negatives include no chest pain, no abdominal pain, no headaches and no shortness of breath.       Home Medications Prior to Admission medications   Medication Sig Start Date End Date Taking? Authorizing Provider  hydrochlorothiazide (HYDRODIURIL) 12.5 MG tablet Take 12.5 mg by mouth every other day. 04/05/20   [provider]  omeprazole (PRILOSEC) 20 MG capsule Take 20 mg by mouth daily. 04/05/20   [provider]  Polyethyl Glycol-Propyl Glycol (SYSTANE OP) Place 1 drop into both eyes daily.    [provider]  valsartan (DIOVAN) 80 MG tablet Take 80 mg by mouth daily. 04/05/20   [provider]  White Petrolatum-Mineral Oil (SYSTANE NIGHTTIME OP) Place 1 drop into both eyes at bedtime.    [provider]      Allergies     Codeine    Review of Systems   Review of Systems  Constitutional:  Negative for chills and fever.  HENT:  Negative for sore throat.   Eyes:  Negative for redness.  Respiratory:  Negative for shortness of breath.   Cardiovascular:  Negative for chest pain.  Gastrointestinal:  Negative for abdominal pain, diarrhea and vomiting.  Genitourinary:  Negative for flank pain.  Musculoskeletal:  Negative for back pain and neck pain.  Skin:  Negative for rash.  Neurological:  Negative for headaches.  Hematological:  Does not bruise/bleed easily.  Psychiatric/Behavioral:  Negative for agitation.     Physical Exam Updated Vital Signs BP 94/62   Temp 99 F (37.2 C)   Resp 16   Ht 1.6 m (5\' 3" )   Wt 70.3 kg   SpO2 100%   BMI 27.46 kg/m  Physical Exam Vitals and nursing note reviewed.  Constitutional:      Appearance: Normal appearance. She is well-developed.  HENT:     Head: Atraumatic.     Nose: Nose normal.     Mouth/Throat:     Mouth: Mucous membranes are moist.     Pharynx: Oropharynx is clear.  Eyes:     General: No scleral icterus.    Conjunctiva/sclera: Conjunctivae normal.     Pupils: Pupils are equal, round, and reactive to light.  Neck:     Trachea: No tracheal deviation.     Comments:  No stiffness or rigidity.  Cardiovascular:     Rate and Rhythm: Normal rate and regular rhythm.     Pulses: Normal pulses.     Heart sounds: Normal heart sounds. No murmur heard.    No friction rub. No gallop.  Pulmonary:     Effort: Pulmonary effort is normal. No respiratory distress.     Breath sounds: Normal breath sounds.  Abdominal:     General: Bowel sounds are normal. There is no distension.     Palpations: Abdomen is soft.     Tenderness: There is no abdominal tenderness. There is no guarding.  Genitourinary:    Comments: No cva tenderness.  Musculoskeletal:        General: No swelling.     Cervical back: Normal range of motion and neck supple. No rigidity. No muscular  tenderness.     Comments: Tenderness left knee. Knee stable. No large effusion noted. No erythema or increased warmth. No other pain or focal bony tenderness on extremity exam. CTLS spine, non tender, aligned, no step off.   Skin:    General: Skin is warm and dry.     Findings: No rash.  Neurological:     Mental Status: She is alert.     Comments: Alert, speech normal. Motor/sens grossly intact bil.   Psychiatric:        Mood and Affect: Mood normal.     ED Results / Procedures / Treatments   Labs (all labs ordered are listed, but only abnormal results are displayed) Results for orders placed or performed during the hospital encounter of 06/18/20  Urine culture   Specimen: Urine, Random  Result Value Ref Range   Specimen Description      URINE, RANDOM Performed at [facility], [address] [place], [place] [address]    Special Requests      NONE Performed at [facility], [address] [place], [place] [address]    Culture MULTIPLE SPECIES PRESENT, SUGGEST RECOLLECTION (A)    Report Status 06/20/2020 FINAL   CBC with Differential  Result Value Ref Range   WBC 10.4 4.0 - 10.5 K/uL   RBC 4.65 3.87 - 5.11 MIL/uL   Hemoglobin 14.8 12.0 - 15.0 g/dL   HCT 86.5 78.4 - 69.6 %   MCV 91.8 80.0 - 100.0 fL   MCH 31.8 26.0 - 34.0 pg   MCHC 34.7 30.0 - 36.0 g/dL   RDW 29.5 28.4 - 13.2 %   Platelets 293 150 - 400 K/uL   nRBC 0.0 0.0 - 0.2 %   Neutrophils Relative % 79 %   Neutro Abs 8.3 (H) 1.7 - 7.7 K/uL   Lymphocytes Relative 9 %   Lymphs Abs 0.9 0.7 - 4.0 K/uL   Monocytes Relative 7 %   Monocytes Absolute 0.8 0.1 - 1.0 K/uL   Eosinophils Relative 3 %   Eosinophils Absolute 0.3 0.0 - 0.5 K/uL   Basophils Relative 1 %   Basophils Absolute 0.1 0.0 - 0.1 K/uL   Immature Granulocytes 1 %   Abs Immature Granulocytes 0.07 0.00 - 0.07 K/uL  Basic metabolic panel  Result Value Ref Range   Sodium 137 135 - 145 mmol/L   Potassium 4.0 3.5 - 5.1 mmol/L    Chloride 100 98 - 111 mmol/L   CO2 25 22 - 32 mmol/L   Glucose, Bld 118 (H) 70 - 99 mg/dL   BUN 16 8 - 23 mg/dL  Creatinine, Ser 1.16 (H) 0.44 - 1.00 mg/dL   Calcium 98.9 (H) 8.9 - 10.3 mg/dL   GFR, Estimated 46 (L) >60 mL/min   Anion gap 12 5 - 15  Urinalysis, Routine w reflex microscopic Urine, Clean Catch  Result Value Ref Range   Color, Urine YELLOW YELLOW   APPearance CLEAR CLEAR   Specific Gravity, Urine 1.020 1.005 - 1.030   pH 6.0 5.0 - 8.0   Glucose, UA NEGATIVE NEGATIVE mg/dL   Hgb urine dipstick TRACE (A) NEGATIVE   Bilirubin Urine NEGATIVE NEGATIVE   Ketones, ur NEGATIVE NEGATIVE mg/dL   Protein, ur NEGATIVE NEGATIVE mg/dL   Nitrite NEGATIVE NEGATIVE   Leukocytes,Ua NEGATIVE NEGATIVE  Urinalysis, Microscopic (reflex)  Result Value Ref Range   RBC / HPF 0-5 0 - 5 RBC/hpf   WBC, UA 0-5 0 - 5 WBC/hpf   Bacteria, UA NONE SEEN NONE SEEN   Squamous Epithelial / LPF 0-5 0 - 5     EKG None  Radiology DG Chest 2 View  Result Date: 11/20/2021 CLINICAL DATA:  Altered mental status EXAM: CHEST - 2 VIEW COMPARISON:  Radiographs 06/18/2020 FINDINGS: Left basilar atelectasis/scarring. Small left pleural effusion. No pneumothorax. Stable cardiomediastinal silhouette. Aortic atherosclerotic calcification. Elevated right hemidiaphragm. No acute osseous abnormality. IMPRESSION: Small left pleural effusion. Electronically Signed   By: Minerva Fester M.D.   On: 11/20/2021 22:05   DG Knee Complete 4 Views Left  Result Date: 11/20/2021 CLINICAL DATA:  Left knee gave out EXAM: LEFT KNEE - COMPLETE 4+ VIEW COMPARISON:  Radiographs 06/12/2020 FINDINGS: No significant change from prior. No fracture or dislocation. Severe tricompartmental degenerative arthritis with joint space narrowing and osteophyte formation. Trace knee joint effusion. Soft tissues are unremarkable. IMPRESSION: No acute fracture or dislocation. Advanced tricompartmental degenerative arthritis. Electronically Signed    By: Minerva Fester M.D.   On: 11/20/2021 22:03    Procedures Procedures    Medications Ordered in ED Medications  sodium chloride 0.9 % bolus 1,000 mL (has no administration in time range)    ED Course/ Medical Decision Making/ A&P                           Medical Decision Making Problems Addressed: Accidental fall, initial encounter: acute illness or injury Acute UTI: acute illness or injury with systemic symptoms that poses a threat to life or bodily functions Chronic pain of left knee: chronic illness or injury Generalized weakness: acute illness or injury with systemic symptoms that poses a threat to life or bodily functions  Amount and/or Complexity of Data Reviewed Independent Historian:     Details: family, ems, hx External Data Reviewed: labs and notes. Labs: ordered. Radiology: ordered.  Risk Prescription drug management. Decision regarding hospitalization.   Iv ns. Continuous pulse ox and cardiac monitoring. Labs ordered/sent. Imaging ordered.   Diff dx includes uti, dehydration, aki, knee injury, etc - dispo decision including potential need for admission considered - will get labs and imaging and reassess.   Reviewed nursing notes and prior charts for additional history. Recent pcp notes - pt w ecoli uti, sensitivity not available. Given recent positive cx, hx uti, will give dose rocephin iv. Ivf bolus. External reports reviewed. Additional history from: ems, family.   Cardiac monitor: sinus rhythm, rate 84.  Labs reviewed/interpreted by me - pnd.   Xrays reviewed/interpreted by me - no fx. ?sm eff.  2310, labs pending.  Signed out to Dr Erin Hearing - recent  uti, check labs, getting ivf.  If uti, would add rx keflex.  If AKI, or other acute abn - consider admission.              Final Clinical Impression(s) / ED Diagnoses Final diagnoses:  None    Rx / DC Orders ED Discharge Orders     None         Cathren Laine, MD 11/20/21 2316

## 2021-11-21 ENCOUNTER — Observation Stay (HOSPITAL_COMMUNITY): Payer: Medicare Other

## 2021-11-21 DIAGNOSIS — I129 Hypertensive chronic kidney disease with stage 1 through stage 4 chronic kidney disease, or unspecified chronic kidney disease: Secondary | ICD-10-CM | POA: Diagnosis present

## 2021-11-21 DIAGNOSIS — J90 Pleural effusion, not elsewhere classified: Secondary | ICD-10-CM | POA: Diagnosis present

## 2021-11-21 DIAGNOSIS — Y92009 Unspecified place in unspecified non-institutional (private) residence as the place of occurrence of the external cause: Secondary | ICD-10-CM

## 2021-11-21 DIAGNOSIS — W19XXXA Unspecified fall, initial encounter: Secondary | ICD-10-CM

## 2021-11-21 DIAGNOSIS — N179 Acute kidney failure, unspecified: Secondary | ICD-10-CM | POA: Diagnosis present

## 2021-11-21 DIAGNOSIS — G510 Bell's palsy: Secondary | ICD-10-CM | POA: Diagnosis present

## 2021-11-21 DIAGNOSIS — E871 Hypo-osmolality and hyponatremia: Secondary | ICD-10-CM

## 2021-11-21 DIAGNOSIS — N1831 Chronic kidney disease, stage 3a: Secondary | ICD-10-CM | POA: Diagnosis present

## 2021-11-21 DIAGNOSIS — Z8249 Family history of ischemic heart disease and other diseases of the circulatory system: Secondary | ICD-10-CM | POA: Diagnosis not present

## 2021-11-21 DIAGNOSIS — I959 Hypotension, unspecified: Secondary | ICD-10-CM

## 2021-11-21 DIAGNOSIS — G8929 Other chronic pain: Secondary | ICD-10-CM

## 2021-11-21 DIAGNOSIS — K219 Gastro-esophageal reflux disease without esophagitis: Secondary | ICD-10-CM | POA: Diagnosis present

## 2021-11-21 DIAGNOSIS — G9341 Metabolic encephalopathy: Secondary | ICD-10-CM | POA: Diagnosis present

## 2021-11-21 DIAGNOSIS — M1712 Unilateral primary osteoarthritis, left knee: Secondary | ICD-10-CM | POA: Diagnosis present

## 2021-11-21 DIAGNOSIS — M25562 Pain in left knee: Secondary | ICD-10-CM | POA: Diagnosis present

## 2021-11-21 DIAGNOSIS — E861 Hypovolemia: Secondary | ICD-10-CM | POA: Diagnosis present

## 2021-11-21 DIAGNOSIS — Z6827 Body mass index (BMI) 27.0-27.9, adult: Secondary | ICD-10-CM | POA: Diagnosis not present

## 2021-11-21 DIAGNOSIS — R531 Weakness: Secondary | ICD-10-CM

## 2021-11-21 DIAGNOSIS — Z885 Allergy status to narcotic agent status: Secondary | ICD-10-CM | POA: Diagnosis not present

## 2021-11-21 DIAGNOSIS — R627 Adult failure to thrive: Secondary | ICD-10-CM | POA: Diagnosis present

## 2021-11-21 DIAGNOSIS — H9190 Unspecified hearing loss, unspecified ear: Secondary | ICD-10-CM | POA: Diagnosis present

## 2021-11-21 DIAGNOSIS — B9620 Unspecified Escherichia coli [E. coli] as the cause of diseases classified elsewhere: Secondary | ICD-10-CM | POA: Diagnosis present

## 2021-11-21 DIAGNOSIS — E860 Dehydration: Secondary | ICD-10-CM | POA: Diagnosis present

## 2021-11-21 DIAGNOSIS — N189 Chronic kidney disease, unspecified: Secondary | ICD-10-CM

## 2021-11-21 DIAGNOSIS — Z79899 Other long term (current) drug therapy: Secondary | ICD-10-CM | POA: Diagnosis not present

## 2021-11-21 DIAGNOSIS — N390 Urinary tract infection, site not specified: Secondary | ICD-10-CM | POA: Diagnosis present

## 2021-11-21 LAB — PROCALCITONIN: Procalcitonin: 0.81 ng/mL

## 2021-11-21 LAB — D-DIMER, QUANTITATIVE: D-Dimer, Quant: 2.37 ug/mL-FEU — ABNORMAL HIGH (ref 0.00–0.50)

## 2021-11-21 LAB — URINALYSIS, ROUTINE W REFLEX MICROSCOPIC
Bacteria, UA: NONE SEEN
Bilirubin Urine: NEGATIVE
Glucose, UA: NEGATIVE mg/dL
Ketones, ur: 20 mg/dL — AB
Leukocytes,Ua: NEGATIVE
Nitrite: NEGATIVE
Protein, ur: NEGATIVE mg/dL
Specific Gravity, Urine: 1.013 (ref 1.005–1.030)
pH: 5 (ref 5.0–8.0)

## 2021-11-21 LAB — CK: Total CK: 242 U/L — ABNORMAL HIGH (ref 38–234)

## 2021-11-21 MED ORDER — DICLOFENAC SODIUM 1 % EX GEL
2.0000 g | Freq: Four times a day (QID) | CUTANEOUS | Status: DC
  Administered 2021-11-21 – 2021-11-22 (×3): 2 g via TOPICAL
  Filled 2021-11-21: qty 100

## 2021-11-21 MED ORDER — SODIUM CHLORIDE 0.9 % IV BOLUS
500.0000 mL | Freq: Once | INTRAVENOUS | Status: AC
  Administered 2021-11-21: 500 mL via INTRAVENOUS

## 2021-11-21 MED ORDER — SODIUM CHLORIDE 0.9 % IV SOLN: INTRAVENOUS | Status: DC

## 2021-11-21 MED ORDER — SODIUM CHLORIDE 0.9 % IV BOLUS
250.0000 mL | Freq: Once | INTRAVENOUS | Status: AC
  Administered 2021-11-21: 250 mL via INTRAVENOUS

## 2021-11-21 MED ORDER — ALBUTEROL SULFATE (2.5 MG/3ML) 0.083% IN NEBU
2.5000 mg | INHALATION_SOLUTION | Freq: Four times a day (QID) | RESPIRATORY_TRACT | Status: DC | PRN
Start: 2021-11-21 — End: 2021-11-24

## 2021-11-21 MED ORDER — ARTIFICIAL TEARS OPHTHALMIC OINT: 1.0000 | TOPICAL_OINTMENT | OPHTHALMIC | Status: DC | PRN

## 2021-11-21 MED ORDER — ACETAMINOPHEN 325 MG PO TABS
650.0000 mg | ORAL_TABLET | Freq: Four times a day (QID) | ORAL | Status: DC | PRN
  Administered 2021-11-21 – 2021-11-22 (×2): 650 mg via ORAL
  Filled 2021-11-21 (×2): qty 2

## 2021-11-21 MED ORDER — ENOXAPARIN SODIUM 30 MG/0.3ML IJ SOSY
30.0000 mg | PREFILLED_SYRINGE | INTRAMUSCULAR | Status: DC
  Administered 2021-11-21 – 2021-11-22 (×2): 30 mg via SUBCUTANEOUS
  Filled 2021-11-21 (×2): qty 0.3

## 2021-11-21 MED ORDER — IOHEXOL 350 MG/ML SOLN
83.0000 mL | Freq: Once | INTRAVENOUS | Status: AC | PRN
  Administered 2021-11-21: 83 mL via INTRAVENOUS

## 2021-11-21 MED ORDER — LACTATED RINGERS IV BOLUS
1000.0000 mL | Freq: Once | INTRAVENOUS | Status: AC
  Administered 2021-11-21: 1000 mL via INTRAVENOUS

## 2021-11-21 MED ORDER — ACETAMINOPHEN 650 MG RE SUPP: 650.0000 mg | Freq: Four times a day (QID) | RECTAL | Status: DC | PRN

## 2021-11-21 NOTE — ED Notes (Signed)
This NT took an oral temp and it was 97.5, but the patient would not keep her mouth closed. This NT then took an axillary temp and it was 101.1. Note the patient does have lots of blankets on. Rn notified, and is going to confirm.

## 2021-11-21 NOTE — ED Notes (Signed)
This NT assisted the patient with using a purwick in room 5, patient stated she did not have to go anymore after trying. This NT was instructed by the RN to do an I&O cath. This NT and Alexis EMT attempted twice and could not get any urine. This NT notified the RN while the patient was still in a room to I&O incase she wanted to attempt, RN instructed me to document.

## 2021-11-21 NOTE — ED Notes (Signed)
This NT attempted to ambulate the patient, patient refused. Patient and family said that she could not walk right now. Patient stated her hips hurt, and she maybe could walk tomorrow. Family expressed concern about discharge since she does not feel comfortable walking. Family stated they had many stairs at home that they would not be able to get her up.

## 2021-11-21 NOTE — ED Notes (Signed)
Gina [name], daughter [phone]

## 2021-11-21 NOTE — ED Notes (Signed)
Pt in modified trendelenburg to try and improve BP

## 2021-11-21 NOTE — H&P (Addendum)
History and Physical    Patient: Gina [name] YDX:[id number] DOB: 1933-02-23 DOA: 11/20/2021 DOS: the patient was seen and examined on 11/21/2021 PCP: Adolph Pollack, FNP  Patient coming from: Home lives alone via EMS  Chief Complaint:  Chief Complaint  Patient presents with   Fall   HPI: Gina [name] is a 88 y.o. female with medical history significant of hypertension, CKD stage III, Hard of hearing, Bell's palsy on the left, and GERD who presents after having a fall at home yesterday evening.  Patient reports that she fell and hurt her left hip and hand, but then stated I need to talk to her mother who is currently at home as she stayed up all night.  It reported that the patient at baseline has been living at home alone, ambulates with walking cane, drives short distances, and handles her own finances. Her daughter checks in on her ever other day and provides meals from time to time .  Patient told ems she slid of the sofa because her left knee gave out and complained of left knee pain.  Daughter provides additional history and states that it is the patient's bad knee for which she has appointment in September already set up with orthopedics.  Daughter notes that they just dropped her off from dinner couple hours before they received a call that she had fallen.  Early in the day the patient's daughter had noted that she was talking little finding, but she was fine during dinner.  She had just recently been diagnosed with a pansensitive E. coli urinary tract infection and had been started on Macrobid 2 days ago, but daughter reports that she probably only had given 2 doses of the medication.  She has been complaining of fatigue recently for which she had been seen by her primary care provider and they checked the urine culture.  Daughter notes patient had a similar hospitalization last year after having a fall where she was found to have concern for community-acquired pneumonia and hypotension  which was resolved with IV fluids and antibiotics.  Since being in the hospital her daughter reports that patient had developed a cough.  On admission into the emergency department patient was to be febrile up to 101.1 degrees axillary.  Report blood pressures 94/35 to 124/59, and all other vital signs maintained.  Labs significant for sodium 134, BUN 28, and creatinine 1.41.  X-rays of the left knee noted advanced tricompartmental degenerative arthritis without acute fracture..  Chest x-ray noted left-sided pleural effusion.  Urinalysis did not note any acute abnormality.  Patient had initially been given empiric antibiotics of Rocephin and 2 L of normal saline IV fluids.   Review of Systems: As mentioned in the history of present illness. All other systems reviewed and are negative. Past Medical History:  Diagnosis Date   CKD (chronic kidney disease) stage 3, GFR 30-59 ml/min (HCC)    GERD (gastroesophageal reflux disease)    Hypertension    Pneumonia    Past Surgical History:  Procedure Laterality Date   FOOT SURGERY     Partial removal right great toe   Right knee surgery     SHOULDER SURGERY Right    Social History:  reports that she has never smoked. She has never used smokeless tobacco. She reports that she does not drink alcohol and does not use drugs.  Allergies  Allergen Reactions   Codeine     Other reaction(s): Other (See Comments) Insomnia Insomnia  Family History  Problem Relation Age of Onset   Heart disease Mother    Cataracts Mother    Lung cancer Father     Prior to Admission medications   Medication Sig Start Date End Date Taking? Authorizing Provider  cephALEXin (KEFLEX) 500 MG capsule Take 1 capsule (500 mg total) by mouth 3 (three) times daily. 11/20/21  Yes Cathren Laine, MD  hydrochlorothiazide (HYDRODIURIL) 12.5 MG tablet Take 12.5 mg by mouth every other day. 04/05/20   [provider]  omeprazole (PRILOSEC) 20 MG capsule Take 20 mg by  mouth daily. 04/05/20   [provider]  Polyethyl Glycol-Propyl Glycol (SYSTANE OP) Place 1 drop into both eyes daily.    [provider]  valsartan (DIOVAN) 80 MG tablet Take 80 mg by mouth daily. 04/05/20   [provider]  White Petrolatum-Mineral Oil (SYSTANE NIGHTTIME OP) Place 1 drop into both eyes at bedtime.    [provider]    Physical Exam: Vitals:   11/21/21 0346 11/21/21 0428 11/21/21 0626 11/21/21 0845  BP: (!) 124/59  (!) 117/42   Pulse: 85  95   Resp: 18  18   Temp:  99 F (37.2 C)  98.3 F (36.8 C)  TempSrc:  Oral  Oral  SpO2: 99%  98%   Weight:      Height:       Exam  Constitutional: Elderly female who appears to be lethargic. Eyes: PERRL, lids and conjunctivae normal ENMT: Mucous membranes are moist. Posterior pharynx clear of any exudate or lesions.Normal dentition.  Neck: normal, supple, no masses, no thyromegaly Respiratory: clear to auscultation bilaterally, no wheezing, no crackles. Normal respiratory effort. No accessory muscle use.  Cardiovascular: Regular rate and rhythm, no murmurs / rubs / gallops. No extremity edema. 2+ pedal pulses. No carotid bruits.  Abdomen: no tenderness, no masses palpated. No hepatosplenomegaly. Bowel sounds positive.  Musculoskeletal: no clubbing / cyanosis. No joint deformity upper and lower extremities. Good ROM, no contractures. Normal muscle tone.  Skin: no rashes, lesions, ulcers. No induration Neurologic: CN 2-12 grossly intact.  Weakness noted in the bilateral lower extremities unable to move either leg off bed. Psychiatric: Normal judgment and insight. Alert and oriented x 3. Normal mood.   Data Reviewed:  EKG significant for sinus rhythm at 77 bpm with first-degree heart block. Assessment and Plan:  Fall at home left knee/hip pain From what can be obtained from the patient and her daughter she slid off the couch last night possible because her leg gave out.  She complained  of left hip and left knee pain.  X-rays were performed of the left knee noted advanced tricompartment degenerative disease.  Patient's daughter notes that she has a follow-up appointment with orthopedics scheduled for September. -Admit to a telemetry bed -Check x-ray of pelvis -Voltaren gel to affected joint -Courage patient to keep previous scheduled appointment with orthopedics for arthritis of the left knee.  Hypotension Acute.    Patient has been given 2 L of normal saline IV fluids while in the ED with temporary improvement of blood pressures.  Patient had been given at least 2.5 L of normal saline IV fluid.  Urinalysis did not show any signs of infection at this time.  Question cause of patient's hypotension. -Check blood cultures -Check D-dimer.  Will check CTA of the chest if noted to be elevated -Hold home blood pressure medications of hydrochlorothiazide and valsartan. -Bolus 250 mL of normal saline IV fluids -Goal MAP greater  than 65 -Adjust IV fluids as needed  Acute metabolic encephalopathy Patient patient was noted to be confused while in the emergency department, but daughter notes normally and oriented x4 able to complete all ADLs.  Patient's mother hada history of dementia.  TSH was noted to be 1.41 weeks ago on care everywhere record. -Neurochecks -Check CT scan of the head -May warrant further work-up if symptoms do not improve  Acute kidney injury superimposed on chronic kidney disease stage IIIa On admission creatinine 1.41 with BUN 28.  Creatinine previously had been 0.99 on 6 7/31 on record on Care Everywhere.  Urinalysis did not note any signs of infection.  Question possibility of dehydration as cause of patient's symptoms. -Add on CK given fall -Normal saline IV fluids at 75 mL/h -Recheck kidney function in a.m.  Hyponatremia Acute.  Sodium 134 on admission.  Possibly related to dehydration. -IV fluids as noted above -Recheck sodium levels in a.m.  UTI-not  present on admission  Left-sided pleural effusion Acute.  Patient noted to have small left-sided pleural effusion on chest x-ray.  Weakness Patient had been complaining of generalized fatigue over the last couple days for which she had been seen by her primary care provider. -PT/OT to eval and treat -Transitions of care consulted    Bell's palsy Patient has left-sided facial weakness related to prior history of Bell's palsy.  DVT prophylaxis: Lovenox Advance Care Planning:   Code Status: Prior  Consults: none  Family Communication: Daughter updated over the phone  Severity of Illness: The appropriate patient status for this patient is OBSERVATION. Observation status is judged to be reasonable and necessary in order to provide the required intensity of service to ensure the patient's safety. The patient's presenting symptoms, physical exam findings, and initial radiographic and laboratory data in the context of their medical condition is felt to place them at decreased risk for further clinical deterioration. Furthermore, it is anticipated that the patient will be medically stable for discharge from the hospital within 2 midnights of admission.   Author: Clydie Braun, MD 11/21/2021 9:20 AM  For on call review [email or web address].

## 2021-11-21 NOTE — ED Notes (Signed)
Pt happy w/ water and snacks.

## 2021-11-21 NOTE — ED Notes (Signed)
This NT changed the patient linen and gave new warm blankets after wetting herself.

## 2021-11-21 NOTE — ED Notes (Signed)
100cc output. Urine sent to lab.

## 2021-11-22 DIAGNOSIS — G9341 Metabolic encephalopathy: Secondary | ICD-10-CM | POA: Diagnosis not present

## 2021-11-22 DIAGNOSIS — M25562 Pain in left knee: Secondary | ICD-10-CM | POA: Diagnosis not present

## 2021-11-22 DIAGNOSIS — N179 Acute kidney failure, unspecified: Secondary | ICD-10-CM | POA: Diagnosis not present

## 2021-11-22 DIAGNOSIS — W19XXXA Unspecified fall, initial encounter: Secondary | ICD-10-CM | POA: Diagnosis not present

## 2021-11-22 LAB — CBC
HCT: 35.1 % — ABNORMAL LOW (ref 36.0–46.0)
Hemoglobin: 12.5 g/dL (ref 12.0–15.0)
MCH: 33 pg (ref 26.0–34.0)
MCHC: 35.6 g/dL (ref 30.0–36.0)
MCV: 92.6 fL (ref 80.0–100.0)
Platelets: 143 10*3/uL — ABNORMAL LOW (ref 150–400)
RBC: 3.79 MIL/uL — ABNORMAL LOW (ref 3.87–5.11)
RDW: 12.4 % (ref 11.5–15.5)
WBC: 4.9 10*3/uL (ref 4.0–10.5)
nRBC: 0 % (ref 0.0–0.2)

## 2021-11-22 LAB — BASIC METABOLIC PANEL
Anion gap: 5 (ref 5–15)
BUN: 19 mg/dL (ref 8–23)
CO2: 20 mmol/L — ABNORMAL LOW (ref 22–32)
Calcium: 8.9 mg/dL (ref 8.9–10.3)
Chloride: 111 mmol/L (ref 98–111)
Creatinine, Ser: 1.02 mg/dL — ABNORMAL HIGH (ref 0.44–1.00)
GFR, Estimated: 54 mL/min — ABNORMAL LOW (ref >60)
Glucose, Bld: 88 mg/dL (ref 70–99)
Potassium: 3.5 mmol/L (ref 3.5–5.1)
Sodium: 136 mmol/L (ref 135–145)

## 2021-11-22 MED ORDER — ENOXAPARIN SODIUM 40 MG/0.4ML IJ SOSY
40.0000 mg | PREFILLED_SYRINGE | INTRAMUSCULAR | Status: DC
  Administered 2021-11-23: 40 mg via SUBCUTANEOUS
  Filled 2021-11-22: qty 0.4

## 2021-11-22 MED ORDER — SODIUM CHLORIDE 0.9 % IV SOLN: INTRAVENOUS | Status: AC

## 2021-11-22 MED ORDER — SODIUM CHLORIDE 0.9 % IV SOLN
2.0000 g | Freq: Every day | INTRAVENOUS | Status: AC
  Administered 2021-11-22 – 2021-11-23 (×2): 2 g via INTRAVENOUS
  Filled 2021-11-22 (×4): qty 20

## 2021-11-22 MED ORDER — LIP MEDEX EX OINT
1.0000 | TOPICAL_OINTMENT | CUTANEOUS | Status: DC | PRN
  Administered 2021-11-22: 1 via TOPICAL
  Filled 2021-11-22: qty 7

## 2021-11-22 NOTE — Progress Notes (Signed)
[facility] PROGRESS NOTE    Progress Note  Gina [name]  MRN:[id number] DOB: 11/10/1932 DOA: 11/20/2021 PCP: Smith, Natalie Reid, FNP     Brief Narrative:   Gina [name] is an 88 y.o. female past medical history significant for essential hypertension, chronic kidney disease stage IIIa, hard of hearing, Bell's palsy comes in after having a fall at home the day prior to admission, he was recently diagnosed with a UTI pansensitive started on Macrobid has only taken 2 doses on admission in the ED was found  blood pressure of 94/35 creatinine of 1.4, x-ray of the knee showed degenerative changes no dislocation or fracture.  Chest x-ray showed left-sided pleural effusion.  Patient was started on IV fluids and started on IV Rocephin   Assessment/Plan:   Fall at home, initial encounter with left knee pain: X-ray of the knee showed advanced osteoarthritis no fractures. X-ray of her pelvis showed no acute findings. Started on Voltaren gel. She has a follow-up with orthopedics in September, consult physical therapy. PT/OT eval pending.  Transient hypotension: Fluid resuscitated in the ED blood pressure improved. She did had a fever, blood cultures were sent. CT angio of the chest showed bilateral pleural effusions no infiltrates or acute findings no PE. Antihypertensive medications were held on admission. Her blood pressure has remained stable. Was given 1 dose of IV Rocephin in the ED.  Acute metabolic encephalopathy: Did not appear to be confused in the ED CT of the head showed no acute findings.  Acute kidney injury superimposed on chronic kidney stage IIIa: With creatinine of less than 1, on admission 1.4. UA showed no signs of infection. Likely prerenal azotemia antihypertensive medications were held. Cont IV and recheck a B-met in am.  Hypovolemic hyponatremia: Resolved with fluid resuscitation.  Left-sided pleural effusion: She is currently  asymptomatic.  Generalized weakness: Daughter relates she has been weak for the last several weeks. Physical therapy and Occupational Therapy has been consulted might need skilled.  Bell's palsy: Noted.   DVT prophylaxis: lovenox Family Communication:daughter Status is: Inpatient Remains inpatient appropriate because: aki    Code Status:     Code Status Orders  (From admission, onward)           Start     Ordered   11/21/21 0930  Full code  Continuous        11/21/21 0929           Code Status History     Date Active Date Inactive Code Status Order ID Comments User Context   06/12/2020 0731 06/13/2020 2112 Full Code [id number]  Smith, Rondell A, MD ED         IV Access:   Peripheral IV   Procedures and diagnostic studies:   CT Angio Chest Pulmonary Embolism (PE) W or WO Contrast  Result Date: 11/21/2021 CLINICAL DATA:  Pulmonary embolism (PE) suspected, positive D-dimer EXAM: CT ANGIOGRAPHY CHEST WITH CONTRAST TECHNIQUE: Multidetector CT imaging of the chest was performed using the standard protocol during bolus administration of intravenous contrast. Multiplanar CT image reconstructions and MIPs were obtained to evaluate the vascular anatomy. RADIATION DOSE REDUCTION: This exam was performed according to the departmental dose-optimization program which includes automated exposure control, adjustment of the mA and/or kV according to patient size and/or use of iterative reconstruction technique. CONTRAST:  83mL OMNIPAQUE IOHEXOL 350 MG/ML SOLN COMPARISON:  CT 06/18/2020. FINDINGS: Cardiovascular: Satisfactory opacification of the pulmonary arteries to the segmental level. There is streak artifact, which somewhat obscures   the segmental and subsegmental branches of the right upper lobe. Within this limitation, there is no evidence of pulmonary embolism.Mild cardiomegaly. No pericardial disease. Mild atherosclerosis of the thoracic aorta. Mediastinum/Nodes: No  lymphadenopathy. The thyroid is unremarkable. Esophagus is unremarkable. Lungs/Pleura: Airways are patent. Trace bilateral pleural effusions with adjacent basilar atelectasis. No pneumothorax. No suspicious nodules Upper Abdomen: There is a low-density 2.1 cm left adrenal nodule measuring -0.8 Hounsfield units consistent with a benign adenoma, unchanged from prior exam in March 2022. Musculoskeletal: No acute osseous abnormality. No suspicious osseous lesion. Prior right shoulder arthroplasty. Unchanged T8 vertebral body hemangioma. Review of the MIP images confirms the above findings. IMPRESSION: No evidence of pulmonary embolism. Trace bilateral pleural effusions with adjacent basilar atelectasis. Stable left adrenal nodule consistent with a benign adenoma. Electronically Signed   By: Maurine Simmering M.D.   On: 11/21/2021 16:59   DG Pelvis 1-2 Views  Result Date: 11/21/2021 CLINICAL DATA:  Trauma, fall EXAM: PELVIS - 1-2 VIEW COMPARISON:  06/11/2020 FINDINGS: No displaced fracture is seen. Degenerative changes are noted in both hips. There is no dislocation. There is contrast in the collecting systems from previous CT. IMPRESSION: No displaced fracture or dislocation is seen in pelvis. Electronically Signed   By: Elmer Picker M.D.   On: 11/21/2021 16:50   CT HEAD WO CONTRAST (5MM)  Result Date: 11/21/2021 CLINICAL DATA:  Mental status change, unknown cause EXAM: CT HEAD WITHOUT CONTRAST TECHNIQUE: Contiguous axial images were obtained from the base of the skull through the vertex without intravenous contrast. RADIATION DOSE REDUCTION: This exam was performed according to the departmental dose-optimization program which includes automated exposure control, adjustment of the mA and/or kV according to patient size and/or use of iterative reconstruction technique. COMPARISON:  Head CT 06/11/2020 FINDINGS: Brain: No evidence of acute intracranial hemorrhage or extra-axial collection.Chronic lacunar infarct  in the right basal ganglia. Patent basal cisterns. No concerning mass effect.The ventricles are unchanged in size.Scattered subcortical and periventricular white matter hypodensities, nonspecific but likely sequela of chronic small vessel ischemic disease. Vascular: Vascular calcifications.  No hyperdense vessel. Skull: Negative for skull fracture. Sinuses/Orbits: Minimal paranasal sinus mucosal thickening. Orbits are unremarkable. Mastoid air cells are clear. Other: None. IMPRESSION: No acute intracranial abnormality. Unchanged chronic lacunar infarct in right basal ganglia Unchanged additional sequela of chronic small vessel ischemic disease. Electronically Signed   By: Maurine Simmering M.D.   On: 11/21/2021 16:46   DG Chest 2 View  Result Date: 11/20/2021 CLINICAL DATA:  Altered mental status EXAM: CHEST - 2 VIEW COMPARISON:  Radiographs 06/18/2020 FINDINGS: Left basilar atelectasis/scarring. Small left pleural effusion. No pneumothorax. Stable cardiomediastinal silhouette. Aortic atherosclerotic calcification. Elevated right hemidiaphragm. No acute osseous abnormality. IMPRESSION: Small left pleural effusion. Electronically Signed   By: Placido Sou M.D.   On: 11/20/2021 22:05   DG Knee Complete 4 Views Left  Result Date: 11/20/2021 CLINICAL DATA:  Left knee gave out EXAM: LEFT KNEE - COMPLETE 4+ VIEW COMPARISON:  Radiographs 06/12/2020 FINDINGS: No significant change from prior. No fracture or dislocation. Severe tricompartmental degenerative arthritis with joint space narrowing and osteophyte formation. Trace knee joint effusion. Soft tissues are unremarkable. IMPRESSION: No acute fracture or dislocation. Advanced tricompartmental degenerative arthritis. Electronically Signed   By: Placido Sou M.D.   On: 11/20/2021 22:03     Medical Consultants:   None.   Subjective:    Gina [name] feels better no complains  Objective:    Vitals:   11/21/21 1813 11/21/21 2013 11/22/21  0005  11/22/21 0434  BP: (!) 119/56 (!) 105/39 (!) 121/41 (!) 102/48  Pulse: 64 80 66 62  Resp: _0 Temp: 97.7 F (36.5 C) (!) 97.3 F (36.3 C) 98.1 F (36.7 C) 97.6 F (36.4 C)  TempSrc: Oral Oral Oral Oral  SpO2: 98% 95% 95% 96%  Weight:      Height:       SpO2: 96 %   Intake/Output Summary (Last 24 hours) at 11/22/2021 0728 Last data filed at 11/22/2021 0715 Gross per 24 hour  Intake 1649.08 ml  Output 425 ml  Net 1224.08 ml   Filed Weights   11/20/21 2057  Weight: 70.3 kg    Exam: General exam: In no acute distress. Respiratory system: Good air movement and clear to auscultation. Cardiovascular system: S1 & S2 heard, RRR. No JVD.  Gastrointestinal system: Abdomen is nondistended, soft and nontender.  Extremities: No pedal edema. Skin: No rashes, lesions or ulcers Psychiatry: Judgement and insight appear normal. Mood & affect appropriate.    Data Reviewed:    Labs: Basic Metabolic Panel: Recent Labs  Lab 11/20/21 2240 11/22/21 0329  NA 134* 136  K 3.6 3.5  CL 105 111  CO2 21* 20*  GLUCOSE 103* 88  BUN 28* 19  CREATININE 1.41* 1.02*  CALCIUM 10.0 8.9   GFR Estimated Creatinine Clearance: 37.3 mL/min (A) (by C-G formula based on SCr of 1.02 mg/dL (H)). Liver Function Tests: Recent Labs  Lab 11/20/21 2240  AST 22  ALT 12  ALKPHOS 68  BILITOT 1.0  PROT 6.9  ALBUMIN 3.5   No results for input(s): "LIPASE", "AMYLASE" in the last 168 hours. No results for input(s): "AMMONIA" in the last 168 hours. Coagulation profile No results for input(s): "INR", "PROTIME" in the last 168 hours. COVID-19 Labs  Recent Labs    11/21/21 1441  DDIMER 2.37*    Lab Results  Component Value Date   SARSCOV2NAA NEGATIVE 06/12/2020    CBC: Recent Labs  Lab 11/20/21 2240 11/22/21 0329  WBC 8.0 4.9  HGB 14.5 12.5  HCT 41.6 35.1*  MCV 93.9 92.6  PLT 158 143*   Cardiac Enzymes: Recent Labs  Lab 11/21/21 1441  CKTOTAL 242*   BNP (last 3  results) No results for input(s): "PROBNP" in the last 8760 hours. CBG: No results for input(s): "GLUCAP" in the last 168 hours. D-Dimer: Recent Labs    11/21/21 1441  DDIMER 2.37*   Hgb A1c: No results for input(s): "HGBA1C" in the last 72 hours. Lipid Profile: No results for input(s): "CHOL", "HDL", "LDLCALC", "TRIG", "CHOLHDL", "LDLDIRECT" in the last 72 hours. Thyroid function studies: No results for input(s): "TSH", "T4TOTAL", "T3FREE", "THYROIDAB" in the last 72 hours.  Invalid input(s): "FREET3" Anemia work up: No results for input(s): "VITAMINB12", "FOLATE", "FERRITIN", "TIBC", "IRON", "RETICCTPCT" in the last 72 hours. Sepsis Labs: Recent Labs  Lab 11/20/21 2240 11/21/21 1441 11/22/21 0329  PROCALCITON  --  0.81  --   WBC 8.0  --  4.9  LATICACIDVEN 1.3  --   --    Microbiology No results found for this or any previous visit (from the past 240 hour(s)).   Medications:    diclofenac Sodium  2 g Topical QID   enoxaparin (LOVENOX) injection  30 mg Subcutaneous Q24H   Continuous Infusions:  sodium chloride Stopped (11/21/21 2139)   sodium chloride 75 mL/hr at 11/21/21 2359      LOS: 1 day   Charlynne Cousins  [facility]  11/22/2021, 7:28 AM        

## 2021-11-22 NOTE — Evaluation (Signed)
Physical Therapy Evaluation Patient Details Name: Gina [name] MRN: [id number] DOB: 1932-09-02 Today's Date: 11/22/2021  History of Present Illness  Pt is an 88 yr old female who presented 11/20/21 due to sliding off the sofa due to her L knee giving out and c/o L hip pain. Pt recently found to have an UTI. chest x ray has shown L sided pleural effusion and x ray of L knee showed arthritis without fxs.  PMH: HTN, CKD stage 3, GERD, R Foot sx, shoulder sx, Bells palsy  Clinical Impression  Pt admitted with above diagnosis. Pt received in recliner, c/o L anterior and lateral knee pain from patella down to foot. Pt with noted bruising developing in this area as well as decreased quad strength L knee, 3+/5. Pt required min A for sit to stand from recliner for power up and to steady. Pt from home alone and does not sound like family can be with her consistently. Recommend short SNF stay for rehab before home.  Pt currently with functional limitations due to the deficits listed below (see PT Problem List). Pt will benefit from skilled PT to increase their independence and safety with mobility to allow discharge to the venue listed below.          Recommendations for follow up therapy are one component of a multi-disciplinary discharge planning process, led by the attending physician.  Recommendations may be updated based on patient status, additional functional criteria and insurance authorization.  Follow Up Recommendations Skilled nursing-short term rehab (<3 hours/day) Can patient physically be transported by private vehicle: Yes    Assistance Recommended at Discharge Frequent or constant Supervision/Assistance  Patient can return home with the following  A little help with bathing/dressing/bathroom;A lot of help with walking and/or transfers;Assistance with cooking/housework;Assist for transportation;Help with stairs or ramp for entrance    Equipment Recommendations None recommended by PT   Recommendations for Other Services       Functional Status Assessment Patient has had a recent decline in their functional status and demonstrates the ability to make significant improvements in function in a reasonable and predictable amount of time.     Precautions / Restrictions Precautions Precautions: Fall Precaution Comments: decrease in L knee stability Restrictions Weight Bearing Restrictions: No      Mobility  Bed Mobility Overal bed mobility: Needs Assistance Bed Mobility: Sit to Supine       Sit to supine: Supervision   General bed mobility comments: had difficulty getting LLE into bed, needed increased time but was eventually able to do this. Had difficulty scooting self up in bed    Transfers Overall transfer level: Needs assistance Equipment used: Rolling walker (2 wheels) Transfers: Sit to/from Stand Sit to Stand: Min assist           General transfer comment: needed min A from recliner for fwd wt shift and power up    Ambulation/Gait Ambulation/Gait assistance: Min assist Gait Distance (Feet): 10 Feet Assistive device: Rolling walker (2 wheels) Gait Pattern/deviations: Step-through pattern, Decreased weight shift to left Gait velocity: decreased Gait velocity interpretation: <1.31 ft/sec, indicative of household ambulator   General Gait Details: antalgic gait pattern due to L knee discomfort. Pt able to handle knee instability with use of hands on RW. Discussed the increased stability that the RW provides vs her preferred cane  Stairs            Wheelchair Mobility    Modified Rankin (Stroke Patients Only)  Balance Overall balance assessment: Needs assistance Sitting-balance support: Feet supported Sitting balance-Leahy Scale: Good     Standing balance support: Bilateral upper extremity supported, During functional activity, Reliant on assistive device for balance Standing balance-Leahy Scale: Fair Standing balance comment:  reliant on UE support                             Pertinent Vitals/Pain Pain Assessment Pain Assessment: Faces Faces Pain Scale: Hurts little more Pain Location: L knee Pain Descriptors / Indicators: Aching Pain Intervention(s): Limited activity within patient's tolerance, Monitored during session    Home Living Family/patient expects to be discharged to:: Private residence Living Arrangements: Alone Available Help at Discharge: Family Type of Home: House Home Access: Stairs to enter Entrance Stairs-Rails: Left Entrance Stairs-Number of Steps: 4   Home Layout: One level Home Equipment: Cane - single Barista (2 wheels) Additional Comments: pt reports her daughter checks on her    Prior Function Prior Level of Function : Driving;Independent/Modified Independent             Mobility Comments: Pt reports using SPC mostly ADLs Comments: Pt reports they complete a sponge bath at baseline     Hand Dominance   Dominant Hand: Right    Extremity/Trunk Assessment   Upper Extremity Assessment Upper Extremity Assessment: Defer to OT evaluation    Lower Extremity Assessment Lower Extremity Assessment: LLE deficits/detail;RLE deficits/detail;Generalized weakness RLE Deficits / Details: h/o R TKA, strength WFL RLE Sensation: WNL RLE Coordination: WNL LLE Deficits / Details: noted bruising anterior and lateral knee. Pt reports of anterior pain from patella to foot. knee ext 3+/5 due to pain LLE Sensation: WNL LLE Coordination: decreased gross motor    Cervical / Trunk Assessment Cervical / Trunk Assessment: Other exceptions;Kyphotic (due to past fall decrease in ability to maintian midline and uses a neck brace at baseline to correct position)  Communication   Communication: HOH  Cognition Arousal/Alertness: Awake/alert Behavior During Therapy: WFL for tasks assessed/performed Overall Cognitive Status: Within Functional Limits for tasks assessed                                           General Comments General comments (skin integrity, edema, etc.): VSS on RA. Does not sound like family can be with her consistently. Pt agreeable to rehab to get stronger    Exercises     Assessment/Plan    PT Assessment Patient needs continued PT services  PT Problem List Decreased strength;Decreased balance;Decreased mobility;Decreased activity tolerance;Decreased coordination;Decreased knowledge of use of DME;Decreased knowledge of precautions;Pain       PT Treatment Interventions DME instruction;Gait training;Stair training;Functional mobility training;Therapeutic activities;Therapeutic exercise;Balance training;Neuromuscular re-education;Cognitive remediation;Patient/family education    PT Goals (Current goals can be found in the Care Plan section)  Acute Rehab PT Goals Patient Stated Goal: return home PT Goal Formulation: With patient Time For Goal Achievement: 12/06/21 Potential to Achieve Goals: Good    Frequency Min 3X/week     Co-evaluation               AM-PAC PT "6 Clicks" Mobility  Outcome Measure Help needed turning from your back to your side while in a flat bed without using bedrails?: None Help needed moving from lying on your back to sitting on the side of a flat bed without using bedrails?: None Help  needed moving to and from a bed to a chair (including a wheelchair)?: A Little Help needed standing up from a chair using your arms (e.g., wheelchair or bedside chair)?: A Little Help needed to walk in hospital room?: A Little Help needed climbing 3-5 steps with a railing? : A Lot 6 Click Score: 19    End of Session Equipment Utilized During Treatment: Gait belt Activity Tolerance: Patient tolerated treatment well Patient left: in bed;with call bell/phone within reach;with family/visitor present;with bed alarm set Nurse Communication: Mobility status PT Visit Diagnosis: Unsteadiness on feet  (R26.81);Repeated falls (R29.6);Difficulty in walking, not elsewhere classified (R26.2);Pain Pain - Right/Left: Left Pain - part of body: Knee    Time: 1572-6203 PT Time Calculation (min) (ACUTE ONLY): 25 min   Charges:   PT Evaluation $PT Eval Moderate Complexity: 1 Mod PT Treatments $Gait Training: 8-22 mins        Lyanne Co, PT  Acute Rehab Services Secure chat preferred Office [phone]   Lawana Chambers Jader Desai 11/22/2021, 3:51 PM

## 2021-11-22 NOTE — Evaluation (Signed)
Occupational Therapy Evaluation Patient Details Name: Gina [name] MRN: [id number] DOB: 1933/02/10 Today's Date: 11/22/2021   History of Present Illness Pt is an 88 yr old female who presented 11/20/21 due to sliding off the sofa due to her L knee giving out and c/o L hip pain. Pt recently found to have an UTI, chest x ray has shown L sided pleural effusion and x ray of L knee showed arthritis without fxs.  PMH: HTN, CKD stage 3, GERD, R Foot sx, shoulder sx, Bells pals   Clinical Impression   Pt reported at baseline they were driving, ambulating with [facility] and daughter would visit daily with son living near here but works nights. Pt at this time was able to complete UE ADLS while sitting and set up to min guard with LB ADLS. Spoke to pt about having BSC in bathroom but declined and also was unsure about if a RW can fit in the bathroom area. Pt was able to complete room level mobility with RW and min guard with no LOB.  Pt currently with functional limitations due to the deficits listed below (see OT Problem List).  Pt will benefit from skilled OT to increase their safety and independence with ADL and functional mobility for ADL to facilitate discharge to venue listed below.        Recommendations for follow up therapy are one component of a multi-disciplinary discharge planning process, led by the attending physician.  Recommendations may be updated based on patient status, additional functional criteria and insurance authorization.   Follow Up Recommendations  Home health OT (if they can have family support with the return to home more then family visiting)    Assistance Recommended at Discharge Frequent or constant Supervision/Assistance  Patient can return home with the following A little help with walking and/or transfers;A little help with bathing/dressing/bathroom;Assistance with cooking/housework;Assist for transportation    Functional Status Assessment  Patient has had a recent decline  in their functional status and demonstrates the ability to make significant improvements in function in a reasonable and predictable amount of time.  Equipment Recommendations   (Pt was recomended to have 3 in 1 commode to assist with transfers in the bathroom but reported " I do not one of those")    Recommendations for Other Services       Precautions / Restrictions Precautions Precautions: Fall Precaution Comments: decrease in L knee stability Restrictions Weight Bearing Restrictions: No      Mobility Bed Mobility Overal bed mobility:  (Pt presented OOB)                  Transfers Overall transfer level: Needs assistance Equipment used: Rolling walker (2 wheels) Transfers: Sit to/from Stand Sit to Stand: Min guard                  Balance Overall balance assessment: Needs assistance Sitting-balance support: Feet supported Sitting balance-Leahy Scale: Good     Standing balance support: Bilateral upper extremity supported, During functional activity Standing balance-Leahy Scale: Fair                             ADL either performed or assessed with clinical judgement   ADL Overall ADL's : Needs assistance/impaired Eating/Feeding: Set up;Sitting   Grooming: Wash/dry hands;Wash/dry face;Set up;Sitting   Upper Body Bathing: Set up;Sitting   Lower Body Bathing: Min guard;Cueing for safety;Cueing for sequencing;Sit to/from stand   Upper Body Dressing :  Set up;Sitting   Lower Body Dressing: Min guard;Cueing for safety;Cueing for sequencing;Sit to/from stand   Toilet Transfer: Min guard;Cueing for safety;Cueing for sequencing;Rolling walker (2 wheels)   Toileting- Architect and Hygiene: Min guard;Cueing for safety;Cueing for compensatory techniques;Sit to/from Nurse, children's Details (indicate cue type and reason): Pt does not complete shower transfers at baseline Functional mobility during ADLs: Min guard;Rolling  walker (2 wheels)       Vision         Perception     Praxis      Pertinent Vitals/Pain Pain Assessment Pain Assessment: Faces Faces Pain Scale: Hurts a little bit Breathing: normal Negative Vocalization: none Facial Expression: smiling or inexpressive Body Language: relaxed Consolability: no need to console PAINAD Score: 0 Pain Location: L knee Pain Descriptors / Indicators: Nagging Pain Intervention(s): Limited activity within patient's tolerance, Monitored during session, Repositioned     Hand Dominance Right   Extremity/Trunk Assessment Upper Extremity Assessment Upper Extremity Assessment: RUE deficits/detail RUE Deficits / Details: chronic limited AROM due to a fall RUE Sensation: WNL RUE Coordination: WNL   Lower Extremity Assessment Lower Extremity Assessment: Defer to PT evaluation   Cervical / Trunk Assessment Cervical / Trunk Assessment: Other exceptions (due to past fall decrease in ability to maintian midline and uses a neck brace at baseline to correct position)   Communication Communication Communication: HOH   Cognition Arousal/Alertness: Awake/alert Behavior During Therapy: WFL for tasks assessed/performed Overall Cognitive Status: Within Functional Limits for tasks assessed                                       General Comments       Exercises     Shoulder Instructions      Home Living Family/patient expects to be discharged to:: Private residence Living Arrangements: Alone Available Help at Discharge: Family Type of Home: House Home Access: Stairs to enter Secretary/administrator of Steps: 4 Entrance Stairs-Rails: Left Home Layout: One level     Bathroom Shower/Tub: Chief Strategy Officer: Standard Bathroom Accessibility: No   Home Equipment: Cane - single Librarian, academic (2 wheels)          Prior Functioning/Environment Prior Level of Function : Driving             Mobility  Comments: Pt reports using SPC ADLs Comments: Pt reports they complete a sponge bath at baseline        OT Problem List: Decreased strength;Decreased range of motion;Decreased activity tolerance;Impaired balance (sitting and/or standing);Decreased safety awareness;Decreased knowledge of use of DME or AE;Cardiopulmonary status limiting activity;Pain      OT Treatment/Interventions: Self-care/ADL training;Therapeutic activities;Patient/family education;Balance training    OT Goals(Current goals can be found in the care plan section) Acute Rehab OT Goals Patient Stated Goal: to get back home OT Goal Formulation: With patient Time For Goal Achievement: 12/06/21 Potential to Achieve Goals: Good ADL Goals Pt Will Perform Lower Body Bathing: with modified independence Pt Will Transfer to Toilet: with modified independence;ambulating Additional ADL Goal #1: Pt will tolerate 10 mins of activity with no LOB to increase in tolerance for IADLS at home  OT Frequency: Min 2X/week    Co-evaluation              AM-PAC OT "6 Clicks" Daily Activity     Outcome Measure Help from another person eating meals?: None  Help from another person taking care of personal grooming?: None Help from another person toileting, which includes using toliet, bedpan, or urinal?: A Little Help from another person bathing (including washing, rinsing, drying)?: A Little Help from another person to put on and taking off regular upper body clothing?: None Help from another person to put on and taking off regular lower body clothing?: A Little 6 Click Score: 21   End of Session Equipment Utilized During Treatment: Rolling walker (2 wheels);Gait belt Nurse Communication: Mobility status  Activity Tolerance: Patient tolerated treatment well Patient left: in chair;with call bell/phone within reach;with chair alarm set  OT Visit Diagnosis: Other abnormalities of gait and mobility (R26.89);Unsteadiness on feet  (R26.81);Repeated falls (R29.6);Muscle weakness (generalized) (M62.81);Pain Pain - Right/Left: Left Pain - part of body: Knee                Time: 1914-7829 OT Time Calculation (min): 29 min Charges:  OT General Charges $OT Visit: 1 Visit OT Evaluation $OT Eval Low Complexity: 1 Low OT Treatments $Self Care/Home Management : 8-22 mins  Alphia Moh OTR/L  Acute Rehab Services  [phone] office number [phone] pager number   Alphia Moh 11/22/2021, 11:54 AM

## 2021-11-22 NOTE — Progress Notes (Signed)
11/22/21 1645  Clinical Encounter Type  Visited With Patient;Health care provider  Visit Type Initial  Referral From Nurse Cicero Duck)  Consult/Referral To Chaplain Melvenia Beam)  Recommendations Advance Directive Education   Chaplain responded to spiritual care consultation requesting assistance for Advance Directive preparation with Gina [name]. Chaplain met with Gina [name] at patient's bedside.   Gina [name] stated that she  is NOT Married.  Gina [name] indicated that she intends to list her Daughter, Gina [name] [phone], as her [place].   Chaplain provided the Advance Directive packet as well as education on Advance Directives-documents an individual completes to communicate their health care directions in advance of a time when they may need them. Chaplain informed Gina [name] the documents which may be completed here in the hospital are the Living Will and [place].   Chaplain informed Gina [name] that the [place] is a legal document in which an individual names another person, as their [place], to communicate her health care decisions, when she is not able to communicate them for herself. The Health Care Agent's function can be temporary or permanent depending on her ability to make and communicate those decisions independently.   Chaplain informed Gina [name] in the absence of a [place], the state of [place] directs health care providers to look to the following individuals in the order listed: legal guardian; an attorney-in-fact under a general power of attorney (POA) if that POA includes the right to make health care decisions; person's spouse; a 23 of her adult children; a 52 of adult brothers and sisters; or an individual who has an established relationship with you, who is acting in good faith and who can convey your  wishes.  If none of these persons are available or willing to make medical decisions on a patient's behalf, the law allows the patient's doctor to make decisions for them as long as another doctor agrees with those decisions.  Chaplain also informed the patient that the Health Care agent has no decision-making authority over any affairs other than those related to her medical care.   The chaplain further educated Gina [name] that a Living Will is a legal document that allows her desires not to receive life-prolonging measures in the event that she has a condition that is incurable and will result in her death in a short period of time; they are unconscious, and doctors are confident that she will not regain consciousness; and/or she has advanced dementia or other substantial and irreversible loss of mental function.   The chaplain informed Gina [name] that life-prolonging measures are medical treatments that would only serve to postpone death, including breathing machines, kidney dialysis, antibiotics, artificial nutrition and hydration (tube feeding), and similar forms of treatment and that if an individual is able to express their wishes, they may also make them known without the use of a Living Will, but in the event that she is not able to express her wishes, a Living Will allows medical providers and the her family and friends to ensure that they are not making decisions on her behalf, but rather serving as her voice to convey decisions that she has already made.   Gina [name] is aware that the decision to create an advance directive is hers alone and she may choose not to complete the documents or may choose to complete one portion or  both.  Gina [name] was informed that she can revoke the documents at any time by striking through them and writing void or by completing new documents, but that it is also advisable that the individual verbally notify interested parties that her wishes have changed.   Ms.  [name] is also aware that the document must be signed in the presence of a notary public and two witnesses and that this may be done while the patient is still admitted to the hospital or after discharge in the community. If they decide to complete Advance Directives after being discharged from the hospital, they have been advised to notify all interested parties and to provide those documents to their physicians and loved ones in addition to bringing them to the hospital in the event of another hospitalization.   The chaplain informed the Gina [name] that if she desires to proceed with completing the Advance Directive Documentation while she is still admitted, notary services are typically available at [facility] between the hours of 1:00 and 3:30 Monday-Thursday.   When the patient is ready to have these documents completed, the patient should request that their nurse place a spiritual care consult and indicate that the patient is ready to have their advance directives notarized so that arrangements for witnesses and notary public can be made.  If there is an immediate need to have notarized please page the Chaplain.   Please page spiritual care if the patient desires further education or has questions.   [address] [place], [place] [phone].

## 2021-11-23 DIAGNOSIS — W19XXXA Unspecified fall, initial encounter: Secondary | ICD-10-CM | POA: Diagnosis not present

## 2021-11-23 DIAGNOSIS — Y92009 Unspecified place in unspecified non-institutional (private) residence as the place of occurrence of the external cause: Secondary | ICD-10-CM | POA: Diagnosis not present

## 2021-11-23 DIAGNOSIS — I959 Hypotension, unspecified: Secondary | ICD-10-CM | POA: Diagnosis not present

## 2021-11-23 MED ORDER — FAMOTIDINE 20 MG PO TABS
10.0000 mg | ORAL_TABLET | Freq: Every day | ORAL | Status: DC
  Administered 2021-11-23 – 2021-11-24 (×2): 10 mg via ORAL
  Filled 2021-11-23 (×3): qty 1

## 2021-11-23 NOTE — Progress Notes (Signed)
Mobility Specialist Progress Note    11/23/21 1451  Mobility  Activity Ambulated with assistance in hallway  Level of Assistance Minimal assist, patient does 75% or more  Assistive Device Front wheel walker  Distance Ambulated (ft) 90 ft  Activity Response Tolerated well  $Mobility charge 1 Mobility   Pt received in chair and agreeable. No complaints on walk. Returned to chair with call bell in reach.    Victoria Vera Nation Mobility Specialist

## 2021-11-23 NOTE — Progress Notes (Signed)
[facility] PROGRESS NOTE    Progress Note  Gina [name]  [id number] DOB: 08/14/1932 DOA: 11/20/2021 PCP: Lorenda Hatchet, FNP     Brief Narrative:   Gina [name] is an 88 y.o. female past medical history significant for essential hypertension, chronic kidney disease stage IIIa, hard of hearing, Bell's palsy comes in after having a fall at home the day prior to admission, he was recently diagnosed with a UTI pansensitive started on Macrobid has only taken 2 doses on admission in the ED was found  blood pressure of 94/35 creatinine of 1.4, x-ray of the knee showed degenerative changes no dislocation or fracture.  Chest x-ray showed left-sided pleural effusion.  Patient was started on IV fluids and started on IV Rocephin   Assessment/Plan:     UTI/encephalopathy/transient hypotension [name] -CT head no acute findings -confusion resolved, blood pressure stabilized  -Improved with IV Rocephin x3, blood culture no growth ,urine culture no growth, though she reported urinary symptoms, she was started on antibiotic prior to coming to the hospital  Acute kidney injury superimposed on chronic kidney stage IIIa: Likely from UTI and dehydration  Creatinine improved  Home medication HCTZ and Diovan held  Hypovolemic hyponatremia: Resolved with fluid resuscitation.   . CT angio of the chest showed mild bilateral pleural effusions /atelectasis ,no infiltrates or acute findings no PE. She is on room air, in no respiratory symptoms May need lasix prn at discharge   FTT Family reports she slide down from sitting position due to weakness, denies hard fall at home X-ray of the knee showed advanced osteoarthritis no fractures. X-ray of her pelvis showed no acute findings. Started on Voltaren gel. She has a follow-up with orthopedics in September, consult physical therapy. PT/OT eval recommends SNF placement patient and family in agreement  Bell's palsy: Noted.   DVT  prophylaxis: lovenox Family Communication:daughter at bedside Awaiting for  SNF placement    Code Status:     Code Status Orders  (From admission, onward)           Start     Ordered   11/21/21 0930  Full code  Continuous        11/21/21 0929           Code Status History     Date Active Date Inactive Code Status Order ID Comments User Context   06/12/2020 0731 06/13/2020 2112 Full Code [id number]  Norval Morton, MD ED         IV Access:   Peripheral IV   Procedures and diagnostic studies:   CT Angio Chest Pulmonary Embolism (PE) W or WO Contrast  Result Date: 11/21/2021 CLINICAL DATA:  Pulmonary embolism (PE) suspected, positive D-dimer EXAM: CT ANGIOGRAPHY CHEST WITH CONTRAST TECHNIQUE: Multidetector CT imaging of the chest was performed using the standard protocol during bolus administration of intravenous contrast. Multiplanar CT image reconstructions and MIPs were obtained to evaluate the vascular anatomy. RADIATION DOSE REDUCTION: This exam was performed according to the departmental dose-optimization program which includes automated exposure control, adjustment of the mA and/or kV according to patient size and/or use of iterative reconstruction technique. CONTRAST:  53mL OMNIPAQUE IOHEXOL 350 MG/ML SOLN COMPARISON:  CT 06/18/2020. FINDINGS: Cardiovascular: Satisfactory opacification of the pulmonary arteries to the segmental level. There is streak artifact, which somewhat obscures the segmental and subsegmental branches of the right upper lobe. Within this limitation, there is no evidence of pulmonary embolism.Mild cardiomegaly. No pericardial disease. Mild atherosclerosis of the thoracic aorta.  Mediastinum/Nodes: No lymphadenopathy. The thyroid is unremarkable. Esophagus is unremarkable. Lungs/Pleura: Airways are patent. Trace bilateral pleural effusions with adjacent basilar atelectasis. No pneumothorax. No suspicious nodules Upper Abdomen: There is a low-density  2.1 cm left adrenal nodule measuring -0.8 Hounsfield units consistent with a benign adenoma, unchanged from prior exam in March 2022. Musculoskeletal: No acute osseous abnormality. No suspicious osseous lesion. Prior right shoulder arthroplasty. Unchanged T8 vertebral body hemangioma. Review of the MIP images confirms the above findings. IMPRESSION: No evidence of pulmonary embolism. Trace bilateral pleural effusions with adjacent basilar atelectasis. Stable left adrenal nodule consistent with a benign adenoma. Electronically Signed   By: Maurine Simmering M.D.   On: 11/21/2021 16:59   DG Pelvis 1-2 Views  Result Date: 11/21/2021 CLINICAL DATA:  Trauma, fall EXAM: PELVIS - 1-2 VIEW COMPARISON:  06/11/2020 FINDINGS: No displaced fracture is seen. Degenerative changes are noted in both hips. There is no dislocation. There is contrast in the collecting systems from previous CT. IMPRESSION: No displaced fracture or dislocation is seen in pelvis. Electronically Signed   By: Elmer Picker M.D.   On: 11/21/2021 16:50   CT HEAD WO CONTRAST (5MM)  Result Date: 11/21/2021 CLINICAL DATA:  Mental status change, unknown cause EXAM: CT HEAD WITHOUT CONTRAST TECHNIQUE: Contiguous axial images were obtained from the base of the skull through the vertex without intravenous contrast. RADIATION DOSE REDUCTION: This exam was performed according to the departmental dose-optimization program which includes automated exposure control, adjustment of the mA and/or kV according to patient size and/or use of iterative reconstruction technique. COMPARISON:  Head CT 06/11/2020 FINDINGS: Brain: No evidence of acute intracranial hemorrhage or extra-axial collection.Chronic lacunar infarct in the right basal ganglia. Patent basal cisterns. No concerning mass effect.The ventricles are unchanged in size.Scattered subcortical and periventricular white matter hypodensities, nonspecific but likely sequela of chronic small vessel ischemic  disease. Vascular: Vascular calcifications.  No hyperdense vessel. Skull: Negative for skull fracture. Sinuses/Orbits: Minimal paranasal sinus mucosal thickening. Orbits are unremarkable. Mastoid air cells are clear. Other: None. IMPRESSION: No acute intracranial abnormality. Unchanged chronic lacunar infarct in right basal ganglia Unchanged additional sequela of chronic small vessel ischemic disease. Electronically Signed   By: Maurine Simmering M.D.   On: 11/21/2021 16:46     Medical Consultants:   None.   Subjective:    Marshae Shrawder feels better no complains  Objective:    Vitals:   11/22/21 1124 11/22/21 1739 11/22/21 1947 11/23/21 0431  BP:  (!) 133/54 (!) 120/44 (!) 112/51  Pulse:  76 77 70  Resp: 20 17 20 20   Temp:  98 F (36.7 C) 98.1 F (36.7 C) 97.9 F (36.6 C)  TempSrc:  Oral Oral Oral  SpO2:  95% 96% 95%  Weight:      Height:       SpO2: 95 %   Intake/Output Summary (Last 24 hours) at 11/23/2021 1312 Last data filed at 11/23/2021 0951 Gross per 24 hour  Intake 2023.61 ml  Output 1500 ml  Net 523.61 ml   Filed Weights   11/20/21 2057  Weight: 70.3 kg    Exam: General exam: Frail, in no acute distress. Aaox3, slightly hard of hearing Respiratory system: Good air movement and clear to auscultation. Cardiovascular system: S1 & S2 heard, RRR. No JVD.  Gastrointestinal system: Abdomen is nondistended, soft and nontender.  Extremities: No pedal edema. Skin: No rashes, lesions or ulcers Psychiatry: Judgement and insight appear normal. Mood & affect appropriate.    Data  Reviewed:    Labs: Basic Metabolic Panel: Recent Labs  Lab 11/20/21 2240 11/22/21 0329  NA 134* 136  K 3.6 3.5  CL 105 111  CO2 21* 20*  GLUCOSE 103* 88  BUN 28* 19  CREATININE 1.41* 1.02*  CALCIUM 10.0 8.9   GFR Estimated Creatinine Clearance: 37.3 mL/min (A) (by C-G formula based on SCr of 1.02 mg/dL (H)). Liver Function Tests: Recent Labs  Lab 11/20/21 2240  AST 22  ALT  12  ALKPHOS 68  BILITOT 1.0  PROT 6.9  ALBUMIN 3.5   No results for input(s): "LIPASE", "AMYLASE" in the last 168 hours. No results for input(s): "AMMONIA" in the last 168 hours. Coagulation profile No results for input(s): "INR", "PROTIME" in the last 168 hours. COVID-19 Labs  Recent Labs    11/21/21 1441  DDIMER 2.37*    Lab Results  Component Value Date   SARSCOV2NAA NEGATIVE 06/12/2020    CBC: Recent Labs  Lab 11/20/21 2240 11/22/21 0329  WBC 8.0 4.9  HGB 14.5 12.5  HCT 41.6 35.1*  MCV 93.9 92.6  PLT 158 143*   Cardiac Enzymes: Recent Labs  Lab 11/21/21 1441  CKTOTAL 242*   BNP (last 3 results) No results for input(s): "PROBNP" in the last 8760 hours. CBG: No results for input(s): "GLUCAP" in the last 168 hours. D-Dimer: Recent Labs    11/21/21 1441  DDIMER 2.37*   Hgb A1c: No results for input(s): "HGBA1C" in the last 72 hours. Lipid Profile: No results for input(s): "CHOL", "HDL", "LDLCALC", "TRIG", "CHOLHDL", "LDLDIRECT" in the last 72 hours. Thyroid function studies: No results for input(s): "TSH", "T4TOTAL", "T3FREE", "THYROIDAB" in the last 72 hours.  Invalid input(s): "FREET3" Anemia work up: No results for input(s): "VITAMINB12", "FOLATE", "FERRITIN", "TIBC", "IRON", "RETICCTPCT" in the last 72 hours. Sepsis Labs: Recent Labs  Lab 11/20/21 2240 11/21/21 1441 11/22/21 0329  PROCALCITON  --  0.81  --   WBC 8.0  --  4.9  LATICACIDVEN 1.3  --   --    Microbiology Recent Results (from the past 240 hour(s))  Urine Culture     Status: Abnormal (Preliminary result)   Collection Time: 11/20/21  3:28 AM   Specimen: In/Out Cath Urine  Result Value Ref Range Status   Specimen Description IN/OUT CATH URINE  Final   Special Requests ADDED 0328 11/21/2021  Final   Culture (A)  Final    1,000 COLONIES/mL DIPHTHEROIDS(CORYNEBACTERIUM SPECIES) Standardized susceptibility testing for this organism is not available. Performed at [facility], [address] [place], [place] [address]    Report Status PENDING  Incomplete  Culture, blood (Routine X 2) w Reflex to ID Panel     Status: None (Preliminary result)   Collection Time: 11/21/21  1:23 PM   Specimen: BLOOD  Result Value Ref Range Status   Specimen Description BLOOD BLOOD LEFT FOREARM  Final   Special Requests   Final    BOTTLES DRAWN AEROBIC AND ANAEROBIC Blood Culture results may not be optimal due to an inadequate volume of blood received in culture bottles   Culture   Final    NO GROWTH 2 DAYS Performed at [facility], [address], [place], [place] [address]    Report Status PENDING  Incomplete  Culture, blood (Routine X 2) w Reflex to ID Panel     Status: None (Preliminary result)   Collection Time: 11/21/21  2:46 PM   Specimen: BLOOD  Result Value Ref Range  Status   Specimen Description BLOOD BLOOD RIGHT FOREARM  Final   Special Requests   Final    BOTTLES DRAWN AEROBIC AND ANAEROBIC Blood Culture results may not be optimal due to an inadequate volume of blood received in culture bottles   Culture   Final    NO GROWTH 2 DAYS Performed at [facility], [address] [place], [place] [address]    Report Status PENDING  Incomplete     Medications:    diclofenac Sodium  2 g Topical QID   enoxaparin (LOVENOX) injection  40 mg Subcutaneous Q24H   famotidine  10 mg Oral Daily   Continuous Infusions:  sodium chloride 75 mL/hr at 11/22/21 2235      LOS: 2 days   Albertine Grates MD PhD FACP [facility]  11/23/2021, 1:12 PM

## 2021-11-23 NOTE — TOC Initial Note (Signed)
Transition of Care [facility]) - Initial/Assessment Note    Patient Details  Name: Gina [name] MRN: [id number] Date of Birth: 1932-10-05  Transition of [facility]) CM/SW Contact:    Delilah Shan, LCSWA Phone Number: 11/23/2021, 1:31 PM  Clinical Narrative:                  CSW received consult for possible SNF placement at time of discharge. CSW spoke with patient at bedside regarding PT recommendation of SNF placement at time of discharge. Patient reports she comes from home alone. Patient expressed understanding of PT recommendation and is agreeable to SNF placement at time of discharge. Patient reports preference for [facility] . Patient gave CSW permission to fax out initial referral near the [place] area.CSW discussed insurance authorization process and will provide Medicare SNF ratings list with accepted SNF bed offers when available. Patient reports she has not received the COVID vaccines. No further questions reported at this time. CSW to continue to follow and assist with discharge planning needs.   Expected Discharge Plan: Skilled Nursing Facility Barriers to Discharge: Continued Medical Work up   Patient Goals and CMS Choice Patient states their goals for this hospitalization and ongoing recovery are:: SNF CMS Medicare.gov Compare Post Acute Care list provided to:: Patient Choice offered to / list presented to : Patient  Expected Discharge Plan and Services Expected Discharge Plan: Skilled Nursing Facility In-house Referral: Clinical Social Work     Living arrangements for the past 2 months: Single Family Home                                      Prior Living Arrangements/Services Living arrangements for the past 2 months: Single Family Home Lives with:: Self Patient language and need for interpreter reviewed:: Yes Do you feel safe going back to the place where you live?: No   SNF  Need for Family Participation in Patient Care: Yes (Comment) Care giver  support system in place?: Yes (comment)   Criminal Activity/Legal Involvement Pertinent to Current Situation/Hospitalization: No - Comment as needed  Activities of Daily Living      Permission Sought/Granted Permission sought to share information with : Case Manager, Family Supports, Magazine features editor Permission granted to share information with : Yes, Verbal Permission Granted  Share Information with NAME: Marylu Lund  Permission granted to share info w AGENCY: SNF  Permission granted to share info w Relationship: daughter  Permission granted to share info w Contact Information: Marylu Lund [phone]  Emotional Assessment Appearance:: Appears stated age Attitude/Demeanor/Rapport: Gracious Affect (typically observed): Calm Orientation: : Oriented to Self, Oriented to Place, Oriented to  Time, Oriented to Situation Alcohol / Substance Use: Not Applicable Psych Involvement: No (comment)  Admission diagnosis:  Acute UTI [N39.0] Generalized weakness [R53.1] AKI (acute kidney injury) (HCC) [N17.9] Chronic pain of left knee [M25.562, G89.29] Hypotension [I95.9] Accidental fall, initial encounter [W09.XXXA] Acute renal failure superimposed on stage 3a chronic kidney disease (HCC) [N17.9, N18.31] Patient Active Problem List   Diagnosis Date Noted   Acute kidney injury superimposed on chronic kidney disease (HCC) 11/21/2021   Left knee pain 11/21/2021   Acute metabolic encephalopathy 11/21/2021   Pleural effusion on left 11/21/2021   Sepsis (HCC) 06/13/2020   CAP (community acquired pneumonia) 06/13/2020   Fever 06/12/2020   SIRS (systemic inflammatory response syndrome) (HCC) 06/12/2020   Fall at home, initial encounter 06/12/2020   Hypotension 06/12/2020  Hypokalemia 06/12/2020   Hyponatremia 06/12/2020   Weakness 06/12/2020   PCP:  Adolph Pollack, FNP Pharmacy:   [facility] [place], [place] - [address] [place]  [address] Phone: [phone] Fax: [phone]  [id number] [place], [place] - [address] AT [facility] OF [address] [place] [address] Phone: [phone] Fax: [phone]     Social Determinants of Health (SDOH) Interventions    Readmission Risk Interventions     No data to display

## 2021-11-23 NOTE — Progress Notes (Signed)
Occupational Therapy Treatment Patient Details Name: Gina [name] MRN: [id number] DOB: 22-Mar-1933 Today's Date: 11/23/2021   History of present illness Pt is an 88 yr old female who presented 11/20/21 due to sliding off the sofa due to her L knee giving out and c/o L hip pain. Pt recently found to have an UTI. chest x ray has shown L sided pleural effusion and x ray of L knee showed arthritis without fxs.  PMH: HTN, CKD stage 3, GERD, R Foot sx, shoulder sx, Bells palsy   OT comments  Patient engaged in grooming tasks OOB in recliner with HR noted to range from 88 to 100 bpm. Patient was min A to transfer from edge fo bed to recliner with education on proper hand placement. Patient would continue to benefit from skilled OT services at this time while admitted and after d/c to address noted deficits in order to improve overall safety and independence in ADLs.     Recommendations for follow up therapy are one component of a multi-disciplinary discharge planning process, led by the attending physician.  Recommendations may be updated based on patient status, additional functional criteria and insurance authorization.    Follow Up Recommendations  Skilled nursing-short term rehab (<3 hours/day)    Assistance Recommended at Discharge Frequent or constant Supervision/Assistance  Patient can return home with the following  A little help with walking and/or transfers;A little help with bathing/dressing/bathroom;Assistance with cooking/housework;Assist for transportation   Equipment Recommendations       Recommendations for Other Services      Precautions / Restrictions Precautions Precautions: Fall Precaution Comments: decrease in L knee stability Restrictions Weight Bearing Restrictions: No       Mobility Bed Mobility Overal bed mobility: Needs Assistance Bed Mobility: Supine to Sit     Supine to sit: Min assist     General bed mobility comments: with increased time to advance  BLE to EOB secondary to pain levels in LLE    Transfers                         Balance                                           ADL either performed or assessed with clinical judgement   ADL Overall ADL's : Needs assistance/impaired     Grooming: Wash/dry hands;Wash/dry face;Set up;Sitting;Oral care                     Toilet Transfer Details (indicate cue type and reason): patient was minA with RW to transfer from edge of bed to recliner in room with increased time and education to reach hands back to recliner prior to attempting to sit down.           General ADL Comments: patient was educated on importance of gettng out of bed each day. patient verbalized understanding.    Extremity/Trunk Assessment              Vision       Perception     Praxis      Cognition Arousal/Alertness: Awake/alert Behavior During Therapy: WFL for tasks assessed/performed Overall Cognitive Status: Within Functional Limits for tasks assessed  Exercises      Shoulder Instructions       General Comments      Pertinent Vitals/ Pain       Pain Assessment Pain Assessment: Faces Faces Pain Scale: Hurts even more Pain Location: L knee Pain Descriptors / Indicators: Aching, Discomfort, Grimacing Pain Intervention(s): Limited activity within patient's tolerance, Monitored during session  Home Living                                          Prior Functioning/Environment              Frequency  Min 2X/week        Progress Toward Goals  OT Goals(current goals can now be found in the care plan section)  Progress towards OT goals: Progressing toward goals     Plan Discharge plan needs to be updated    Co-evaluation                 AM-PAC OT "6 Clicks" Daily Activity     Outcome Measure   Help from another person eating meals?: None Help  from another person taking care of personal grooming?: None Help from another person toileting, which includes using toliet, bedpan, or urinal?: A Little Help from another person bathing (including washing, rinsing, drying)?: A Little Help from another person to put on and taking off regular upper body clothing?: A Little Help from another person to put on and taking off regular lower body clothing?: A Lot 6 Click Score: 19    End of Session Equipment Utilized During Treatment: Rolling walker (2 wheels);Gait belt  OT Visit Diagnosis: Other abnormalities of gait and mobility (R26.89);Unsteadiness on feet (R26.81);Repeated falls (R29.6);Muscle weakness (generalized) (M62.81);Pain Pain - Right/Left: Left Pain - part of body: Knee   Activity Tolerance Patient tolerated treatment well   Patient Left in chair;with call bell/phone within reach;with chair alarm set   Nurse Communication Mobility status        Time: 9326-7124 OT Time Calculation (min): 24 min  Charges: OT Treatments $Self Care/Home Management : 23-37 mins  Sharyn Blitz OTR/L, MS Acute Rehabilitation Department Office# [phone] Pager# [phone]   Ardyth Harps 11/23/2021, 9:03 AM

## 2021-11-23 NOTE — [place] FL2 (Signed)
[place] MEDICAID FL2 LEVEL OF CARE SCREENING TOOL     IDENTIFICATION  Patient Name: Gina [name] Birthdate: Apr 27, 1932 Sex: female Admission Date (Current Location): 11/20/2021  [facility] and [place] Number:  Producer, television/film/video and Address:  [facility] [place], [address], [place], [place] [address]      Provider Number: [id number]  Attending Physician Name and Address:  Albertine Grates, MD  Relative Name and Phone Number:  Marylu Lund [phone]    Current Level of Care: Hospital Recommended Level of Care: Skilled Nursing Facility Prior Approval Number:    Date Approved/Denied:   PASRR Number: [id number]  Discharge Plan: SNF    Current Diagnoses: Patient Active Problem List   Diagnosis Date Noted   Acute kidney injury superimposed on chronic kidney disease (HCC) 11/21/2021   Left knee pain 11/21/2021   Acute metabolic encephalopathy 11/21/2021   Pleural effusion on left 11/21/2021   Sepsis (HCC) 06/13/2020   CAP (community acquired pneumonia) 06/13/2020   Fever 06/12/2020   SIRS (systemic inflammatory response syndrome) (HCC) 06/12/2020   Fall at home, initial encounter 06/12/2020   Hypotension 06/12/2020   Hypokalemia 06/12/2020   Hyponatremia 06/12/2020   Weakness 06/12/2020    Orientation RESPIRATION BLADDER Height & Weight     Self, Time, Situation, Place  Normal Incontinent Weight: 155 lb (70.3 kg) Height:  5\' 3"  (160 cm)  BEHAVIORAL SYMPTOMS/MOOD NEUROLOGICAL BOWEL NUTRITION STATUS      Continent Diet (Please see discharge summary)  AMBULATORY STATUS COMMUNICATION OF NEEDS Skin   Limited Assist Verbally Other (Comment) (WDL)                       Personal Care Assistance Level of Assistance  Bathing, Feeding, Dressing Bathing Assistance: Limited assistance Feeding assistance: Independent (able to feed self) Dressing Assistance: Limited assistance     Functional Limitations Info  Sight, Hearing, Speech Sight Info:  Impaired Hearing Info: Impaired Speech Info: Adequate    SPECIAL CARE FACTORS FREQUENCY  PT (By licensed PT), OT (By licensed OT)     PT Frequency: 5x min weekly OT Frequency: 5x min weekly            Contractures Contractures Info: Not present    Additional Factors Info  Code Status, Allergies Code Status Info: FULL Allergies Info: Codeine           Current Medications (11/23/2021):  This is the current hospital active medication list Current Facility-Administered Medications  Medication Dose Route Frequency Provider Last Rate Last Admin   0.9 %  sodium chloride infusion   Intravenous Continuous 11/25/2021 A, MD 75 mL/hr at 11/22/21 2235 New Bag at 11/22/21 2235   acetaminophen (TYLENOL) tablet 650 mg  650 mg Oral Q6H PRN 2236, MD   650 mg at 11/22/21 0001   Or   acetaminophen (TYLENOL) suppository 650 mg  650 mg Rectal Q6H PRN 11/24/21 A, MD       albuterol (PROVENTIL) (2.5 MG/3ML) 0.083% nebulizer solution 2.5 mg  2.5 mg Nebulization Q6H PRN Madelyn Flavors A, MD       artificial tears (LACRILUBE) ophthalmic ointment 1 Application  1 Application Both Eyes PRN Madelyn Flavors A, MD       diclofenac Sodium (VOLTAREN) 1 % topical gel 2 g  2 g Topical QID Smith, Rondell A, MD   2 g at 11/22/21 1336   enoxaparin (LOVENOX) injection 40 mg  40 mg Subcutaneous Q24H Meyer,  [place] Calix, RPH   40 mg at 11/23/21 0932   famotidine (PEPCID) tablet 10 mg  10 mg Oral Daily Albertine Grates, MD   10 mg at 11/23/21 0932   lip balm (CARMEX) ointment 1 Application  1 Application Topical PRN Clydie Braun, MD   1 Application at 11/22/21 0117     Discharge Medications: Please see discharge summary for a list of discharge medications.  Relevant Imaging Results:  Relevant Lab Results:   Additional Information [id number]  Delilah Shan, LCSWA

## 2021-11-23 NOTE — TOC Progression Note (Addendum)
Transition of Care [facility]) - Progression Note    Patient Details  Name: Gina [name] MRN: [id number] Date of Birth: [birth year]  Transition of Care [facility]) CM/SW Contact  Delilah Shan, LCSWA Phone Number: 11/23/2021, 4:47 PM  Clinical Narrative:     CSW provided medicare compare ratings list with accepted SNF bed offers to patient for review. Patient request for CSW to call her daughter Gina [name] to help with SNF choice. CSW spoke with patients daughter Gina [name]. Gina [name] informed CSW she will call CSW back after talking over SNF bed offers with patient. CSW started insurance authorization for patient reference # [id number].CSW will continue to follow.   Expected Discharge Plan: Skilled Nursing Facility Barriers to Discharge: Continued Medical Work up  Expected Discharge Plan and Services Expected Discharge Plan: Skilled Nursing Facility In-house Referral: Clinical Social Work     Living arrangements for the past 2 months: Single Family Home                                       Social Determinants of Health (SDOH) Interventions    Readmission Risk Interventions     No data to display

## 2021-11-24 DIAGNOSIS — W19XXXA Unspecified fall, initial encounter: Secondary | ICD-10-CM | POA: Diagnosis not present

## 2021-11-24 DIAGNOSIS — Y92009 Unspecified place in unspecified non-institutional (private) residence as the place of occurrence of the external cause: Secondary | ICD-10-CM | POA: Diagnosis not present

## 2021-11-24 DIAGNOSIS — I959 Hypotension, unspecified: Secondary | ICD-10-CM | POA: Diagnosis not present

## 2021-11-24 LAB — BASIC METABOLIC PANEL
Anion gap: 6 (ref 5–15)
BUN: 14 mg/dL (ref 8–23)
CO2: 19 mmol/L — ABNORMAL LOW (ref 22–32)
Calcium: 9.3 mg/dL (ref 8.9–10.3)
Chloride: 115 mmol/L — ABNORMAL HIGH (ref 98–111)
Creatinine, Ser: 0.94 mg/dL (ref 0.44–1.00)
GFR, Estimated: 59 mL/min — ABNORMAL LOW (ref >60)
Glucose, Bld: 96 mg/dL (ref 70–99)
Potassium: 3.6 mmol/L (ref 3.5–5.1)
Sodium: 140 mmol/L (ref 135–145)

## 2021-11-24 LAB — URINE CULTURE: Culture: 1000 — AB

## 2021-11-24 LAB — MAGNESIUM: Magnesium: 1.7 mg/dL (ref 1.7–2.4)

## 2021-11-24 LAB — TSH: TSH: 1.364 u[IU]/mL (ref 0.350–4.500)

## 2021-11-24 LAB — T4, FREE: Free T4: 1.17 ng/dL — ABNORMAL HIGH (ref 0.61–1.12)

## 2021-11-24 MED ORDER — SENNOSIDES-DOCUSATE SODIUM 8.6-50 MG PO TABS: 1.0000 | ORAL_TABLET | Freq: Every day | ORAL | Status: DC

## 2021-11-24 MED ORDER — VALSARTAN 40 MG PO TABS: 40.0000 mg | ORAL_TABLET | Freq: Every day | ORAL | 0 refills | Status: DC

## 2021-11-24 MED ORDER — VALSARTAN 40 MG PO TABS
40.0000 mg | ORAL_TABLET | Freq: Every day | ORAL | 0 refills | Status: DC
Start: 2021-11-24 — End: 2024-02-15

## 2021-11-24 NOTE — TOC Transition Note (Signed)
Transition of Care [facility]) - CM/SW Discharge Note   Patient Details  Name: Gina [name] MRN: [id number] Date of Birth: 17-Oct-1932  Transition of Care [facility]) CM/SW Contact:  Gala Lewandowsky, RN Phone Number: 11/24/2021, 2:21 PM   Clinical Narrative:  Daughter has decided to take the patient to her home with home health services. Case Manager discussed agencies with the daughter and she did not have a preference. Case Manager called [facility] and they cannot service the patient at this time 2/2 staffing. Frances Furbish can service the patient for RN/PT/OT/Aide Services and start of care to begin within 24-48 hours post transition home. Daughter states patient will transition to her home [address] [place] [address]. Daughter will be contact and the agency is aware. Daughter to transport patient home via private vehicle. No further needs identified at this time.    Final next level of care: Home w Home Health Services Barriers to Discharge: No Barriers Identified   Patient Goals and CMS Choice Patient states their goals for this hospitalization and ongoing recovery are:: Daughter to take the patient to her home CMS Medicare.gov Compare Post Acute Care list provided to:: Patient Choice offered to / list presented to : Adult Children   Discharge Plan and Services In-house Referral: Clinical Social Work Discharge Planning Services: CM Consult Post Acute Care Choice: Home Health          DME Arranged: N/A DME Agency: NA       HH Arranged: RN, Disease Management, PT, OT, Nurse's Aide HH Agency: [facility] Health Care Date [facility] Agency Contacted: 11/24/21 Time HH Agency Contacted: 1421 Representative spoke with at [facility] Agency: Kandee Keen  Readmission Risk Interventions     No data to display

## 2021-11-24 NOTE — Progress Notes (Signed)
Mobility Specialist Progress Note:   11/24/21 1300  Mobility  Activity Ambulated with assistance in hallway  Level of Assistance Contact guard assist, steadying assist  Assistive Device Cane  Distance Ambulated (ft) 70 ft  Activity Response Tolerated well  $Mobility charge 1 Mobility   Pt in chair and agreeable. No complaints. Pt in chair with all needs met and call bell in reach.   Joselinne Lawal Mobility Specialist-Acute Rehab Secure Chat only

## 2021-11-24 NOTE — Discharge Summary (Addendum)
Discharge Summary  Gina [name] UVO:[id number] DOB: 1933/03/29  PCP: Adolph Pollack, FNP  Admit date: 11/20/2021 Discharge date: 11/24/2021      Time spent: , more than 50% time spent on coordination of care.   Recommendations for Outpatient Follow-up:  F/u with  pcp for hospital discharge follow up, repeat cbc/bmp at follow up, patient is advised to check blood pressure at home and bring in record for pcp to review, further bp meds adjustment per pcp  Addendum: patient and family has decided to pursue home with home health instead of snf, home health order placed  F/u with pcp next week    Discharge Diagnoses:  Active Hospital Problems   Diagnosis Date Noted   Fall at home, initial encounter 06/12/2020    Priority: 1.   Left knee pain 11/21/2021    Priority: 1.   Hypotension 06/12/2020    Priority: 2.   Acute metabolic encephalopathy 11/21/2021    Priority: 3.   Acute kidney injury superimposed on chronic kidney disease (HCC) 11/21/2021    Priority: 4.   Hyponatremia 06/12/2020    Priority: 5.   Pleural effusion on left 11/21/2021    Priority: 6.   Weakness 06/12/2020    Priority: 7.    Resolved Hospital Problems  No resolved problems to display.    Discharge Condition: stable  Diet recommendation: heart healthy  Filed Weights   11/20/21 2057  Weight: 70.3 kg    History of present illness: ( per admitting MD Dr Katrinka Blazing) HPI: Gina [name] is a 88 y.o. female with medical history significant of hypertension, CKD stage III, Hard of hearing, Bell's palsy on the left, and GERD who presents after having a fall at home yesterday evening.  Patient reports that she fell and hurt her left hip and hand, but then stated I need to talk to her mother who is currently at home as she stayed up all night.  It reported that the patient at baseline has been living at home alone, ambulates with walking cane, drives short distances, and handles her own finances. Her  daughter checks in on her ever other day and provides meals from time to time .  Patient told ems she slid of the sofa because her left knee gave out and complained of left knee pain.  Daughter provides additional history and states that it is the patient's bad knee for which she has appointment in September already set up with orthopedics.  Daughter notes that they just dropped her off from dinner couple hours before they received a call that she had fallen.  Early in the day the patient's daughter had noted that she was talking little finding, but she was fine during dinner.  She had just recently been diagnosed with a pansensitive E. coli urinary tract infection and had been started on Macrobid 2 days ago, but daughter reports that she probably only had given 2 doses of the medication.  She has been complaining of fatigue recently for which she had been seen by her primary care provider and they checked the urine culture.  Daughter notes patient had a similar hospitalization last year after having a fall where she was found to have concern for community-acquired pneumonia and hypotension which was resolved with IV fluids and antibiotics.  Since being in the hospital her daughter reports that patient had developed a cough.   On admission into the emergency department patient was to be febrile up to 101.1 degrees axillary.  Report blood pressures 94/35 to 124/59, and all other vital signs maintained.  Labs significant for sodium 134, BUN 28, and creatinine 1.41.  X-rays of the left knee noted advanced tricompartmental degenerative arthritis without acute fracture..  Chest x-ray noted left-sided pleural effusion.  Urinalysis did not note any acute abnormality.  Patient had initially been given empiric antibiotics of Rocephin and 2 L of normal saline IV fluids.  Hospital Course:  Principal Problem:   Fall at home, initial encounter Active Problems:   Left knee pain   Hypotension   Acute metabolic  encephalopathy   Acute kidney injury superimposed on chronic kidney disease (HCC)   Hyponatremia   Pleural effusion on left   Weakness   Assessment and Plan:  UTI/encephalopathy/transient hypotension [name] -CT head no acute findings -confusion resolved, blood pressure stabilized  -Improved with IV Rocephin x3, blood culture no growth ,urine culture no growth, though she reported urinary symptoms, she was started on antibiotic prior to coming to the hospital   Acute kidney injury superimposed on chronic kidney stage IIIa: Likely from UTI and dehydration  Creatinine normalized  Home medication HCTZ and Diovan held in the hospital, resumed on lower dose at discharge Repeat bmp next week   Hypovolemic hyponatremia: Resolved with fluid resuscitation.     . CT angio of the chest showed mild bilateral pleural effusions /atelectasis ,no infiltrates or acute findings no PE. She is on room air, in no respiratory symptoms Continue home meds daily HCTZ, monitor volume status      FTT Family reports she slide down from sitting position due to weakness, denies hard fall at home X-ray of the knee showed advanced osteoarthritis no fractures. X-ray of her pelvis showed no acute findings. Started on Voltaren gel. She has a follow-up with orthopedics in September, consult physical therapy. PT/OT eval recommends SNF placement patient and family in agreement initially, later decided to go home with home health    Bell's palsy: Noted.   Discharge Exam: BP (!) 135/57 (BP Location: Right Arm)   Pulse 70   Temp 98.1 F (36.7 C) (Oral)   Resp 15   Ht 5\' 3"  (1.6 m)   Wt 70.3 kg   SpO2 96%   BMI 27.46 kg/m   General: frail, NAD, hard of hearing but AAOx3 Cardiovascular: RRR Respiratory: normal respiratory effort     Discharge Instructions     Diet - low sodium heart healthy   Complete by: As directed    Increase activity slowly   Complete by: As directed       Allergies  as of 11/24/2021       Reactions   Codeine    Other reaction(s): Other (See Comments) Insomnia Insomnia        Medication List     STOP taking these medications    nitrofurantoin (macrocrystal-monohydrate) 100 MG capsule Commonly known as: MACROBID       TAKE these medications    hydrochlorothiazide 12.5 MG tablet Commonly known as: HYDRODIURIL Take 12.5 mg by mouth every other day.   omeprazole 20 MG capsule Commonly known as: PRILOSEC Take 20 mg by mouth daily.   senna-docusate 8.6-50 MG tablet Commonly known as: Senokot-S Take 1 tablet by mouth at bedtime.   SYSTANE OP Place 1 drop into both eyes daily.   valsartan 40 MG tablet Commonly known as: Diovan Take 1 tablet (40 mg total) by mouth daily. Hold if sbp less than 100 What changed:  medication strength how much to  take additional instructions       Allergies  Allergen Reactions   Codeine     Other reaction(s): Other (See Comments) Insomnia Insomnia     Follow-up Information     Lorenda Hatchet, FNP Follow up in 1 week(s).   Specialty: Family Medicine Why: hospital discharge follow up, repeat basic lab works including cbc/bmp Contact information: [id number] [place] [address] [phone]         Care, [facility] Follow up.   Specialty: Home Health Services Why: Registered Nurse, Physical/Occupational Therapy, Aide. Office to call with visit times. Contact information: [place] [address] [phone]                  The results of significant diagnostics from this hospitalization (including imaging, microbiology, ancillary and laboratory) are listed below for reference.    Significant Diagnostic Studies: CT Angio Chest Pulmonary Embolism (PE) W or WO Contrast  Result Date: 11/21/2021 CLINICAL DATA:  Pulmonary embolism (PE) suspected, positive D-dimer EXAM: CT ANGIOGRAPHY CHEST WITH CONTRAST TECHNIQUE:  Multidetector CT imaging of the chest was performed using the standard protocol during bolus administration of intravenous contrast. Multiplanar CT image reconstructions and MIPs were obtained to evaluate the vascular anatomy. RADIATION DOSE REDUCTION: This exam was performed according to the departmental dose-optimization program which includes automated exposure control, adjustment of the mA and/or kV according to patient size and/or use of iterative reconstruction technique. CONTRAST:  83mL OMNIPAQUE IOHEXOL 350 MG/ML SOLN COMPARISON:  CT 06/18/2020. FINDINGS: Cardiovascular: Satisfactory opacification of the pulmonary arteries to the segmental level. There is streak artifact, which somewhat obscures the segmental and subsegmental branches of the right upper lobe. Within this limitation, there is no evidence of pulmonary embolism.Mild cardiomegaly. No pericardial disease. Mild atherosclerosis of the thoracic aorta. Mediastinum/Nodes: No lymphadenopathy. The thyroid is unremarkable. Esophagus is unremarkable. Lungs/Pleura: Airways are patent. Trace bilateral pleural effusions with adjacent basilar atelectasis. No pneumothorax. No suspicious nodules Upper Abdomen: There is a low-density 2.1 cm left adrenal nodule measuring -0.8 Hounsfield units consistent with a benign adenoma, unchanged from prior exam in March 2022. Musculoskeletal: No acute osseous abnormality. No suspicious osseous lesion. Prior right shoulder arthroplasty. Unchanged T8 vertebral body hemangioma. Review of the MIP images confirms the above findings. IMPRESSION: No evidence of pulmonary embolism. Trace bilateral pleural effusions with adjacent basilar atelectasis. Stable left adrenal nodule consistent with a benign adenoma. Electronically Signed   By: Maurine Simmering M.D.   On: 11/21/2021 16:59   DG Pelvis 1-2 Views  Result Date: 11/21/2021 CLINICAL DATA:  Trauma, fall EXAM: PELVIS - 1-2 VIEW COMPARISON:  06/11/2020 FINDINGS: No displaced  fracture is seen. Degenerative changes are noted in both hips. There is no dislocation. There is contrast in the collecting systems from previous CT. IMPRESSION: No displaced fracture or dislocation is seen in pelvis. Electronically Signed   By: Elmer Picker M.D.   On: 11/21/2021 16:50   CT HEAD WO CONTRAST (5MM)  Result Date: 11/21/2021 CLINICAL DATA:  Mental status change, unknown cause EXAM: CT HEAD WITHOUT CONTRAST TECHNIQUE: Contiguous axial images were obtained from the base of the skull through the vertex without intravenous contrast. RADIATION DOSE REDUCTION: This exam was performed according to the departmental dose-optimization program which includes automated exposure control, adjustment of the mA and/or kV according to patient size and/or use of iterative reconstruction technique. COMPARISON:  Head CT 06/11/2020 FINDINGS: Brain: No evidence of acute intracranial hemorrhage or extra-axial  collection.Chronic lacunar infarct in the right basal ganglia. Patent basal cisterns. No concerning mass effect.The ventricles are unchanged in size.Scattered subcortical and periventricular white matter hypodensities, nonspecific but likely sequela of chronic small vessel ischemic disease. Vascular: Vascular calcifications.  No hyperdense vessel. Skull: Negative for skull fracture. Sinuses/Orbits: Minimal paranasal sinus mucosal thickening. Orbits are unremarkable. Mastoid air cells are clear. Other: None. IMPRESSION: No acute intracranial abnormality. Unchanged chronic lacunar infarct in right basal ganglia Unchanged additional sequela of chronic small vessel ischemic disease. Electronically Signed   By: Maurine Simmering M.D.   On: 11/21/2021 16:46   DG Chest 2 View  Result Date: 11/20/2021 CLINICAL DATA:  Altered mental status EXAM: CHEST - 2 VIEW COMPARISON:  Radiographs 06/18/2020 FINDINGS: Left basilar atelectasis/scarring. Small left pleural effusion. No pneumothorax. Stable cardiomediastinal silhouette.  Aortic atherosclerotic calcification. Elevated right hemidiaphragm. No acute osseous abnormality. IMPRESSION: Small left pleural effusion. Electronically Signed   By: Placido Sou M.D.   On: 11/20/2021 22:05   DG Knee Complete 4 Views Left  Result Date: 11/20/2021 CLINICAL DATA:  Left knee gave out EXAM: LEFT KNEE - COMPLETE 4+ VIEW COMPARISON:  Radiographs 06/12/2020 FINDINGS: No significant change from prior. No fracture or dislocation. Severe tricompartmental degenerative arthritis with joint space narrowing and osteophyte formation. Trace knee joint effusion. Soft tissues are unremarkable. IMPRESSION: No acute fracture or dislocation. Advanced tricompartmental degenerative arthritis. Electronically Signed   By: Placido Sou M.D.   On: 11/20/2021 22:03    Microbiology: Recent Results (from the past 240 hour(s))  Urine Culture     Status: Abnormal   Collection Time: 11/20/21  3:28 AM   Specimen: In/Out Cath Urine  Result Value Ref Range Status   Specimen Description IN/OUT CATH URINE  Final   Special Requests ADDED 0328 11/21/2021  Final   Culture (A)  Final    1,000 COLONIES/mL DIPHTHEROIDS(CORYNEBACTERIUM SPECIES) Standardized susceptibility testing for this organism is not available. Performed at [facility], [place] [address], [place], [place] [address]    Report Status 11/24/2021 FINAL  Final  Culture, blood (Routine X 2) w Reflex to ID Panel     Status: None (Preliminary result)   Collection Time: 11/21/21  1:23 PM   Specimen: BLOOD  Result Value Ref Range Status   Specimen Description BLOOD BLOOD LEFT FOREARM  Final   Special Requests   Final    BOTTLES DRAWN AEROBIC AND ANAEROBIC Blood Culture results may not be optimal due to an inadequate volume of blood received in culture bottles   Culture   Final    NO GROWTH 3 DAYS Performed at [facility], [place] [address], [place], [place] [address]    Report Status PENDING  Incomplete  Culture, blood (Routine X 2) w  Reflex to ID Panel     Status: None (Preliminary result)   Collection Time: 11/21/21  2:46 PM   Specimen: BLOOD  Result Value Ref Range Status   Specimen Description BLOOD BLOOD RIGHT FOREARM  Final   Special Requests   Final    BOTTLES DRAWN AEROBIC AND ANAEROBIC Blood Culture results may not be optimal due to an inadequate volume of blood received in culture bottles   Culture   Final    NO GROWTH 3 DAYS Performed at [facility], [place] [address], [place], [place] [address]    Report Status PENDING  Incomplete     Labs: Basic Metabolic Panel: Recent Labs  Lab 11/20/21 2240 11/22/21 0329 11/24/21 0355  NA 134* 136 140  K 3.6 3.5 3.6  CL 105 111 115*  CO2 21* 20* 19*  GLUCOSE 103* 88 96  BUN 28* 19 14  CREATININE 1.41* 1.02* 0.94  CALCIUM 10.0 8.9 9.3  MG  --   --  1.7   Liver Function Tests: Recent Labs  Lab 11/20/21 2240  AST 22  ALT 12  ALKPHOS 68  BILITOT 1.0  PROT 6.9  ALBUMIN 3.5   No results for input(s): "LIPASE", "AMYLASE" in the last 168 hours. No results for input(s): "AMMONIA" in the last 168 hours. CBC: Recent Labs  Lab 11/20/21 2240 11/22/21 0329  WBC 8.0 4.9  HGB 14.5 12.5  HCT 41.6 35.1*  MCV 93.9 92.6  PLT 158 143*   Cardiac Enzymes: Recent Labs  Lab 11/21/21 1441  CKTOTAL 242*   BNP: BNP (last 3 results) No results for input(s): "BNP" in the last 8760 hours.  ProBNP (last 3 results) No results for input(s): "PROBNP" in the last 8760 hours.  CBG: No results for input(s): "GLUCAP" in the last 168 hours.  FURTHER DISCHARGE INSTRUCTIONS:   Get Medicines reviewed and adjusted: Please take all your medications with you for your next visit with your Primary MD   Laboratory/radiological data: Please request your Primary MD to go over all hospital tests and procedure/radiological results at the follow up, please ask your Primary MD to get all Hospital records sent to his/her office.   In some cases, they will be blood work,  cultures and biopsy results pending at the time of your discharge. Please request that your primary care M.D. goes through all the records of your hospital data and follows up on these results.   Also Note the following: If you experience worsening of your admission symptoms, develop shortness of breath, life threatening emergency, suicidal or homicidal thoughts you must seek medical attention immediately by calling 911 or calling your MD immediately  if symptoms less severe.   You must read complete instructions/literature along with all the possible adverse reactions/side effects for all the Medicines you take and that have been prescribed to you. Take any new Medicines after you have completely understood and accpet all the possible adverse reactions/side effects.    Do not drive when taking Pain medications or sleeping medications (Benzodaizepines)   Do not take more than prescribed Pain, Sleep and Anxiety Medications. It is not advisable to combine anxiety,sleep and pain medications without talking with your primary care practitioner   Special Instructions: If you have smoked or chewed Tobacco  in the last 2 yrs please stop smoking, stop any regular Alcohol  and or any Recreational drug use.   Wear Seat belts while driving.   Please note: You were cared for by a hospitalist during your hospital stay. Once you are discharged, your primary care physician will handle any further medical issues. Please note that NO REFILLS for any discharge medications will be authorized once you are discharged, as it is imperative that you return to your primary care physician (or establish a relationship with a primary care physician if you do not have one) for your post hospital discharge needs so that they can reassess your need for medications and monitor your lab values.     Signed:  Florencia Reasons MD, PhD, FACP  [facility] 11/24/2021, 3:57 PM

## 2021-11-24 NOTE — Plan of Care (Signed)

## 2021-11-24 NOTE — TOC Progression Note (Addendum)
Transition of Care [facility]) - Progression Note    Patient Details  Name: Gina [name] MRN: [id number] Date of Birth: 04-24-1932  Transition of Care [facility]) CM/SW Contact  Delilah Shan, LCSWA Phone Number: 11/24/2021, 8:50 AM  Clinical Narrative:     Update-2:08pm- CSW received callback from [facility] with [facility] who confirmed no bed available today for patient. CSW informed patients daughter Gina [name]. Gina [name] confirmed she would like to take patient home with [facility] services. CM to follow up with Gina [name] to set up [facility] services for patient.  Update- 12:09pm- Patients daughter called CSW back undecided if she wants HH vrs. SNF. Daughter request for CSW to fax to [facility] in [place]. CSW informed patients daughter CSW cancelled patients insurance authorization due to plans changing to patient going home with hh services. CSW informed patients daughter if plan for SNF will have to try and secure bed for today and restart insurance authorization. CSW faxed referral to [facility] in [place]. Verlon Au with [facility] is going to review and see if she can offer SNF bed for patient. CSW awaiting determination, if plan for snf will need to restart insurance authorization for patient. Patients daughter wanted me to check and see if [facility] has any beds today. CSW spoke with French Ana with [facility] who confirmed they do not have any SNF beds for today.  Update- CSW received call from patients daughter. Patients daughter confirmed after speaking with patient plan is for patient to go home. Patient is interested in [facility] services. CSW informed CM,MD, and Nicki with [facility]. CSW cancelled patients insurance authorization for SNF.  CSW spoke with patients daughter Gina [name] who accepted SNF bed for patient with [facility]. CSW spoke with Nicki with [facility] who confirmed SNF bed offer for patient. Patients insurance authorization is currently pending. CSW will continue to follow and  assist with patients dc planning needs.   Expected Discharge Plan: Skilled Nursing Facility Barriers to Discharge: Continued Medical Work up  Expected Discharge Plan and Services Expected Discharge Plan: Skilled Nursing Facility In-house Referral: Clinical Social Work     Living arrangements for the past 2 months: Single Family Home                                       Social Determinants of Health (SDOH) Interventions    Readmission Risk Interventions     No data to display

## 2021-11-24 NOTE — Progress Notes (Signed)
Physical Therapy Treatment Patient Details Name: Gina [name] MRN: [id number] DOB: 12-May-1932 Today's Date: 11/24/2021   History of Present Illness Pt is an 88 yr old female who presented 11/20/21 due to sliding off the sofa due to her L knee giving out and c/o L hip pain. Pt recently found to have an UTI. chest x ray has shown L sided pleural effusion and x ray of L knee showed arthritis without fxs.  PMH: HTN, CKD stage 3, GERD, R Foot sx, shoulder sx, Bells palsy    PT Comments    Pt tolerates treatment well, ambulating for increased distances and participating in multiple transfers. Pt continues to benefit from verbal cueing to improve transfer technique and efficiency. Pt will benefit from further PT services in an effort to improve activity tolerance and independence. PT continues to recommend SNF placement.   Recommendations for follow up therapy are one component of a multi-disciplinary discharge planning process, led by the attending physician.  Recommendations may be updated based on patient status, additional functional criteria and insurance authorization.  Follow Up Recommendations  Skilled nursing-short term rehab (<3 hours/day) Can patient physically be transported by private vehicle: Yes   Assistance Recommended at Discharge Intermittent Supervision/Assistance  Patient can return home with the following A little help with bathing/dressing/bathroom;Assistance with cooking/housework;Assist for transportation;Help with stairs or ramp for entrance;A little help with walking and/or transfers   Equipment Recommendations  None recommended by PT    Recommendations for Other Services       Precautions / Restrictions Precautions Precautions: Fall Precaution Comments: decrease in L knee stability Restrictions Weight Bearing Restrictions: No     Mobility  Bed Mobility Overal bed mobility: Needs Assistance Bed Mobility: Supine to Sit     Supine to sit: Supervision, HOB  elevated     General bed mobility comments: use of rails    Transfers Overall transfer level: Needs assistance Equipment used: Rolling walker (2 wheels) Transfers: Sit to/from Stand, Bed to chair/wheelchair/BSC Sit to Stand: Min guard, Min assist   Step pivot transfers: Min guard       General transfer comment: verbal cues for hand placement and increased trunk flexion    Ambulation/Gait Ambulation/Gait assistance: Min guard Gait Distance (Feet): 60 Feet (additional trial of 10') Assistive device: Rolling walker (2 wheels) Gait Pattern/deviations: Step-through pattern Gait velocity: reduced Gait velocity interpretation: <1.8 ft/sec, indicate of risk for recurrent falls   General Gait Details: pt with slowed step-through gait, increased trunk flexion and downward gaze   Stairs             Wheelchair Mobility    Modified Rankin (Stroke Patients Only)       Balance Overall balance assessment: Needs assistance Sitting-balance support: No upper extremity supported, Feet supported Sitting balance-Leahy Scale: Good     Standing balance support: Single extremity supported, Bilateral upper extremity supported, Reliant on assistive device for balance Standing balance-Leahy Scale: Poor                              Cognition Arousal/Alertness: Awake/alert Behavior During Therapy: WFL for tasks assessed/performed Overall Cognitive Status: Within Functional Limits for tasks assessed                                          Exercises      General Comments  General comments (skin integrity, edema, etc.): VSS on RA      Pertinent Vitals/Pain Pain Assessment Pain Assessment: Faces Faces Pain Scale: No hurt    Home Living                          Prior Function            PT Goals (current goals can now be found in the care plan section) Acute Rehab PT Goals Patient Stated Goal: return home Progress towards PT  goals: Progressing toward goals    Frequency    Min 3X/week      PT Plan Current plan remains appropriate    Co-evaluation              AM-PAC PT "6 Clicks" Mobility   Outcome Measure  Help needed turning from your back to your side while in a flat bed without using bedrails?: A Little Help needed moving from lying on your back to sitting on the side of a flat bed without using bedrails?: A Little Help needed moving to and from a bed to a chair (including a wheelchair)?: A Little Help needed standing up from a chair using your arms (e.g., wheelchair or bedside chair)?: A Little Help needed to walk in hospital room?: A Little Help needed climbing 3-5 steps with a railing? : A Lot 6 Click Score: 17    End of Session   Activity Tolerance: Patient tolerated treatment well Patient left: in chair;with call bell/phone within reach;with chair alarm set Nurse Communication: Mobility status PT Visit Diagnosis: Unsteadiness on feet (R26.81);Repeated falls (R29.6);Difficulty in walking, not elsewhere classified (R26.2);Pain Pain - Right/Left: Left Pain - part of body: Knee     Time: 5643-3295 PT Time Calculation (min) (ACUTE ONLY): 29 min  Charges:  $Gait Training: 8-22 mins $Therapeutic Activity: 8-22 mins                     Arlyss Gandy, PT, DPT Acute Rehabilitation Office [phone]    Arlyss Gandy 11/24/2021, 10:37 AM

## 2021-11-24 NOTE — Care Management Important Message (Signed)
Important Message  Patient Details  Name: Gina [name] MRN: [id number] Date of Birth: 1932/04/16   Medicare Important Message Given:  Yes     Renie Ora 11/24/2021, 10:22 AM

## 2021-11-26 LAB — CULTURE, BLOOD (ROUTINE X 2)
Culture: NO GROWTH
Culture: NO GROWTH

## 2022-04-27 ENCOUNTER — Encounter (HOSPITAL_COMMUNITY): Payer: Self-pay | Admitting: Emergency Medicine

## 2022-04-27 ENCOUNTER — Other Ambulatory Visit: Payer: Self-pay

## 2022-04-27 ENCOUNTER — Emergency Department (HOSPITAL_COMMUNITY)
Admission: EM | Admit: 2022-04-27 | Discharge: 2022-04-28 | Disposition: A | Discharge status: Home / Self Care | Payer: Medicare Other | Attending: Emergency Medicine | Admitting: Emergency Medicine

## 2022-04-27 ENCOUNTER — Emergency Department (HOSPITAL_COMMUNITY): Payer: Medicare Other

## 2022-04-27 DIAGNOSIS — R053 Chronic cough: Secondary | ICD-10-CM | POA: Diagnosis not present

## 2022-04-27 DIAGNOSIS — Z20822 Contact with and (suspected) exposure to covid-19: Secondary | ICD-10-CM | POA: Diagnosis not present

## 2022-04-27 DIAGNOSIS — D72829 Elevated white blood cell count, unspecified: Secondary | ICD-10-CM | POA: Insufficient documentation

## 2022-04-27 DIAGNOSIS — N3000 Acute cystitis without hematuria: Secondary | ICD-10-CM | POA: Diagnosis not present

## 2022-04-27 DIAGNOSIS — R531 Weakness: Secondary | ICD-10-CM | POA: Diagnosis present

## 2022-04-27 LAB — CBC WITH DIFFERENTIAL/PLATELET
Abs Immature Granulocytes: 0.1 10*3/uL — ABNORMAL HIGH (ref 0.00–0.07)
Basophils Absolute: 0.1 10*3/uL (ref 0.0–0.1)
Basophils Relative: 0 %
Eosinophils Absolute: 0 10*3/uL (ref 0.0–0.5)
Eosinophils Relative: 0 %
HCT: 42.5 % (ref 36.0–46.0)
Hemoglobin: 15 g/dL (ref 12.0–15.0)
Immature Granulocytes: 1 %
Lymphocytes Relative: 5 %
Lymphs Abs: 0.8 10*3/uL (ref 0.7–4.0)
MCH: 33.6 pg (ref 26.0–34.0)
MCHC: 35.3 g/dL (ref 30.0–36.0)
MCV: 95.3 fL (ref 80.0–100.0)
Monocytes Absolute: 1.4 10*3/uL — ABNORMAL HIGH (ref 0.1–1.0)
Monocytes Relative: 9 %
Neutro Abs: 12.9 10*3/uL — ABNORMAL HIGH (ref 1.7–7.7)
Neutrophils Relative %: 85 %
Platelets: 238 10*3/uL (ref 150–400)
RBC: 4.46 MIL/uL (ref 3.87–5.11)
RDW: 11.9 % (ref 11.5–15.5)
WBC: 15.3 10*3/uL — ABNORMAL HIGH (ref 4.0–10.5)
nRBC: 0 % (ref 0.0–0.2)

## 2022-04-27 LAB — COMPREHENSIVE METABOLIC PANEL
ALT: 10 U/L (ref 0–44)
AST: 20 U/L (ref 15–41)
Albumin: 3.3 g/dL — ABNORMAL LOW (ref 3.5–5.0)
Alkaline Phosphatase: 84 U/L (ref 38–126)
Anion gap: 10 (ref 5–15)
BUN: 21 mg/dL (ref 8–23)
CO2: 22 mmol/L (ref 22–32)
Calcium: 10.1 mg/dL (ref 8.9–10.3)
Chloride: 105 mmol/L (ref 98–111)
Creatinine, Ser: 1.09 mg/dL — ABNORMAL HIGH (ref 0.44–1.00)
GFR, Estimated: 49 mL/min — ABNORMAL LOW (ref >60)
Glucose, Bld: 99 mg/dL (ref 70–99)
Potassium: 3.5 mmol/L (ref 3.5–5.1)
Sodium: 137 mmol/L (ref 135–145)
Total Bilirubin: 1.1 mg/dL (ref 0.3–1.2)
Total Protein: 7.1 g/dL (ref 6.5–8.1)

## 2022-04-27 LAB — URINALYSIS, ROUTINE W REFLEX MICROSCOPIC
Bilirubin Urine: NEGATIVE
Glucose, UA: NEGATIVE mg/dL
Ketones, ur: 5 mg/dL — AB
Nitrite: POSITIVE — AB
Protein, ur: 100 mg/dL — AB
Specific Gravity, Urine: 1.019 (ref 1.005–1.030)
WBC, UA: >50 WBC/hpf — ABNORMAL HIGH (ref 0–5)
pH: 5 (ref 5.0–8.0)

## 2022-04-27 LAB — RESP PANEL BY RT-PCR (RSV, FLU A&B, COVID)  RVPGX2
Influenza A by PCR: NEGATIVE
Influenza B by PCR: NEGATIVE
Resp Syncytial Virus by PCR: NEGATIVE
SARS Coronavirus 2 by RT PCR: NEGATIVE

## 2022-04-27 MED ORDER — SODIUM CHLORIDE 0.9 % IV BOLUS
1000.0000 mL | Freq: Once | INTRAVENOUS | Status: AC
  Administered 2022-04-28: 1000 mL via INTRAVENOUS

## 2022-04-27 MED ORDER — BENZONATATE 100 MG PO CAPS
100.0000 mg | ORAL_CAPSULE | Freq: Once | ORAL | Status: AC
  Administered 2022-04-28: 100 mg via ORAL
  Filled 2022-04-27: qty 1

## 2022-04-27 MED ORDER — ALBUTEROL SULFATE HFA 108 (90 BASE) MCG/ACT IN AERS
2.0000 | INHALATION_SPRAY | Freq: Once | RESPIRATORY_TRACT | Status: AC
  Administered 2022-04-27: 2 via RESPIRATORY_TRACT
  Filled 2022-04-27: qty 6.7

## 2022-04-27 MED ORDER — LEVOFLOXACIN IN D5W 750 MG/150ML IV SOLN
750.0000 mg | Freq: Once | INTRAVENOUS | Status: AC
  Administered 2022-04-28: 750 mg via INTRAVENOUS
  Filled 2022-04-27: qty 150

## 2022-04-27 NOTE — ED Triage Notes (Addendum)
Pt bib [facility] from urgent care where she was being seen for a cough x4 days. Urgent care called ems because patient was weak and tachycardiac at 114. Per pt's daughter, pt has had a strong odor in urine today. EMS reports pt is alert and oriented x4 with repetitive questioning which daughter states is similar to her last UTI. Denies pain with urination. Endorses frequency. 18g L forearm. 532ml NS given PTA.   BP 138/50, HR 90, RR 38, CBG 115

## 2022-04-27 NOTE — ED Provider Notes (Signed)
[facility] EMERGENCY DEPARTMENT Provider Note   CSN: [id number] Arrival date & time: 04/27/22  1919     History  Chief Complaint  Patient presents with   Weakness    Gina [name] is a 89 y.o. female.  89 yo F here with a few days of persistent cough, generalized weakness and mild confusion.  Daughter states that she gets it sometimes when she has a urinary tract infection but the patient's main complaint is cough and dry mouth and she is hungry.  No fevers at home.  Is had multiple sick contacts with URI type symptoms.  No history of smoking.  Initially the cough was dry and now is productive.  No recent travels.   Weakness      Home Medications Prior to Admission medications   Medication Sig Start Date End Date Taking? Authorizing Provider  benzonatate (TESSALON) 100 MG capsule Take 1 capsule (100 mg total) by mouth 3 (three) times daily as needed for cough. 04/28/22  Yes Katilynn Sinkler, Corene Cornea, MD  levofloxacin (LEVAQUIN) 500 MG tablet Take 1 tablet (500 mg total) by mouth daily. 04/28/22  Yes Augusta Hilbert, Corene Cornea, MD  hydrochlorothiazide (HYDRODIURIL) 12.5 MG tablet Take 12.5 mg by mouth every other day. 04/05/20   [provider]  omeprazole (PRILOSEC) 20 MG capsule Take 20 mg by mouth daily. 04/05/20   [provider]  Polyethyl Glycol-Propyl Glycol (SYSTANE OP) Place 1 drop into both eyes daily.    [provider]  senna-docusate (SENOKOT-S) 8.6-50 MG tablet Take 1 tablet by mouth at bedtime. 11/24/21   Florencia Reasons, MD  valsartan (DIOVAN) 40 MG tablet Take 1 tablet (40 mg total) by mouth daily. Hold if sbp less than 100 11/24/21 11/24/22  Florencia Reasons, MD      Allergies    Codeine and Strawberry extract    Review of Systems   Review of Systems  Neurological:  Positive for weakness.    Physical Exam Updated Vital Signs BP (!) 105/44   Pulse 80   Temp 98.1 F (36.7 C) (Oral)   Resp (!) 38   Ht 5\' 3"  (1.6 m)   Wt 68 kg   SpO2 95%   BMI  26.57 kg/m  Physical Exam Vitals and nursing note reviewed.  Constitutional:      Appearance: She is well-developed.  HENT:     Head: Normocephalic and atraumatic.     Mouth/Throat:     Mouth: Mucous membranes are moist.  Eyes:     Pupils: Pupils are equal, round, and reactive to light.  Cardiovascular:     Rate and Rhythm: Normal rate and regular rhythm.  Pulmonary:     Effort: No respiratory distress.     Breath sounds: No stridor. Wheezing (Mild end expiratory) present.  Abdominal:     General: Abdomen is flat. There is no distension.  Musculoskeletal:        General: No swelling or tenderness. Normal range of motion.     Cervical back: Normal range of motion.  Skin:    General: Skin is warm and dry.  Neurological:     General: No focal deficit present.     Mental Status: She is alert.     ED Results / Procedures / Treatments   Labs (all labs ordered are listed, but only abnormal results are displayed) Labs Reviewed  URINALYSIS, ROUTINE W REFLEX MICROSCOPIC - Abnormal; Notable for the following components:      Result Value   Color, Urine  AMBER (*)    APPearance CLOUDY (*)    Hgb urine dipstick SMALL (*)    Ketones, ur 5 (*)    Protein, ur 100 (*)    Nitrite POSITIVE (*)    Leukocytes,Ua MODERATE (*)    WBC, UA >50 (*)    Bacteria, UA MANY (*)    All other components within normal limits  CBC WITH DIFFERENTIAL/PLATELET - Abnormal; Notable for the following components:   WBC 15.3 (*)    Neutro Abs 12.9 (*)    Monocytes Absolute 1.4 (*)    Abs Immature Granulocytes 0.10 (*)    All other components within normal limits  COMPREHENSIVE METABOLIC PANEL - Abnormal; Notable for the following components:   Creatinine, Ser 1.09 (*)    Albumin 3.3 (*)    GFR, Estimated 49 (*)    All other components within normal limits  RESP PANEL BY RT-PCR (RSV, FLU A&B, COVID)  RVPGX2  URINE CULTURE    EKG None  Radiology DG Chest 2 View  Result Date: 04/27/2022 CLINICAL  DATA:  Cough for 3 days EXAM: CHEST - 2 VIEW COMPARISON:  11/20/2021 FINDINGS: Heart size and pulmonary vascularity are normal. Mild atelectasis in the lung bases. No airspace disease or consolidation in the lungs. No pleural effusions. No pneumothorax. Mediastinal contours appear intact. Calcification of the aorta. Colonic interposition under the right hemidiaphragm. Postoperative changes in the right shoulder. IMPRESSION: Mild linear atelectasis in the lung bases.  No focal consolidation. Electronically Signed   By: Burman Nieves M.D.   On: 04/27/2022 20:29    Procedures Procedures    Medications Ordered in ED Medications  levofloxacin (LEVAQUIN) IVPB 750 mg (0 mg Intravenous Stopped 04/28/22 0158)  sodium chloride 0.9 % bolus 1,000 mL (0 mLs Intravenous Stopped 04/28/22 0158)  benzonatate (TESSALON) capsule 100 mg (100 mg Oral Given 04/28/22 0013)  albuterol (VENTOLIN HFA) 108 (90 Base) MCG/ACT inhaler 2 puff (2 puffs Inhalation Given 04/27/22 2328)  lactated ringers bolus 500 mL (0 mLs Intravenous Stopped 04/28/22 0258)    ED Course/ Medical Decision Making/ A&P                             Medical Decision Making Risk Prescription drug management.  Review of her labs show leukocytosis and signs consistent with urinary tract infection.  Chest x-ray independently viewed and interpreted by myself does show some haziness and couple areas this to go along with her and expiratory wheezing will go ahead and treat her with Levaquin that should cover UTI and commune acquired pneumonia.  Will give her an albuterol, Tessalon elevated fluids.  Will let her eat and drink and reassess for disposition but overall patient appears well I think they can probably go home with close PCP follow-up. Patient feeling much better. BP slightly soft but sleeping. Will recheck. Ambulate and reevaluate for disposition.  Blood pressures are similar to previous in the system.  She feels much better.  She is able to  ambulate at baseline.  Of note it says that she has a respiratory rate of 38 on her discharge vital signs however on my final examination and multiple examinations (at least 4) she was never tachypneic.  I do not know if this is an error or was not charted correctly but I have low suspicion for any kind of respiratory distress.  She has been started on antibiotics she is tolerating p.o. her mental status is normal and  her daughter is going to be with her for the next day or 2 to ensure continued improvement.  Will follow-up with her doctor in the next week or so to evaluate her urine to make sure that improved.  Will return here for any new or worsening symptoms.  Final Clinical Impression(s) / ED Diagnoses Final diagnoses:  Acute cystitis without hematuria    Rx / DC Orders ED Discharge Orders          Ordered    benzonatate (TESSALON) 100 MG capsule  3 times daily PRN        04/28/22 0213    levofloxacin (LEVAQUIN) 500 MG tablet  Daily        04/28/22 0213              Edy Mcbane, Corene Cornea, MD 04/28/22 [phone]

## 2022-04-27 NOTE — ED Provider Triage Note (Signed)
Emergency Medicine Provider Triage Evaluation Note  Gina [name] , a 89 y.o. female  was evaluated in triage.  Pt complains of cough for the past 4 days.  Patient sent over here from urgent care being tachycardic in the 114.  Daughter thinks she may have a UTI as she has had some strong urine.  She has had some repetitive questioning which is apparently similar to her previous UTI.  Patient currently denies any abdominal pain or any dysuria.  She denies any fevers.  She reports that she has had a runny nose with a productive cough for the past 4 days.  No fevers.  Review of Systems  Positive:  Negative:   Physical Exam  BP 120/69 (BP Location: Right Arm)   Pulse 89   Temp 98.4 F (36.9 C) (Oral)   Resp 16   Ht 5\' 3"  (1.6 m)   Wt 68 kg   SpO2 96%   BMI 26.57 kg/m  Gen:   Awake, no distress   Resp:  Normal effort  MSK:   Moves extremities without difficulty  Other:    Medical Decision Making  Medically screening exam initiated at 7:54 PM.  Appropriate orders placed.  Gina [name] was informed that the remainder of the evaluation will be completed by another provider, this initial triage assessment does not replace that evaluation, and the importance of remaining in the ED until their evaluation is complete.  Labs ordered. Patient is not tachycardic here    Sherrell Puller, PA-C 04/27/22 1955

## 2022-04-28 MED ORDER — BENZONATATE 100 MG PO CAPS: 100.0000 mg | ORAL_CAPSULE | Freq: Three times a day (TID) | ORAL | 0 refills | Status: DC | PRN

## 2022-04-28 MED ORDER — LEVOFLOXACIN 500 MG PO TABS: 500.0000 mg | ORAL_TABLET | Freq: Every day | ORAL | 0 refills | Status: DC

## 2022-04-28 MED ORDER — LACTATED RINGERS IV BOLUS
500.0000 mL | Freq: Once | INTRAVENOUS | Status: AC
  Administered 2022-04-28: 500 mL via INTRAVENOUS

## 2022-04-28 NOTE — ED Notes (Signed)
Pt ambulated in the hall with walker with no issues. Pt stated she was a little weaker then normal but had no issues ambulating.

## 2022-04-29 LAB — URINE CULTURE: Culture: >=100000 — AB

## 2022-04-30 ENCOUNTER — Telehealth (HOSPITAL_BASED_OUTPATIENT_CLINIC_OR_DEPARTMENT_OTHER): Payer: Self-pay | Admitting: *Deleted

## 2022-04-30 NOTE — Telephone Encounter (Signed)
Post ED Visit - Positive Culture Follow-up  Culture report reviewed by antimicrobial stewardship pharmacist: [place] Team []  Elenor Quinones, Pharm.D. []  Heide Guile, Pharm.D., BCPS AQ-ID []  Parks Neptune, Pharm.D., BCPS []  Alycia Rossetti, Pharm.D., BCPS []  [place], [place].D., BCPS, AAHIVP []  Legrand Como, Pharm.D., BCPS, AAHIVP []  Salome Arnt, PharmD, BCPS []  Johnnette Gourd, PharmD, BCPS []  Hughes Better, PharmD, BCPS []  Leeroy Cha, PharmD []  Laqueta Linden, PharmD, BCPS [x]  Lorelei Pont, PharmD  [place] Team []  Leodis Sias, PharmD []  Lindell Spar, PharmD []  Royetta Asal, PharmD []  Graylin Shiver, Rph []  Rema Fendt) Glennon Mac, PharmD []  Arlyn Dunning, PharmD []  Netta Cedars, PharmD []  Dia Sitter, PharmD []  Leone Haven, PharmD []  Gretta Arab, PharmD []  Theodis Shove, PharmD []  Peggyann Juba, PharmD []  Reuel Boom, PharmD   Positive urine culture Treated with Levofloxacin, organism sensitive to the same and no further patient follow-up is required at this time.  Rosie Fate 04/30/2022, 1:36 PM

## 2023-10-14 ENCOUNTER — Emergency Department

## 2023-10-14 ENCOUNTER — Emergency Department
Admission: EM | Admit: 2023-10-14 | Discharge: 2023-10-15 | Disposition: A | Discharge status: Home / Self Care | Attending: Emergency Medicine | Admitting: Emergency Medicine

## 2023-10-14 ENCOUNTER — Other Ambulatory Visit: Payer: Self-pay

## 2023-10-14 DIAGNOSIS — W010XXA Fall on same level from slipping, tripping and stumbling without subsequent striking against object, initial encounter: Secondary | ICD-10-CM | POA: Diagnosis not present

## 2023-10-14 DIAGNOSIS — Y92009 Unspecified place in unspecified non-institutional (private) residence as the place of occurrence of the external cause: Secondary | ICD-10-CM | POA: Insufficient documentation

## 2023-10-14 DIAGNOSIS — M25561 Pain in right knee: Secondary | ICD-10-CM | POA: Diagnosis present

## 2023-10-14 DIAGNOSIS — N183 Chronic kidney disease, stage 3 unspecified: Secondary | ICD-10-CM | POA: Diagnosis not present

## 2023-10-14 DIAGNOSIS — I129 Hypertensive chronic kidney disease with stage 1 through stage 4 chronic kidney disease, or unspecified chronic kidney disease: Secondary | ICD-10-CM | POA: Diagnosis not present

## 2023-10-14 DIAGNOSIS — I6789 Other cerebrovascular disease: Secondary | ICD-10-CM | POA: Diagnosis not present

## 2023-10-14 LAB — CBC WITH DIFFERENTIAL/PLATELET
Abs Immature Granulocytes: 0.05 K/uL (ref 0.00–0.07)
Basophils Absolute: 0 K/uL (ref 0.0–0.1)
Basophils Relative: 0 %
Eosinophils Absolute: 0.1 K/uL (ref 0.0–0.5)
Eosinophils Relative: 1 %
HCT: 42.2 % (ref 36.0–46.0)
Hemoglobin: 14.1 g/dL (ref 12.0–15.0)
Immature Granulocytes: 1 %
Lymphocytes Relative: 9 %
Lymphs Abs: 1 K/uL (ref 0.7–4.0)
MCH: 31.8 pg (ref 26.0–34.0)
MCHC: 33.4 g/dL (ref 30.0–36.0)
MCV: 95 fL (ref 80.0–100.0)
Monocytes Absolute: 0.7 K/uL (ref 0.1–1.0)
Monocytes Relative: 7 %
Neutro Abs: 8.5 K/uL — ABNORMAL HIGH (ref 1.7–7.7)
Neutrophils Relative %: 82 %
Platelets: 198 K/uL (ref 150–400)
RBC: 4.44 MIL/uL (ref 3.87–5.11)
RDW: 12.5 % (ref 11.5–15.5)
WBC: 10.4 K/uL (ref 4.0–10.5)
nRBC: 0 % (ref 0.0–0.2)

## 2023-10-14 LAB — COMPREHENSIVE METABOLIC PANEL WITH GFR
ALT: 10 U/L (ref 0–44)
AST: 19 U/L (ref 15–41)
Albumin: 3.8 g/dL (ref 3.5–5.0)
Alkaline Phosphatase: 79 U/L (ref 38–126)
Anion gap: 8 (ref 5–15)
BUN: 24 mg/dL — ABNORMAL HIGH (ref 8–23)
CO2: 20 mmol/L — ABNORMAL LOW (ref 22–32)
Calcium: 9.8 mg/dL (ref 8.9–10.3)
Chloride: 113 mmol/L — ABNORMAL HIGH (ref 98–111)
Creatinine, Ser: 1.15 mg/dL — ABNORMAL HIGH (ref 0.44–1.00)
GFR, Estimated: 46 mL/min — ABNORMAL LOW (ref >60)
Glucose, Bld: 93 mg/dL (ref 70–99)
Potassium: 3.6 mmol/L (ref 3.5–5.1)
Sodium: 141 mmol/L (ref 135–145)
Total Bilirubin: 0.8 mg/dL (ref 0.0–1.2)
Total Protein: 6.9 g/dL (ref 6.5–8.1)

## 2023-10-14 MED ORDER — ACETAMINOPHEN 500 MG PO TABS
1000.0000 mg | ORAL_TABLET | Freq: Once | ORAL | Status: AC
  Administered 2023-10-14: 1000 mg via ORAL
  Filled 2023-10-14: qty 2

## 2023-10-14 NOTE — ED Provider Notes (Signed)
 [facility] Provider Note    Event Date/Time   First MD Initiated Contact with Patient 10/14/23 2307     (approximate)   History   Chief Complaint: Fall   HPI  Gina [name] is a 89 y.o. female with a past history of CKD, GERD, hypertension who was in her usual state of health at home, had a trip and fall while trying to walk to her kitchen.  Did not hit her head, denies loss of consciousness or neck pain.  Does complain of pain in the left knee.        Past Medical History:  Diagnosis Date   CKD (chronic kidney disease) stage 3, GFR 30-59 ml/min (HCC)    GERD (gastroesophageal reflux disease)    Hypertension    Pneumonia     Current Outpatient Rx   Order #: [id number] Class: Normal   Order #: [id number] Class: Historical Med   Order #: [id number] Class: Normal   Order #: [id number] Class: Historical Med   Order #: [id number] Class: Historical Med   Order #: [id number] Class: No Print   Order #: [id number] Class: Normal    Past Surgical History:  Procedure Laterality Date   FOOT SURGERY     Partial removal right great toe   Right knee surgery     SHOULDER SURGERY Right     Physical Exam   Triage Vital Signs: ED Triage Vitals  Encounter Vitals Group     BP 10/14/23 2027 (!) 146/67     Girls Systolic BP Percentile --      Girls Diastolic BP Percentile --      Boys Systolic BP Percentile --      Boys Diastolic BP Percentile --      Pulse Rate 10/14/23 2027 83     Resp 10/14/23 2027 18     Temp 10/14/23 2027 98.2 F (36.8 C)     Temp Source 10/14/23 2027 Oral     SpO2 10/14/23 2027 95 %     Weight --      Height 10/14/23 2025 5' 3 (1.6 m)     Head Circumference --      Peak Flow --      Pain Score 10/14/23 2025 0     Pain Loc --      Pain Education --      Exclude from Growth Chart --     Most recent vital signs: Vitals:   10/15/23 0100 10/15/23 0200  BP: 135/61 135/65  Pulse:    Resp:    Temp:    SpO2:      General: Awake,  no distress.  CV:  Good peripheral perfusion.  Regular rate rhythm Resp:  Normal effort.  Clear to auscultation Abd:  No distention.  Soft nontender Other:  Tenderness at right greater trochanter and left proximal tibia.  Moist oral mucosa.  Head atraumatic.   ED Results / Procedures / Treatments   Labs (all labs ordered are listed, but only abnormal results are displayed) Labs Reviewed  CBC WITH DIFFERENTIAL/PLATELET - Abnormal; Notable for the following components:      Result Value   Neutro Abs 8.5 (*)    All other components within normal limits  COMPREHENSIVE METABOLIC PANEL WITH GFR - Abnormal; Notable for the following components:   Chloride 113 (*)    CO2 20 (*)    BUN 24 (*)    Creatinine, Ser 1.15 (*)    GFR, Estimated 46 (*)    All other  components within normal limits     EKG    RADIOLOGY X-ray right hip interpreted by me, negative for fracture.  Radiology report reviewed  X-ray left knee unremarkable CT head neck unremarkable   PROCEDURES:  Procedures   MEDICATIONS ORDERED IN ED: Medications  acetaminophen  (TYLENOL ) tablet 1,000 mg (1,000 mg Oral Given 10/14/23 2349)     IMPRESSION / MDM / ASSESSMENT AND PLAN / ED COURSE  I reviewed the triage vital signs and the nursing notes.  DDx: Right hip fracture, left knee fracture, anemia, AKI, electrolyte derangement, intracranial hemorrhage  Patient's presentation is most consistent with acute presentation with potential threat to life or bodily function.  Patient comes to the ED with left knee pain and right hip pain after mechanical fall at home.  She is nontoxic with reassuring vitals.  Labs and imaging all unremarkable.  Stable for discharge       FINAL CLINICAL IMPRESSION(S) / ED DIAGNOSES   Final diagnoses:  Fall in home, initial encounter     Rx / DC Orders   ED Discharge Orders     None        Note:  This document was prepared using Dragon voice recognition software and may  include unintentional dictation errors.   Viviann Pastor, MD 10/15/23 [phone]

## 2023-10-14 NOTE — ED Notes (Signed)
 Bed alarm on bed and turned on, fall risk bracelet on pt left wrist, and non slip socks on pt. Donny, RN at bedside.

## 2023-10-14 NOTE — ED Triage Notes (Signed)
 Pt to ed from home via POV for a fall. Pt thinks she tripped and fell. Pt bit her tongue when she fell. Pt denies hitting her head. Pt denies blood thinners. Pt is caox4, in no acute distress in triage.

## 2023-10-14 NOTE — ED Notes (Signed)
 Pt dtr to desk, pt dtr stated that pt stated several times that her throat hurts.

## 2023-10-15 ENCOUNTER — Emergency Department

## 2023-10-15 NOTE — Discharge Instructions (Signed)
 Your CT scans of the head and neck and x-rays were all okay.  Lab tests were also reassuring.  Please continue taking Tylenol  as needed for pain and follow-up with your doctor.

## 2023-10-15 NOTE — ED Notes (Signed)
 Pt ambulated independently with walker in room to the hallway and back, ambulated at baseline per daughter.

## 2023-12-11 NOTE — H&P (Signed)
 TOTAL KNEE ADMISSION H&P  Patient is being admitted for left total knee arthroplasty.  Subjective:  Chief Complaint: Left knee pain.  HPI: Gina [name], 89 y.o. female has a history of pain and functional disability in the left knee due to arthritis and has failed non-surgical conservative treatments for greater than 12 weeks to include NSAID's and/or analgesics, corticosteriod injections, and activity modification. Onset of symptoms was gradual, starting >10 years ago with gradually worsening course since that time. The patient noted no past surgery on the left knee.  Patient currently rates pain in the left knee at 9 out of 10 with activity. Patient has night pain, worsening of pain with activity and weight bearing, pain with passive range of motion, and crepitus. 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 There is no active infection.  Patient Active Problem List   Diagnosis Date Noted   Acute kidney injury superimposed on chronic kidney disease (HCC) 11/21/2021   Left knee pain 11/21/2021   Acute metabolic encephalopathy 11/21/2021   Pleural effusion on left 11/21/2021   Sepsis (HCC) 06/13/2020   CAP (community acquired pneumonia) 06/13/2020   Fever 06/12/2020   SIRS (systemic inflammatory response syndrome) (HCC) 06/12/2020   Fall at home, initial encounter 06/12/2020   Hypotension 06/12/2020   Hypokalemia 06/12/2020   Hyponatremia 06/12/2020   Weakness 06/12/2020    Past Medical History:  Diagnosis Date   CKD (chronic kidney disease) stage 3, GFR 30-59 ml/min (HCC)    GERD (gastroesophageal reflux disease)    Hypertension    Pneumonia     Past Surgical History:  Procedure Laterality Date   FOOT SURGERY     Partial removal right great toe   Right knee surgery     SHOULDER SURGERY Right     Prior to Admission medications   Medication Sig Start Date End Date Taking? Authorizing Provider  benzonatate  (TESSALON ) 100  MG capsule Take 1 capsule (100 mg total) by mouth 3 (three) times daily as needed for cough. 04/28/22   Mesner, Jason, MD  hydrochlorothiazide (HYDRODIURIL) 12.5 MG tablet Take 12.5 mg by mouth every other day. 04/05/20   [provider]  levofloxacin  (LEVAQUIN ) 500 MG tablet Take 1 tablet (500 mg total) by mouth daily. 04/28/22   Mesner, Jason, MD  omeprazole (PRILOSEC) 20 MG capsule Take 20 mg by mouth daily. 04/05/20   [provider]  Polyethyl Glycol-Propyl Glycol (SYSTANE OP) Place 1 drop into both eyes daily.    [provider]  senna-docusate (SENOKOT-S) 8.6-50 MG tablet Take 1 tablet by mouth at bedtime. 11/24/21   Jerri Keys, MD  valsartan  (DIOVAN ) 40 MG tablet Take 1 tablet (40 mg total) by mouth daily. Hold if sbp less than 100 11/24/21 11/24/22  Jerri Keys, MD    Allergies  Allergen Reactions   Codeine     Other reaction(s): Other (See Comments) Insomnia Insomnia    Strawberry Extract Hives    Social History   Socioeconomic History   Marital status: Divorced    Spouse name: Not on file   Number of children: Not on file   Years of education: Not on file   Highest education level: Not on file  Occupational History   Not on file  Tobacco Use   Smoking status: Never   Smokeless tobacco: Never  Substance and Sexual Activity   Alcohol  use: Never   Drug use: Never   Sexual activity: Not on file  Other Topics Concern  Not on file  Social History Narrative   Not on file   Social Drivers of Health   Financial Resource Strain: Low Risk  (07/29/2019)   Received from [facility] prior to 06/10/2022.   Overall Financial Resource Strain (CARDIA)    Difficulty of Paying Living Expenses: Not hard at all  Food Insecurity: Low Risk  (06/19/2022)   Received from [facility]   Hunger Vital Sign    Within the past 12 months, you worried that your food would run out before you got money to buy more: Never true    Within the past  12 months, the food you bought just didn't last and you didn't have money to get more. : Never true  Transportation Needs: No Transportation Needs (06/19/2022)   Received from [facility]    In the past 12 months, has lack of reliable transportation kept you from medical appointments, meetings, work or from getting things needed for daily living? : No  Physical Activity: Sufficiently Active (07/29/2019)   Received from [facility] visits prior to 06/10/2022.   Exercise Vital Sign    On average, how many days per week do you engage in moderate to strenuous exercise (like a brisk walk)?: 7 days    On average, how many minutes do you engage in exercise at this level?: 30 min  Stress: No Stress Concern Present (07/29/2019)   Received from [facility] visits prior to 06/10/2022.   [employer] of Occupational Health - Occupational Stress Questionnaire    Feeling of Stress : Not at all  Social Connections: Socially Isolated (07/29/2019)   Received from [facility] visits prior to 06/10/2022.   Social Connection and Isolation Panel    In a typical week, how many times do you talk on the phone with family, friends, or neighbors?: More than three times a week    How often do you get together with friends or relatives?: Twice a week    How often do you attend church or religious services?: Never    Do you belong to any clubs or organizations such as church groups, unions, fraternal or athletic groups, or school groups?: No    How often do you attend meetings of the clubs or organizations you belong to?: Never    Are you married, widowed, divorced, separated, never married, or living with a partner?: Divorced  Intimate Partner Violence: Not At Risk (07/29/2019)   Received from [facility] visits prior to 06/10/2022.   Humiliation, Afraid, Rape, and Kick questionnaire    Within the last year, have you  been afraid of your partner or ex-partner?: No    Within the last year, have you been humiliated or emotionally abused in other ways by your partner or ex-partner?: No    Within the last year, have you been kicked, hit, slapped, or otherwise physically hurt by your partner or ex-partner?: No    Within the last year, have you been raped or forced to have any kind of sexual activity by your partner or ex-partner?: No    Tobacco Use: Low Risk  (11/06/2023)   Received from [facility]   Patient History    Smoking Tobacco Use: Never    Smokeless Tobacco Use: Never    Passive Exposure: Not on file   Social History   Substance and Sexual Activity  Alcohol  Use Never  Family History  Problem Relation Age of Onset   Heart disease Mother    Cataracts Mother    Lung cancer Father     Review of Systems  Constitutional:  Negative for chills and fever.  HENT:  Negative for congestion, sore throat and tinnitus.   Eyes:  Negative for double vision, photophobia and pain.  Respiratory:  Negative for cough, shortness of breath and wheezing.   Cardiovascular:  Negative for chest pain, palpitations and orthopnea.  Gastrointestinal:  Negative for heartburn, nausea and vomiting.  Genitourinary:  Negative for dysuria, frequency and urgency.  Musculoskeletal:  Positive for joint pain.  Neurological:  Negative for dizziness, weakness and headaches.    Objective:  Physical Exam: Well nourished and well developed.  General: Alert and oriented x3, cooperative and pleasant, no acute distress.  Head: normocephalic, atraumatic, neck supple.  Eyes: EOMI.  Musculoskeletal:  Evaluation of the left knee demonstrates no effusion. Range of motion is 0-105 degrees with marked crepitus. The knee shifts during bending. Tenderness is noted medially and laterally. No instability is observed.  Calves soft and nontender. Motor function intact in LE. Strength 5/5 LE bilaterally. Neuro: Distal pulses 2+.  Sensation to light touch intact in LE.  Imaging Review Plain radiographs demonstrate severe degenerative joint disease of the left knee. The overall alignment is neutral. The bone quality appears to be adequate for age and reported activity level.  Assessment/Plan:  End stage arthritis, left knee   The patient history, physical examination, clinical judgment of the provider and imaging studies are consistent with end stage degenerative joint disease of the left knee and total knee arthroplasty is deemed medically necessary. The treatment options including medical management, injection therapy arthroscopy and arthroplasty were discussed at length. The risks and benefits of total knee arthroplasty were presented and reviewed. The risks due to aseptic loosening, infection, stiffness, patella tracking problems, thromboembolic complications and other imponderables were discussed. The patient acknowledged the explanation, agreed to proceed with the plan and consent was signed. Patient is being admitted for inpatient treatment for surgery, pain control, PT, OT, prophylactic antibiotics, VTE prophylaxis, progressive ambulation and ADLs and discharge planning. The patient is planning to be discharged home.   Patient's anticipated LOS is less than 2 midnights, meeting these requirements: - Lives within 1 hour of care - Has a competent adult at home to recover with post-op recover - NO history of  - Chronic pain requiring opioids  - Diabetes  - Coronary Artery Disease  - Heart failure  - Heart attack  - Stroke  - DVT/VTE  - Cardiac arrhythmia  - Respiratory Failure/COPD  - Renal failure  - Anemia  - Advanced Liver disease  Therapy Plans: Outpatient therapy at [facility]) Disposition: Home with daughter [name] Planned DVT Prophylaxis: Aspirin 81 mg BID DME Needed: None PCP: Thersia Pizza, MD (clearance received) TXA: IV Allergies: Levofloxacin  (myalgias), codeine Metal Allergy:  None Anesthesia Concerns: None BMI: 29 Last HgbA1c: Not diabetic Pain Regimen: Dilaudid (low dose), tramadol [facility]acy: [facility]  Other: - Took dilaudid after right TKA in 2010  - Patient was instructed on what medications to stop prior to surgery. - Follow-up visit in 2 weeks with Dr. Melodi - Begin physical therapy following surgery - Pre-operative lab work as pre-surgical testing - Prescriptions will be provided in hospital at time of discharge  Roxie Mess, PA-C Orthopedic Surgery [facility] [place]

## 2023-12-18 NOTE — Progress Notes (Signed)
 COVID Vaccine received:  [x]  No []  Yes Date of any COVID positive Test in last 90 days: NO PCP - Dr. Thersia Pizza @ [facility] Cardiologist - N/A  Chest x-ray -  EKG -   Stress Test -  ECHO -  Cardiac Cath -   Surgical Clearance Dr. Thersia Pizza  11/23/23  Bowel Prep - [x]  No  []   Yes ______  Pacemaker / ICD device [x]  No []  Yes   Spinal Cord Stimulator:[x]  No []  Yes       History of Sleep Apnea? [x]  No []  Yes   CPAP used?- [x]  No []  Yes    Does the patient monitor blood sugar?          [x]  No []  Yes  []  N/A  Patient has: [x]  NO Hx DM   []  Pre-DM                 []  DM1  []   DM2 Does patient have a [employer] or Dexacom? []  No []  Yes   Fasting Blood Sugar Ranges-  Checks Blood Sugar _____ times a day  GLP1 agonist / usual dose - NO GLP1 instructions:  SGLT-2 inhibitors / usual dose - NO SGLT-2 instructions:   Blood Thinner / Instructions:no Aspirin Instructions:no  Comments:   Activity level: Patient is  unable to climb a flight of stairs without difficulty; [x]  No CP  [x]  No SOB, but would have _knee dysfunction__   Patient can perform ADLs without assistance.   Anesthesia review:   Patient denies shortness of breath, fever, cough and chest pain at PAT appointment.  Patient verbalized understanding and agreement to the Pre-Surgical Instructions that were given to them at this PAT appointment. Patient was also educated of the need to review these PAT instructions again prior to his/her surgery.I reviewed the appropriate phone numbers to call if they have any and questions or concerns.

## 2023-12-18 NOTE — Patient Instructions (Signed)
 SURGICAL WAITING ROOM VISITATION  Patients having surgery or a procedure may have no more than 2 support people in the waiting area - these visitors may rotate.    Children under the age of 58 must have an adult with them who is not the patient.  Visitors with respiratory illnesses are discouraged from visiting and should remain at home.  If the patient needs to stay at the hospital during part of their recovery, the visitor guidelines for inpatient rooms apply. Pre-op nurse will coordinate an appropriate time for 1 support person to accompany patient in pre-op.  This support person may not rotate.    Please refer to the [facility] website for the visitor guidelines for Inpatients (after your surgery is over and you are in a regular room).       Your procedure is scheduled on: 12/31/23   Report to [facility] Main Entrance    Report to admitting at 9 AM   Call this number if you have problems the morning of surgery [phone]   Do not eat food :After Midnight.   After Midnight you may have the following liquids until 8:30 AM DAY OF SURGERY  Water Non-Citrus Juices (without pulp, NO RED-Apple, White grape, White cranberry) Black Coffee (NO MILK/CREAM OR CREAMERS, sugar ok)  Clear Tea (NO MILK/CREAM OR CREAMERS, sugar ok) regular and decaf                             Plain Jell-O (NO RED)                                           Fruit ices (not with fruit pulp, NO RED)                                     Popsicles (NO RED)                                                               Sports drinks like Gatorade (NO RED)                  The day of surgery:  Drink ONE (1) Pre-Surgery Clear Ensure at 8:30 AM the morning of surgery. Drink in one sitting. Do not sip.  This drink was given to you during your hospital  pre-op appointment visit. Nothing else to drink after completing the  Pre-Surgery Clear Ensure.     Oral Hygiene is also important to reduce your risk  of infection.                                    Remember - BRUSH YOUR TEETH THE MORNING OF SURGERY WITH YOUR REGULAR TOOTHPASTE  DENTURES WILL BE REMOVED PRIOR TO SURGERY PLEASE DO NOT APPLY Poly grip OR ADHESIVES!!!   Stop all vitamins and herbal supplements 7 days before surgery.   Take these medicines the morning of surgery with A SIP OF WATER: Omeprazole  You may not have any metal on your body including hair pins, jewelry, and body piercing             Do not wear make-up, lotions, powders, perfumes/cologne, or deodorant  Do not wear nail polish including gel and S&S, artificial/acrylic nails, or any other type of covering on natural nails including finger and toenails. If you have artificial nails, gel coating, etc. that needs to be removed by a nail salon please have this removed prior to surgery or surgery may need to be canceled/ delayed if the surgeon/ anesthesia feels like they are unable to be safely monitored.   Do not shave  48 hours prior to surgery.    Do not bring valuables to the hospital. [place] IS NOT             RESPONSIBLE   FOR VALUABLES.   Contacts, glasses, dentures or bridgework may not be worn into surgery.   Bring small overnight bag day of surgery.   DO NOT BRING YOUR HOME MEDICATIONS TO THE HOSPITAL. PHARMACY WILL DISPENSE MEDICATIONS LISTED ON YOUR MEDICATION LIST TO YOU DURING YOUR ADMISSION IN THE HOSPITAL!    Patients discharged on the day of surgery will not be allowed to drive home.  Someone NEEDS to stay with you for the first 24 hours after anesthesia.   Special Instructions: Bring a copy of your healthcare power of attorney and living will documents the day of surgery if you haven't scanned them before.              Please read over the following fact sheets you were given: IF YOU HAVE QUESTIONS ABOUT YOUR PRE-OP INSTRUCTIONS PLEASE CALL [phone] Gina [name]   If you received a COVID test during your pre-op visit  it is requested  that you wear a mask when out in public, stay away from anyone that may not be feeling well and notify your surgeon if you develop symptoms. If you test positive for Covid or have been in contact with anyone that has tested positive in the last 10 days please notify you surgeon.      Pre-operative 5 CHG Bath Instructions   You can play a key role in reducing the risk of infection after surgery. Your skin needs to be as free of germs as possible. You can reduce the number of germs on your skin by washing with CHG (chlorhexidine gluconate) soap before surgery. CHG is an antiseptic soap that kills germs and continues to kill germs even after washing.   DO NOT use if you have an allergy to chlorhexidine/CHG or antibacterial soaps. If your skin becomes reddened or irritated, stop using the CHG and notify one of our RNs at [phone].   Please shower with the CHG soap starting 4 days before surgery using the following schedule:     Please keep in mind the following:  DO NOT shave, including legs and underarms, starting the day of your first shower.   You may shave your face at any point before/day of surgery.  Place clean sheets on your bed the day you start using CHG soap. Use a clean washcloth (not used since being washed) for each shower. DO NOT sleep with pets once you start using the CHG.   CHG Shower Instructions:  If you choose to wash your hair and private area, wash first with your normal shampoo/soap.  After you use shampoo/soap, rinse your hair and body thoroughly to remove shampoo/soap residue.  Turn the  water OFF and apply about 3 tablespoons (45 ml) of CHG soap to a CLEAN washcloth.  Apply CHG soap ONLY FROM YOUR NECK DOWN TO YOUR TOES (washing for 3-5 minutes)  DO NOT use CHG soap on face, private areas, open wounds, or sores.  Pay special attention to the area where your surgery is being performed.  If you are having back surgery, having someone wash your back for you may be  helpful. Wait 2 minutes after CHG soap is applied, then you may rinse off the CHG soap.  Pat dry with a clean towel  Put on clean clothes/pajamas   If you choose to wear lotion, please use ONLY the CHG-compatible lotions on the back of this paper.     Additional instructions for the day of surgery: DO NOT APPLY any lotions, deodorants, cologne, or perfumes.   Put on clean/comfortable clothes.  Brush your teeth.  Ask your nurse before applying any prescription medications to the skin.      CHG Compatible Lotions   Aveeno Moisturizing lotion  Cetaphil Moisturizing Cream  Cetaphil Moisturizing Lotion  Clairol Herbal Essence Moisturizing Lotion, Dry Skin  Clairol Herbal Essence Moisturizing Lotion, Extra Dry Skin  Clairol Herbal Essence Moisturizing Lotion, Normal Skin  Curel Age Defying Therapeutic Moisturizing Lotion with Alpha Hydroxy  Curel Extreme Care Body Lotion  Curel Soothing Hands Moisturizing Hand Lotion  Curel Therapeutic Moisturizing Cream, Fragrance-Free  Curel Therapeutic Moisturizing Lotion, Fragrance-Free  Curel Therapeutic Moisturizing Lotion, Original Formula  Eucerin Daily Replenishing Lotion  Eucerin Dry Skin Therapy Plus Alpha Hydroxy Crme  Eucerin Dry Skin Therapy Plus Alpha Hydroxy Lotion  Eucerin Original Crme  Eucerin Original Lotion  Eucerin Plus Crme Eucerin Plus Lotion  Eucerin TriLipid Replenishing Lotion  Keri Anti-Bacterial Hand Lotion  Keri Deep Conditioning Original Lotion Dry Skin Formula Softly Scented  Keri Deep Conditioning Original Lotion, Fragrance Free Sensitive Skin Formula  Keri Lotion Fast Absorbing Fragrance Free Sensitive Skin Formula  Keri Lotion Fast Absorbing Softly Scented Dry Skin Formula  Keri Original Lotion  Keri Skin Renewal Lotion Keri Silky Smooth Lotion  Keri Silky Smooth Sensitive Skin Lotion  Nivea Body Creamy Conditioning Oil  Nivea Body Extra Enriched Lotion  Nivea Body Original Lotion  Nivea Body Sheer  Moisturizing Lotion Nivea Crme  Nivea Skin Firming Lotion  NutraDerm 30 Skin Lotion  NutraDerm Skin Lotion  NutraDerm Therapeutic Skin Cream  NutraDerm Therapeutic Skin Lotion  ProShield Protective Hand Cream   Incentive Spirometer  An incentive spirometer is a tool that can help keep your lungs clear and active. This tool measures how well you are filling your lungs with each breath. Taking long deep breaths may help reverse or decrease the chance of developing breathing (pulmonary) problems (especially infection) following: A long period of time when you are unable to move or be active. BEFORE THE PROCEDURE  If the spirometer includes an indicator to show your best effort, your nurse or respiratory therapist will set it to a desired goal. If possible, sit up straight or lean slightly forward. Try not to slouch. Hold the incentive spirometer in an upright position. INSTRUCTIONS FOR USE  Sit on the edge of your bed if possible, or sit up as far as you can in bed or on a chair. Hold the incentive spirometer in an upright position. Breathe out normally. Place the mouthpiece in your mouth and seal your lips tightly around it. Breathe in slowly and as deeply as possible, raising the piston or the ball toward  the top of the column. Hold your breath for 3-5 seconds or for as long as possible. Allow the piston or ball to fall to the bottom of the column. Remove the mouthpiece from your mouth and breathe out normally. Rest for a few seconds and repeat Steps 1 through 7 at least 10 times every 1-2 hours when you are awake. Take your time and take a few normal breaths between deep breaths. The spirometer may include an indicator to show your best effort. Use the indicator as a goal to work toward during each repetition. After each set of 10 deep breaths, practice coughing to be sure your lungs are clear. If you have an incision (the cut made at the time of surgery), support your incision when  coughing by placing a pillow or rolled up towels firmly against it. Once you are able to get out of bed, walk around indoors and cough well. You may stop using the incentive spirometer when instructed by your caregiver.  RISKS AND COMPLICATIONS Take your time so you do not get dizzy or light-headed. If you are in pain, you may need to take or ask for pain medication before doing incentive spirometry. It is harder to take a deep breath if you are having pain. AFTER USE Rest and breathe slowly and easily. It can be helpful to keep track of a log of your progress. Your caregiver can provide you with a simple table to help with this. If you are using the spirometer at home, follow these instructions: SEEK MEDICAL CARE IF:  You are having difficultly using the spirometer. You have trouble using the spirometer as often as instructed. Your pain medication is not giving enough relief while using the spirometer. You develop fever of 100.5 F (38.1 C) or higher. SEEK IMMEDIATE MEDICAL CARE IF:  You cough up bloody sputum that had not been present before. You develop fever of 102 F (38.9 C) or greater. You develop worsening pain at or near the incision site. MAKE SURE YOU:  Understand these instructions. Will watch your condition. Will get help right away if you are not doing well or get worse. Document Released: 08/07/2006 Document Revised: 06/19/2011 Document Reviewed: 10/08/2006 [facility] Patient Information 2014 [place], [place].

## 2023-12-19 ENCOUNTER — Other Ambulatory Visit: Payer: Self-pay

## 2023-12-19 ENCOUNTER — Encounter (HOSPITAL_COMMUNITY)
Admission: RE | Admit: 2023-12-19 | Discharge: 2023-12-19 | Disposition: A | Discharge status: Home / Self Care | Source: Ambulatory Visit | Attending: Orthopedic Surgery | Admitting: Orthopedic Surgery

## 2023-12-19 ENCOUNTER — Encounter (HOSPITAL_COMMUNITY): Payer: Self-pay

## 2023-12-19 VITALS — BP 127/62 | HR 68 | Temp 97.5°F | Resp 18 | Ht 63.0 in | Wt 158.0 lb

## 2023-12-19 DIAGNOSIS — I10 Essential (primary) hypertension: Secondary | ICD-10-CM | POA: Diagnosis not present

## 2023-12-19 DIAGNOSIS — Z01818 Encounter for other preprocedural examination: Secondary | ICD-10-CM | POA: Insufficient documentation

## 2023-12-19 DIAGNOSIS — R9431 Abnormal electrocardiogram [ECG] [EKG]: Secondary | ICD-10-CM | POA: Insufficient documentation

## 2023-12-19 LAB — BASIC METABOLIC PANEL WITH GFR
Anion gap: 12 (ref 5–15)
BUN: 25 mg/dL — ABNORMAL HIGH (ref 8–23)
CO2: 21 mmol/L — ABNORMAL LOW (ref 22–32)
Calcium: 10.3 mg/dL (ref 8.9–10.3)
Chloride: 107 mmol/L (ref 98–111)
Creatinine, Ser: 0.95 mg/dL (ref 0.44–1.00)
GFR, Estimated: 57 mL/min — ABNORMAL LOW (ref >60)
Glucose, Bld: 103 mg/dL — ABNORMAL HIGH (ref 70–99)
Potassium: 4.3 mmol/L (ref 3.5–5.1)
Sodium: 140 mmol/L (ref 135–145)

## 2023-12-19 LAB — CBC
HCT: 45.6 % (ref 36.0–46.0)
Hemoglobin: 15.1 g/dL — ABNORMAL HIGH (ref 12.0–15.0)
MCH: 32.3 pg (ref 26.0–34.0)
MCHC: 33.1 g/dL (ref 30.0–36.0)
MCV: 97.4 fL (ref 80.0–100.0)
Platelets: 232 K/uL (ref 150–400)
RBC: 4.68 MIL/uL (ref 3.87–5.11)
RDW: 13.1 % (ref 11.5–15.5)
WBC: 9.7 K/uL (ref 4.0–10.5)
nRBC: 0 % (ref 0.0–0.2)

## 2023-12-19 LAB — SURGICAL PCR SCREEN
MRSA, PCR: NEGATIVE
Staphylococcus aureus: NEGATIVE

## 2023-12-28 NOTE — Progress Notes (Addendum)
 Called again and left message for patient and daughter about time change for surgery on Monday 12/31/23.  Informed via message team will be available till 6;30pm fir call back

## 2023-12-31 ENCOUNTER — Encounter (HOSPITAL_COMMUNITY): Payer: Self-pay | Admitting: Orthopedic Surgery

## 2023-12-31 ENCOUNTER — Ambulatory Visit (HOSPITAL_BASED_OUTPATIENT_CLINIC_OR_DEPARTMENT_OTHER): Admitting: Anesthesiology

## 2023-12-31 ENCOUNTER — Ambulatory Visit (HOSPITAL_COMMUNITY): Admitting: Anesthesiology

## 2023-12-31 ENCOUNTER — Encounter (HOSPITAL_COMMUNITY)
Admission: RE | Disposition: A | Discharge status: Home / Self Care | Payer: Self-pay | Source: Home / Self Care | Attending: Orthopedic Surgery

## 2023-12-31 ENCOUNTER — Other Ambulatory Visit: Payer: Self-pay

## 2023-12-31 ENCOUNTER — Inpatient Hospital Stay (HOSPITAL_COMMUNITY)
Admission: RE | Admit: 2023-12-31 | Discharge: 2024-01-08 | DRG: 470 | Disposition: A | Discharge status: Home / Self Care | Attending: Orthopedic Surgery | Admitting: Orthopedic Surgery

## 2023-12-31 DIAGNOSIS — Z9181 History of falling: Secondary | ICD-10-CM

## 2023-12-31 DIAGNOSIS — N189 Chronic kidney disease, unspecified: Secondary | ICD-10-CM

## 2023-12-31 DIAGNOSIS — M1712 Unilateral primary osteoarthritis, left knee: Secondary | ICD-10-CM | POA: Diagnosis not present

## 2023-12-31 DIAGNOSIS — Z9889 Other specified postprocedural states: Secondary | ICD-10-CM

## 2023-12-31 DIAGNOSIS — N183 Chronic kidney disease, stage 3 unspecified: Secondary | ICD-10-CM | POA: Diagnosis present

## 2023-12-31 DIAGNOSIS — I129 Hypertensive chronic kidney disease with stage 1 through stage 4 chronic kidney disease, or unspecified chronic kidney disease: Secondary | ICD-10-CM | POA: Diagnosis present

## 2023-12-31 DIAGNOSIS — Z91018 Allergy to other foods: Secondary | ICD-10-CM

## 2023-12-31 DIAGNOSIS — M25762 Osteophyte, left knee: Secondary | ICD-10-CM | POA: Diagnosis present

## 2023-12-31 DIAGNOSIS — Z89411 Acquired absence of right great toe: Secondary | ICD-10-CM

## 2023-12-31 DIAGNOSIS — Z87440 Personal history of urinary (tract) infections: Secondary | ICD-10-CM

## 2023-12-31 DIAGNOSIS — Z79899 Other long term (current) drug therapy: Secondary | ICD-10-CM

## 2023-12-31 DIAGNOSIS — Z8701 Personal history of pneumonia (recurrent): Secondary | ICD-10-CM

## 2023-12-31 DIAGNOSIS — Z8619 Personal history of other infectious and parasitic diseases: Secondary | ICD-10-CM

## 2023-12-31 DIAGNOSIS — M179 Osteoarthritis of knee, unspecified: Principal | ICD-10-CM | POA: Diagnosis present

## 2023-12-31 DIAGNOSIS — Z83518 Family history of other specified eye disorder: Secondary | ICD-10-CM

## 2023-12-31 DIAGNOSIS — Z801 Family history of malignant neoplasm of trachea, bronchus and lung: Secondary | ICD-10-CM

## 2023-12-31 DIAGNOSIS — Z885 Allergy status to narcotic agent status: Secondary | ICD-10-CM

## 2023-12-31 DIAGNOSIS — Z8249 Family history of ischemic heart disease and other diseases of the circulatory system: Secondary | ICD-10-CM

## 2023-12-31 DIAGNOSIS — K219 Gastro-esophageal reflux disease without esophagitis: Secondary | ICD-10-CM | POA: Diagnosis present

## 2023-12-31 HISTORY — PX: TOTAL KNEE ARTHROPLASTY: SHX125

## 2023-12-31 SURGERY — ARTHROPLASTY, KNEE, TOTAL
Anesthesia: Monitor Anesthesia Care | Site: Knee | Laterality: Left

## 2023-12-31 MED ORDER — ACETAMINOPHEN 325 MG PO TABS
325.0000 mg | ORAL_TABLET | Freq: Four times a day (QID) | ORAL | Status: DC | PRN
  Administered 2024-01-02 – 2024-01-07 (×10): 650 mg via ORAL
  Filled 2023-12-31 (×10): qty 2

## 2023-12-31 MED ORDER — HYDROMORPHONE HCL 2 MG PO TABS: 1.0000 mg | ORAL_TABLET | ORAL | Status: DC | PRN

## 2023-12-31 MED ORDER — MENTHOL 3 MG MT LOZG: 1.0000 | LOZENGE | OROMUCOSAL | Status: DC | PRN

## 2023-12-31 MED ORDER — PHENYLEPHRINE HCL-NACL 20-0.9 MG/250ML-% IV SOLN
INTRAVENOUS | Status: DC | PRN
Start: 2023-12-31 — End: 2023-12-31
  Administered 2023-12-31: 25 ug/min via INTRAVENOUS

## 2023-12-31 MED ORDER — BUPIVACAINE LIPOSOME 1.3 % IJ SUSP
INTRAMUSCULAR | Status: AC
Start: 2023-12-31 — End: 2023-12-31
  Filled 2023-12-31: qty 20

## 2023-12-31 MED ORDER — SODIUM CHLORIDE 0.9 % IR SOLN
Status: DC | PRN
  Administered 2023-12-31: 1000 mL

## 2023-12-31 MED ORDER — ACETAMINOPHEN 10 MG/ML IV SOLN
1000.0000 mg | Freq: Four times a day (QID) | INTRAVENOUS | Status: DC
  Administered 2023-12-31: 1000 mg via INTRAVENOUS
  Filled 2023-12-31: qty 100

## 2023-12-31 MED ORDER — PROPOFOL 10 MG/ML IV BOLUS
INTRAVENOUS | Status: DC | PRN
Start: 2023-12-31 — End: 2023-12-31
  Administered 2023-12-31: 20 mg via INTRAVENOUS

## 2023-12-31 MED ORDER — PHENOL 1.4 % MT LIQD: 1.0000 | OROMUCOSAL | Status: DC | PRN

## 2023-12-31 MED ORDER — METHOCARBAMOL 1000 MG/10ML IJ SOLN: 500.0000 mg | Freq: Four times a day (QID) | INTRAMUSCULAR | Status: DC | PRN

## 2023-12-31 MED ORDER — FENTANYL CITRATE PF 50 MCG/ML IJ SOSY
PREFILLED_SYRINGE | INTRAMUSCULAR | Status: AC
  Filled 2023-12-31: qty 1

## 2023-12-31 MED ORDER — FENTANYL CITRATE PF 50 MCG/ML IJ SOSY
50.0000 ug | PREFILLED_SYRINGE | INTRAMUSCULAR | Status: DC
  Administered 2023-12-31: 50 ug via INTRAVENOUS

## 2023-12-31 MED ORDER — DIPHENHYDRAMINE HCL 12.5 MG/5ML PO ELIX: 12.5000 mg | ORAL_SOLUTION | ORAL | Status: DC | PRN

## 2023-12-31 MED ORDER — POVIDONE-IODINE 10 % EX SWAB
2.0000 | Freq: Once | CUTANEOUS | Status: AC
  Administered 2023-12-31: 2 via TOPICAL

## 2023-12-31 MED ORDER — OXYCODONE HCL 5 MG PO TABS: 5.0000 mg | ORAL_TABLET | Freq: Once | ORAL | Status: DC | PRN

## 2023-12-31 MED ORDER — ACETAMINOPHEN 500 MG PO TABS
1000.0000 mg | ORAL_TABLET | Freq: Four times a day (QID) | ORAL | Status: AC
  Administered 2023-12-31 – 2024-01-01 (×3): 1000 mg via ORAL
  Filled 2023-12-31 (×3): qty 2

## 2023-12-31 MED ORDER — ACETAMINOPHEN 500 MG PO TABS: 1000.0000 mg | ORAL_TABLET | Freq: Once | ORAL | Status: DC

## 2023-12-31 MED ORDER — DEXAMETHASONE SODIUM PHOSPHATE 10 MG/ML IJ SOLN
INTRAMUSCULAR | Status: AC
  Filled 2023-12-31: qty 1

## 2023-12-31 MED ORDER — ONDANSETRON HCL 4 MG PO TABS: 4.0000 mg | ORAL_TABLET | Freq: Four times a day (QID) | ORAL | Status: DC | PRN

## 2023-12-31 MED ORDER — DEXAMETHASONE SODIUM PHOSPHATE 10 MG/ML IJ SOLN
INTRAMUSCULAR | Status: DC | PRN
Start: 2023-12-31 — End: 2023-12-31
  Administered 2023-12-31: 10 mg via PERINEURAL

## 2023-12-31 MED ORDER — FLEET ENEMA RE ENEM: 1.0000 | ENEMA | Freq: Once | RECTAL | Status: DC | PRN

## 2023-12-31 MED ORDER — SODIUM CHLORIDE (PF) 0.9 % IJ SOLN
INTRAMUSCULAR | Status: AC
  Filled 2023-12-31: qty 10

## 2023-12-31 MED ORDER — PROPOFOL 500 MG/50ML IV EMUL
INTRAVENOUS | Status: DC | PRN
  Administered 2023-12-31: 40 ug/kg/min via INTRAVENOUS

## 2023-12-31 MED ORDER — ROPIVACAINE HCL 5 MG/ML IJ SOLN
INTRAMUSCULAR | Status: DC | PRN
Start: 2023-12-31 — End: 2023-12-31
  Administered 2023-12-31: 30 mL via PERINEURAL

## 2023-12-31 MED ORDER — SODIUM CHLORIDE (PF) 0.9 % IJ SOLN
INTRAMUSCULAR | Status: AC
Start: 2023-12-31 — End: 2023-12-31
  Filled 2023-12-31: qty 50

## 2023-12-31 MED ORDER — EPHEDRINE SULFATE-NACL 50-0.9 MG/10ML-% IV SOSY
PREFILLED_SYRINGE | INTRAVENOUS | Status: DC | PRN
  Administered 2023-12-31: 5 mg via INTRAVENOUS
  Administered 2023-12-31: 10 mg via INTRAVENOUS
  Administered 2023-12-31: 5 mg via INTRAVENOUS

## 2023-12-31 MED ORDER — TRANEXAMIC ACID-NACL 1000-0.7 MG/100ML-% IV SOLN
1000.0000 mg | INTRAVENOUS | Status: AC
  Administered 2023-12-31: 1000 mg via INTRAVENOUS

## 2023-12-31 MED ORDER — ASPIRIN 81 MG PO CHEW
81.0000 mg | CHEWABLE_TABLET | Freq: Two times a day (BID) | ORAL | Status: DC
  Administered 2024-01-01 – 2024-01-08 (×15): 81 mg via ORAL
  Filled 2023-12-31 (×16): qty 1

## 2023-12-31 MED ORDER — PROPOFOL 1000 MG/100ML IV EMUL
INTRAVENOUS | Status: AC
Start: 2023-12-31 — End: 2023-12-31
  Filled 2023-12-31: qty 100

## 2023-12-31 MED ORDER — POLYETHYLENE GLYCOL 3350 17 G PO PACK
17.0000 g | PACK | Freq: Every day | ORAL | Status: DC | PRN
  Administered 2024-01-03: 17 g via ORAL
  Filled 2023-12-31: qty 1

## 2023-12-31 MED ORDER — SODIUM CHLORIDE (PF) 0.9 % IJ SOLN
INTRAMUSCULAR | Status: AC
  Filled 2023-12-31: qty 50

## 2023-12-31 MED ORDER — DEXAMETHASONE SODIUM PHOSPHATE 10 MG/ML IJ SOLN
10.0000 mg | Freq: Once | INTRAMUSCULAR | Status: AC
Start: 2024-01-01 — End: 2024-01-01
  Administered 2024-01-01: 10 mg via INTRAVENOUS
  Filled 2023-12-31: qty 1

## 2023-12-31 MED ORDER — HYDROMORPHONE HCL 1 MG/ML IJ SOLN: 0.2500 mg | INTRAMUSCULAR | Status: DC | PRN

## 2023-12-31 MED ORDER — TRANEXAMIC ACID-NACL 1000-0.7 MG/100ML-% IV SOLN
INTRAVENOUS | Status: AC
  Filled 2023-12-31: qty 100

## 2023-12-31 MED ORDER — BISACODYL 10 MG RE SUPP: 10.0000 mg | Freq: Every day | RECTAL | Status: DC | PRN

## 2023-12-31 MED ORDER — TRAMADOL HCL 50 MG PO TABS
50.0000 mg | ORAL_TABLET | Freq: Four times a day (QID) | ORAL | Status: DC | PRN
  Administered 2023-12-31 – 2024-01-08 (×14): 50 mg via ORAL
  Filled 2023-12-31 (×14): qty 1

## 2023-12-31 MED ORDER — 0.9 % SODIUM CHLORIDE (POUR BTL) OPTIME
TOPICAL | Status: DC | PRN
  Administered 2023-12-31: 1000 mL

## 2023-12-31 MED ORDER — METOCLOPRAMIDE HCL 5 MG/ML IJ SOLN: 5.0000 mg | Freq: Three times a day (TID) | INTRAMUSCULAR | Status: DC | PRN

## 2023-12-31 MED ORDER — ONDANSETRON HCL 4 MG/2ML IJ SOLN: 4.0000 mg | Freq: Four times a day (QID) | INTRAMUSCULAR | Status: DC | PRN

## 2023-12-31 MED ORDER — CEFAZOLIN SODIUM-DEXTROSE 2-4 GM/100ML-% IV SOLN
INTRAVENOUS | Status: AC
  Filled 2023-12-31: qty 100

## 2023-12-31 MED ORDER — ONDANSETRON HCL 4 MG/2ML IJ SOLN
INTRAMUSCULAR | Status: DC | PRN
Start: 2023-12-31 — End: 2023-12-31
  Administered 2023-12-31: 4 mg via INTRAVENOUS

## 2023-12-31 MED ORDER — ORAL CARE MOUTH RINSE: 15.0000 mL | Freq: Once | OROMUCOSAL | Status: AC

## 2023-12-31 MED ORDER — FENTANYL CITRATE PF 50 MCG/ML IJ SOSY: 50.0000 ug | PREFILLED_SYRINGE | INTRAMUSCULAR | Status: DC

## 2023-12-31 MED ORDER — CHLORHEXIDINE GLUCONATE 0.12 % MT SOLN
15.0000 mL | Freq: Once | OROMUCOSAL | Status: AC
  Administered 2023-12-31: 15 mL via OROMUCOSAL

## 2023-12-31 MED ORDER — AMISULPRIDE (ANTIEMETIC) 5 MG/2ML IV SOLN: 10.0000 mg | Freq: Once | INTRAVENOUS | Status: DC | PRN

## 2023-12-31 MED ORDER — SODIUM CHLORIDE (PF) 0.9 % IJ SOLN
INTRAMUSCULAR | Status: AC
Start: 2023-12-31 — End: 2023-12-31
  Filled 2023-12-31: qty 10

## 2023-12-31 MED ORDER — ONDANSETRON HCL 4 MG/2ML IJ SOLN: 4.0000 mg | Freq: Once | INTRAMUSCULAR | Status: DC | PRN

## 2023-12-31 MED ORDER — FENTANYL CITRATE (PF) 100 MCG/2ML IJ SOLN
INTRAMUSCULAR | Status: DC | PRN
  Administered 2023-12-31: 50 ug via INTRAVENOUS

## 2023-12-31 MED ORDER — OXYCODONE HCL 5 MG/5ML PO SOLN: 5.0000 mg | Freq: Once | ORAL | Status: DC | PRN

## 2023-12-31 MED ORDER — BUPIVACAINE IN DEXTROSE 0.75-8.25 % IT SOLN
INTRATHECAL | Status: DC | PRN
  Administered 2023-12-31: 1.6 mL via INTRATHECAL

## 2023-12-31 MED ORDER — IRBESARTAN 75 MG PO TABS
37.5000 mg | ORAL_TABLET | Freq: Every day | ORAL | Status: DC
  Administered 2024-01-01 – 2024-01-08 (×8): 37.5 mg via ORAL
  Filled 2023-12-31 (×8): qty 1

## 2023-12-31 MED ORDER — EPHEDRINE 5 MG/ML INJ
INTRAVENOUS | Status: AC
  Filled 2023-12-31: qty 5

## 2023-12-31 MED ORDER — SODIUM CHLORIDE 0.9 % IV SOLN: INTRAVENOUS | Status: DC

## 2023-12-31 MED ORDER — METOCLOPRAMIDE HCL 5 MG PO TABS: 5.0000 mg | ORAL_TABLET | Freq: Three times a day (TID) | ORAL | Status: DC | PRN

## 2023-12-31 MED ORDER — SODIUM CHLORIDE 0.9 % IV SOLN
INTRAVENOUS | Status: DC | PRN
  Administered 2023-12-31: 80 mL

## 2023-12-31 MED ORDER — CEFAZOLIN SODIUM-DEXTROSE 2-4 GM/100ML-% IV SOLN
2.0000 g | INTRAVENOUS | Status: AC
  Administered 2023-12-31: 2 g via INTRAVENOUS

## 2023-12-31 MED ORDER — DEXAMETHASONE SODIUM PHOSPHATE 10 MG/ML IJ SOLN
8.0000 mg | Freq: Once | INTRAMUSCULAR | Status: AC
  Administered 2023-12-31: 4 mg via INTRAVENOUS

## 2023-12-31 MED ORDER — HYDROMORPHONE HCL 1 MG/ML IJ SOLN: 0.5000 mg | INTRAMUSCULAR | Status: DC | PRN

## 2023-12-31 MED ORDER — CEFAZOLIN SODIUM-DEXTROSE 2-4 GM/100ML-% IV SOLN
2.0000 g | Freq: Four times a day (QID) | INTRAVENOUS | Status: AC
  Administered 2023-12-31 (×2): 2 g via INTRAVENOUS
  Filled 2023-12-31 (×2): qty 100

## 2023-12-31 MED ORDER — ONDANSETRON HCL 4 MG/2ML IJ SOLN
INTRAMUSCULAR | Status: AC
  Filled 2023-12-31: qty 2

## 2023-12-31 MED ORDER — PROPOFOL 10 MG/ML IV BOLUS
INTRAVENOUS | Status: AC
Start: 2023-12-31 — End: 2023-12-31
  Filled 2023-12-31: qty 20

## 2023-12-31 MED ORDER — FENTANYL CITRATE (PF) 100 MCG/2ML IJ SOLN
INTRAMUSCULAR | Status: AC
  Filled 2023-12-31: qty 2

## 2023-12-31 MED ORDER — LACTATED RINGERS IV SOLN: INTRAVENOUS | Status: DC

## 2023-12-31 MED ORDER — SCOPOLAMINE 1 MG/3DAYS TD PT72
MEDICATED_PATCH | TRANSDERMAL | Status: AC
  Filled 2023-12-31: qty 1

## 2023-12-31 MED ORDER — DOCUSATE SODIUM 100 MG PO CAPS
100.0000 mg | ORAL_CAPSULE | Freq: Two times a day (BID) | ORAL | Status: DC
  Administered 2023-12-31 – 2024-01-08 (×17): 100 mg via ORAL
  Filled 2023-12-31 (×18): qty 1

## 2023-12-31 MED ORDER — CEFAZOLIN SODIUM-DEXTROSE 2-3 GM-%(50ML) IV SOLR
INTRAVENOUS | Status: DC | PRN
Start: 2023-12-31 — End: 2023-12-31
  Administered 2023-12-31: 2 g via INTRAVENOUS

## 2023-12-31 MED ORDER — METHOCARBAMOL 500 MG PO TABS
500.0000 mg | ORAL_TABLET | Freq: Four times a day (QID) | ORAL | Status: DC | PRN
  Administered 2023-12-31 – 2024-01-03 (×4): 500 mg via ORAL
  Filled 2023-12-31 (×5): qty 1

## 2023-12-31 MED ORDER — BUPIVACAINE LIPOSOME 1.3 % IJ SUSP: 20.0000 mL | Freq: Once | INTRAMUSCULAR | Status: DC

## 2023-12-31 MED ORDER — PANTOPRAZOLE SODIUM 40 MG PO TBEC
40.0000 mg | DELAYED_RELEASE_TABLET | Freq: Every day | ORAL | Status: DC
  Administered 2024-01-01 – 2024-01-08 (×8): 40 mg via ORAL
  Filled 2023-12-31 (×8): qty 1

## 2023-12-31 SURGICAL SUPPLY — 42 items
ATTUNE MED DOME PAT 38 KNEE (Knees) IMPLANT
ATTUNE PS FEM LT SZ 5 CEM KNEE (Femur) IMPLANT
ATTUNE PSRP INSR SZ5 8 KNEE (Insert) IMPLANT
BAG COUNTER SPONGE SURGICOUNT (BAG) IMPLANT
BAG ZIPLOCK 12X15 (MISCELLANEOUS) ×1 IMPLANT
BASE TIBIAL ROT PLAT SZ 5 KNEE (Knees) IMPLANT
BLADE SAG 18X100X1.27 (BLADE) ×1 IMPLANT
BLADE SAW SGTL 11.0X1.19X90.0M (BLADE) ×1 IMPLANT
BNDG ELASTIC 6INX 5YD STR LF (GAUZE/BANDAGES/DRESSINGS) ×1 IMPLANT
BOWL SMART MIX CTS (DISPOSABLE) ×1 IMPLANT
CEMENT HV SMART SET (Cement) ×2 IMPLANT
COVER SURGICAL LIGHT HANDLE (MISCELLANEOUS) ×1 IMPLANT
CUFF TRNQT CYL 34X4.125X (TOURNIQUET CUFF) ×1 IMPLANT
DERMABOND ADVANCED .7 DNX12 (GAUZE/BANDAGES/DRESSINGS) ×1 IMPLANT
DRAPE U-SHAPE 47X51 STRL (DRAPES) ×1 IMPLANT
DRSG AQUACEL AG ADV 3.5X10 (GAUZE/BANDAGES/DRESSINGS) ×1 IMPLANT
DURAPREP 26ML APPLICATOR (WOUND CARE) ×1 IMPLANT
ELECT REM PT RETURN 15FT ADLT (MISCELLANEOUS) ×1 IMPLANT
GLOVE BIO SURGEON STRL SZ 6.5 (GLOVE) IMPLANT
GLOVE BIO SURGEON STRL SZ7 (GLOVE) IMPLANT
GLOVE BIO SURGEON STRL SZ8 (GLOVE) ×1 IMPLANT
GLOVE BIOGEL PI IND STRL 7.0 (GLOVE) ×1 IMPLANT
GLOVE BIOGEL PI IND STRL 8 (GLOVE) ×1 IMPLANT
GOWN STRL REUS W/ TWL LRG LVL3 (GOWN DISPOSABLE) ×1 IMPLANT
IMMOBILIZER KNEE 20 THIGH 36 (SOFTGOODS) ×1 IMPLANT
KIT TURNOVER KIT A (KITS) ×1 IMPLANT
MANIFOLD NEPTUNE II (INSTRUMENTS) ×1 IMPLANT
NS IRRIG 1000ML POUR BTL (IV SOLUTION) ×1 IMPLANT
PACK TOTAL KNEE CUSTOM (KITS) ×1 IMPLANT
PADDING CAST COTTON 6X4 STRL (CAST SUPPLIES) ×2 IMPLANT
PENCIL SMOKE EVACUATOR (MISCELLANEOUS) ×1 IMPLANT
PIN STEINMAN FIXATION KNEE (PIN) IMPLANT
PROTECTOR NERVE ULNAR (MISCELLANEOUS) ×1 IMPLANT
SET HNDPC FAN SPRY TIP SCT (DISPOSABLE) ×1 IMPLANT
SUT MNCRL AB 4-0 PS2 18 (SUTURE) ×1 IMPLANT
SUT VIC AB 2-0 CT1 TAPERPNT 27 (SUTURE) ×3 IMPLANT
SUTURE STRATFX 0 PDS 27 VIOLET (SUTURE) ×1 IMPLANT
TOWEL GREEN STERILE FF (TOWEL DISPOSABLE) ×1 IMPLANT
TRAY FOLEY MTR SLVR 14FR STAT (SET/KITS/TRAYS/PACK) IMPLANT
TUBE SUCTION HIGH CAP CLEAR NV (SUCTIONS) ×1 IMPLANT
WATER STERILE IRR 1000ML POUR (IV SOLUTION) ×2 IMPLANT
WRAP KNEE MAXI GEL POST OP (GAUZE/BANDAGES/DRESSINGS) ×1 IMPLANT

## 2023-12-31 NOTE — Anesthesia Preprocedure Evaluation (Addendum)
 Anesthesia Evaluation  Patient identified by MRN, date of birth, ID band Patient awake    Reviewed: Allergy & Precautions, H&P , NPO status , Patient's Chart, lab work & pertinent test results  Airway Mallampati: III  TM Distance: >3 FB Neck ROM: Full    Dental  (+) Teeth Intact, Dental Advisory Given   Pulmonary neg pulmonary ROS   Pulmonary exam normal breath sounds clear to auscultation       Cardiovascular hypertension (129/54 preop), Pt. on medications Normal cardiovascular exam Rhythm:Regular Rate:Normal     Neuro/Psych negative neurological ROS  negative psych ROS   GI/Hepatic Neg liver ROS,GERD  Controlled,,  Endo/Other  negative endocrine ROS    Renal/GU Renal InsufficiencyRenal disease  negative genitourinary   Musculoskeletal  (+) Arthritis , Osteoarthritis,    Abdominal   Peds negative pediatric ROS (+)  Hematology negative hematology ROS (+) Hb 15.1, plt 232   Anesthesia Other Findings   Reproductive/Obstetrics negative OB ROS                              Anesthesia Physical Anesthesia Plan  ASA: 3  Anesthesia Plan: Spinal, Regional and MAC   Post-op Pain Management: Tylenol  PO (pre-op)*   Induction:   PONV Risk Score and Plan: 2 and Propofol  infusion and TIVA  Airway Management Planned: Natural Airway and Nasal Cannula  Additional Equipment: None  Intra-op Plan:   Post-operative Plan:   Informed Consent: I have reviewed the patients History and Physical, chart, labs and discussed the procedure including the risks, benefits and alternatives for the proposed anesthesia with the patient or authorized representative who has indicated his/her understanding and acceptance.   Patient has DNR.  Discussed DNR with patient, Discussed DNR with power of attorney and Continue DNR.     Plan Discussed with: CRNA  Anesthesia Plan Comments: (Pt and family would like to  be DNR in the event of a cardiac arrest- ok with temporary intubation and pressors as well as ICU admission. Withhold chest compressions and defibrillation. )         Anesthesia Quick Evaluation

## 2023-12-31 NOTE — Anesthesia Procedure Notes (Signed)
 Procedure Name: MAC Date/Time: 12/31/2023 9:25 AM  Performed by: Metta Andrea NOVAK, CRNAPre-anesthesia Checklist: Patient identified, Emergency Drugs available, Suction available, Patient being monitored and Timeout performed Oxygen Delivery Method: Simple face mask Placement Confirmation: positive ETCO2

## 2023-12-31 NOTE — Transfer of Care (Signed)
 Immediate Anesthesia Transfer of Care Note  Patient: Gina [name]  Procedure(s) Performed: ARTHROPLASTY, KNEE, TOTAL (Left: Knee)  Patient Location: PACU  Anesthesia Type:Spinal  Level of Consciousness: awake, alert , and oriented  Airway & Oxygen Therapy: Patient Spontanous Breathing and Patient connected to face mask oxygen  Post-op Assessment: Report given to RN and Post -op Vital signs reviewed and stable  Post vital signs: Reviewed and stable  Last Vitals:  Vitals Value Taken Time  BP 99/57 12/31/23 10:56  Temp    Pulse 71 12/31/23 10:58  Resp 28 12/31/23 10:58  SpO2 100 % 12/31/23 10:58  Vitals shown include unfiled device data.  Last Pain:  Vitals:   12/31/23 0734  TempSrc:   PainSc: 0-No pain         Complications: No notable events documented.

## 2023-12-31 NOTE — Interval H&P Note (Signed)
 History and Physical Interval Note:  12/31/2023 7:06 AM  Gina [name]  has presented today for surgery, with the diagnosis of Left knee osteoarthritis.  The various methods of treatment have been discussed with the patient and family. After consideration of risks, benefits and other options for treatment, the patient has consented to  Procedure(s): ARTHROPLASTY, KNEE, TOTAL (Left) as a surgical intervention.  The patient's history has been reviewed, patient examined, no change in status, stable for surgery.  I have reviewed the patient's chart and labs.  Questions were answered to the patient's satisfaction.     Gina [name]

## 2023-12-31 NOTE — Plan of Care (Signed)
   Problem: Coping: Goal: Level of anxiety will decrease Outcome: Progressing   Problem: Pain Managment: Goal: General experience of comfort will improve and/or be controlled Outcome: Progressing   Problem: Safety: Goal: Ability to remain free from injury will improve Outcome: Progressing

## 2023-12-31 NOTE — Progress Notes (Signed)
 Orthopedic Tech Progress Note Patient Details:  Gina [name] May 25, 1932 [id number] Applied CPM per order. Will remove at 6:10pm.  CPM Left Knee CPM Left Knee: On Left Knee Flexion (Degrees): 40 Left Knee Extension (Degrees): 10  Post Interventions Patient Tolerated: Well Instructions Provided: Adjustment of device, Care of device, Poper ambulation with device Ortho Devices Type of Ortho Device: CPM padding Ortho Device/Splint Location: LLE Ortho Device/Splint Interventions: Ordered, Application, Adjustment   Post Interventions Patient Tolerated: Well Instructions Provided: Adjustment of device, Care of device, Poper ambulation with device  Morna Pink 12/31/2023, 2:16 PM

## 2023-12-31 NOTE — Discharge Instructions (Signed)
Gaynelle Arabian, MD Total Joint Specialist [facility] [place] [address], [address] [place], [place] [address] [phone]  TOTAL KNEE REPLACEMENT POSTOPERATIVE DIRECTIONS    Knee Rehabilitation, Guidelines Following Surgery  Results after knee surgery are often greatly improved when you follow the exercise, range of motion and muscle strengthening exercises prescribed by your doctor. Safety measures are also important to protect the knee from further injury. If any of these exercises cause you to have increased pain or swelling in your knee joint, decrease the amount until you are comfortable again and slowly increase them. If you have problems or questions, call your caregiver or physical therapist for advice.   BLOOD CLOT PREVENTION Take an 81 mg Aspirin two times a day for three weeks following surgery. Then take an 81 mg Aspirin once a day for three weeks. Then discontinue Aspirin. You may resume your vitamins/supplements upon discharge from the hospital. Do not take any NSAIDs (Advil, Aleve, Ibuprofen, Meloxicam, etc.) until you are 3 weeks out from surgery.  HOME CARE INSTRUCTIONS  Remove items at home which could result in a fall. This includes throw rugs or furniture in walking pathways.  ICE to the affected knee as much as tolerated. Icing helps control swelling. If the swelling is well controlled you will be more comfortable and rehab easier. Continue to use ice on the knee for pain and swelling from surgery. You may notice swelling that will progress down to the foot and ankle. This is normal after surgery. Elevate the leg when you are not up walking on it.    Continue to use the breathing machine which will help keep your temperature down. It is common for your temperature to cycle up and down following surgery, especially at night when you are not up moving around and exerting yourself. The breathing machine keeps your lungs expanded and your temperature down. Do  not place pillow under the operative knee, focus on keeping the knee straight while resting  DIET You may resume your previous home diet once you are discharged from the hospital.  DRESSING / WOUND CARE / SHOWERING Keep your bulky bandage on for 2 days. On the third post-operative day you may remove the Ace bandage and gauze. There is a waterproof adhesive bandage on your skin which will stay in place until your first follow-up appointment. Once you remove this you will not need to place another bandage You may begin showering 3 days following surgery, but do not submerge the incision under water.  ACTIVITY For the first 5 days, the key is rest and control of pain and swelling Do your home exercises twice a day starting on post-operative day 3. On the days you go to physical therapy, just do the home exercises once that day. You should rest, ice and elevate the leg for 50 minutes out of every hour. Get up and walk/stretch for 10 minutes per hour. After 5 days you can increase your activity slowly as tolerated. Walk with your walker as instructed. Use the walker until you are comfortable transitioning to a cane. Walk with the cane in the opposite hand of the operative leg. You may discontinue the cane once you are comfortable and walking steadily. Avoid periods of inactivity such as sitting longer than an hour when not asleep. This helps prevent blood clots.  You may discontinue the knee immobilizer once you are able to perform a straight leg raise while lying down. You may resume a sexual relationship in one month  or when given the OK by your doctor.  You may return to work once you are cleared by your doctor.  Do not drive a car for 6 weeks or until released by your surgeon.  Do not drive while taking narcotics.  TED HOSE STOCKINGS Wear the elastic stockings on both legs for three weeks following surgery during the day. You may remove them at night for sleeping.  WEIGHT BEARING Weight  bearing as tolerated with assist device (walker, cane, etc) as directed, use it as long as suggested by your surgeon or therapist, typically at least 4-6 weeks.  POSTOPERATIVE CONSTIPATION PROTOCOL Constipation - defined medically as fewer than three stools per week and severe constipation as less than one stool per week.  One of the most common issues patients have following surgery is constipation.  Even if you have a regular bowel pattern at home, your normal regimen is likely to be disrupted due to multiple reasons following surgery.  Combination of anesthesia, postoperative narcotics, change in appetite and fluid intake all can affect your bowels.  In order to avoid complications following surgery, here are some recommendations in order to help you during your recovery period.  Colace (docusate) - Pick up an over-the-counter form of Colace or another stool softener and take twice a day as long as you are requiring postoperative pain medications.  Take with a full glass of water daily.  If you experience loose stools or diarrhea, hold the colace until you stool forms back up. If your symptoms do not get better within 1 week or if they get worse, check with your doctor. Dulcolax (bisacodyl) - Pick up over-the-counter and take as directed by the product packaging as needed to assist with the movement of your bowels.  Take with a full glass of water.  Use this product as needed if not relieved by Colace only.  MiraLax (polyethylene glycol) - Pick up over-the-counter to have on hand. MiraLax is a solution that will increase the amount of water in your bowels to assist with bowel movements.  Take as directed and can mix with a glass of water, juice, soda, coffee, or tea. Take if you go more than two days without a movement. Do not use MiraLax more than once per day. Call your doctor if you are still constipated or irregular after using this medication for 7 days in a row.  If you continue to have problems  with postoperative constipation, please contact the office for further assistance and recommendations.  If you experience "the worst abdominal pain ever" or develop nausea or vomiting, please contact the office immediatly for further recommendations for treatment.  ITCHING If you experience itching with your medications, try taking only a single pain pill, or even half a pain pill at a time.  You can also use Benadryl over the counter for itching or also to help with sleep.   MEDICATIONS See your medication summary on the "After Visit Summary" that the nursing staff will review with you prior to discharge.  You may have some home medications which will be placed on hold until you complete the course of blood thinner medication.  It is important for you to complete the blood thinner medication as prescribed by your surgeon.  Continue your approved medications as instructed at time of discharge.  PRECAUTIONS If you experience chest pain or shortness of breath - call 911 immediately for transfer to the hospital emergency department.  If you develop a fever greater that 101 F,  purulent drainage from wound, increased redness or drainage from wound, foul odor from the wound/dressing, or calf pain - CONTACT YOUR SURGEON.                                                   FOLLOW-UP APPOINTMENTS Make sure you keep all of your appointments after your operation with your surgeon and caregivers. You should call the office at the above phone number and make an appointment for approximately two weeks after the date of your surgery or on the date instructed by your surgeon outlined in the "After Visit Summary".  RANGE OF MOTION AND STRENGTHENING EXERCISES  Rehabilitation of the knee is important following a knee injury or an operation. After just a few days of immobilization, the muscles of the thigh which control the knee become weakened and shrink (atrophy). Knee exercises are designed to build up the tone and  strength of the thigh muscles and to improve knee motion. Often times heat used for twenty to thirty minutes before working out will loosen up your tissues and help with improving the range of motion but do not use heat for the first two weeks following surgery. These exercises can be done on a training (exercise) mat, on the floor, on a table or on a bed. Use what ever works the best and is most comfortable for you Knee exercises include:  Leg Lifts - While your knee is still immobilized in a splint or cast, you can do straight leg raises. Lift the leg to 60 degrees, hold for 3 sec, and slowly lower the leg. Repeat 10-20 times 2-3 times daily. Perform this exercise against resistance later as your knee gets better.  Quad and Hamstring Sets - Tighten up the muscle on the front of the thigh (Quad) and hold for 5-10 sec. Repeat this 10-20 times hourly. Hamstring sets are done by pushing the foot backward against an object and holding for 5-10 sec. Repeat as with quad sets.  Leg Slides: Lying on your back, slowly slide your foot toward your buttocks, bending your knee up off the floor (only go as far as is comfortable). Then slowly slide your foot back down until your leg is flat on the floor again. Angel Wings: Lying on your back spread your legs to the side as far apart as you can without causing discomfort.  A rehabilitation program following serious knee injuries can speed recovery and prevent re-injury in the future due to weakened muscles. Contact your doctor or a physical therapist for more information on knee rehabilitation.   POST-OPERATIVE OPIOID TAPER INSTRUCTIONS: It is important to wean off of your opioid medication as soon as possible. If you do not need pain medication after your surgery it is ok to stop day one. Opioids include: Codeine, Hydrocodone(Norco, Vicodin), Oxycodone(Percocet, oxycontin) and hydromorphone amongst others.  Long term and even short term use of opiods can  cause: Increased pain response Dependence Constipation Depression Respiratory depression And more.  Withdrawal symptoms can include Flu like symptoms Nausea, vomiting And more Techniques to manage these symptoms Hydrate well Eat regular healthy meals Stay active Use relaxation techniques(deep breathing, meditating, yoga) Do Not substitute Alcohol to help with tapering If you have been on opioids for less than two weeks and do not have pain than it is ok to stop all together.  Plan  to wean off of opioids This plan should start within one week post op of your joint replacement. Maintain the same interval or time between taking each dose and first decrease the dose.  Cut the total daily intake of opioids by one tablet each day Next start to increase the time between doses. The last dose that should be eliminated is the evening dose.   IF YOU ARE TRANSFERRED TO A SKILLED REHAB FACILITY If the patient is transferred to a skilled rehab facility following release from the hospital, a list of the current medications will be sent to the facility for the patient to continue.  When discharged from the skilled rehab facility, please have the facility set up the patient's Home Health Physical Therapy prior to being released. Also, the skilled facility will be responsible for providing the patient with their medications at time of release from the facility to include their pain medication, the muscle relaxants, and their blood thinner medication. If the patient is still at the rehab facility at time of the two week follow up appointment, the skilled rehab facility will also need to assist the patient in arranging follow up appointment in our office and any transportation needs.  MAKE SURE YOU:  Understand these instructions.  Get help right away if you are not doing well or get worse.   DENTAL ANTIBIOTICS:  In most cases prophylactic antibiotics for Dental procdeures after total joint surgery are  not necessary.  Exceptions are as follows:  1. History of prior total joint infection  2. Severely immunocompromised (Organ Transplant, cancer chemotherapy, Rheumatoid biologic meds such as Humera)  3. Poorly controlled diabetes (A1C &gt; 8.0, blood glucose over 200)  If you have one of these conditions, contact your surgeon for an antibiotic prescription, prior to your dental procedure.    Pick up stool softner and laxative for home use following surgery while on pain medications. Do not submerge incision under water. Please use good hand washing techniques while changing dressing each day. May shower starting three days after surgery. Please use a clean towel to pat the incision dry following showers. Continue to use ice for pain and swelling after surgery. Do not use any lotions or creams on the incision until instructed by your surgeon.  

## 2023-12-31 NOTE — Anesthesia Procedure Notes (Signed)
 Anesthesia Regional Block: Adductor canal block   Pre-Anesthetic Checklist: , timeout performed,  Correct Patient, Correct Site, Correct Laterality,  Correct Procedure, Correct Position, site marked,  Risks and benefits discussed,  Surgical consent,  Pre-op evaluation,  At surgeon's request and post-op pain management  Laterality: Left  Prep: Maximum Sterile Barrier Precautions used, chloraprep       Needles:  Injection technique: Single-shot  Needle Type: Echogenic Stimulator Needle     Needle Length: 9cm  Needle Gauge: 22     Additional Needles:   Procedures:,,,, ultrasound used (permanent image in chart),,    Narrative:  Start time: 12/31/2023 8:50 AM End time: 12/31/2023 8:55 AM Injection made incrementally with aspirations every 5 mL.  Performed by: Personally  Anesthesiologist: Merla Almarie HERO, DO  Additional Notes: Monitors applied. No increased pain on injection. No increased resistance to injection. Injection made in 5cc increments. Good needle visualization. Patient tolerated procedure well.

## 2023-12-31 NOTE — Anesthesia Postprocedure Evaluation (Signed)
 Anesthesia Post Note  Patient: Gina [name]  Procedure(s) Performed: ARTHROPLASTY, KNEE, TOTAL (Left: Knee)     Patient location during evaluation: PACU Anesthesia Type: Regional, MAC and Spinal Level of consciousness: awake and alert and oriented Pain management: pain level controlled Vital Signs Assessment: post-procedure vital signs reviewed and stable Respiratory status: spontaneous breathing, nonlabored ventilation and respiratory function stable Cardiovascular status: blood pressure returned to baseline and stable Postop Assessment: no headache, no backache, spinal receding and no apparent nausea or vomiting Anesthetic complications: no   No notable events documented.  Last Vitals:  Vitals:   12/31/23 1100 12/31/23 1115  BP: (!) 117/42 (!) 109/48  Pulse: 70 69  Resp: (!) 22 20  Temp:    SpO2: 100% 98%    Last Pain:  Vitals:   12/31/23 1115  TempSrc:   PainSc: 0-No pain    LLE Motor Response: Purposeful movement (12/31/23 1115) LLE Sensation: Numbness (12/31/23 1115) RLE Motor Response: Purposeful movement (12/31/23 1115) RLE Sensation: Numbness (12/31/23 1115) L Sensory Level: L4-Anterior knee, lower leg (12/31/23 1115) R Sensory Level: L5-Outer lower leg, top of foot, great toe (12/31/23 1115)  [name]

## 2023-12-31 NOTE — Anesthesia Procedure Notes (Signed)
 Spinal  Patient location during procedure: OR Reason for block: surgical anesthesia Staffing Performed: resident/CRNA  Anesthesiologist: Merla Almarie HERO, DO Resident/CRNA: Metta Andrea NOVAK, CRNA Performed by: Metta Andrea NOVAK, CRNA Authorized by: Merla Almarie HERO, DO   Preanesthetic Checklist Completed: patient identified, IV checked, site marked, risks and benefits discussed, surgical consent, monitors and equipment checked, pre-op evaluation and timeout performed Spinal Block Patient position: sitting Prep: DuraPrep and site prepped and draped Patient monitoring: continuous pulse ox, blood pressure, heart rate and cardiac monitor Approach: midline Location: L3-4 Injection technique: single-shot Needle Needle type: Pencan  Needle gauge: 24 G Needle length: 10 cm Assessment Events: CSF return Additional Notes Pt placed in sitting position, spinal kit expiration date checked and verified, timeout, + CSF, - heme, pt tolerated well. Dr Merla present and supervising throughout SAB placement. Adequate sensory level.

## 2023-12-31 NOTE — Op Note (Signed)
 OPERATIVE REPORT-TOTAL KNEE ARTHROPLASTY   Pre-operative diagnosis- Osteoarthritis  Left knee(s)  Post-operative diagnosis- Osteoarthritis Left knee(s)  Procedure-  Left  Total Knee Arthroplasty  Surgeon- Dempsey GAILS. Tristin Gladman, MD  Assistant- Corean Sender, PA-C   Anesthesia-  Adductor canal block and spinal  EBL-50 mL   Drains None  Tourniquet time-  Total Tourniquet Time Documented: Thigh (Left) - 28 minutes Total: Thigh (Left) - 28 minutes     Complications- None  Condition-PACU - hemodynamically stable.   Brief Clinical Note  Gina [name] is a 89 y.o. year old female with end stage OA of her left knee with progressively worsening pain and dysfunction. She has constant pain, with activity and at rest and significant functional deficits with difficulties even with ADLs. She has had extensive non-op management including analgesics, injections of cortisone and viscosupplements, and home exercise program, but remains in significant pain with significant dysfunction. Radiographs show bone on bone arthritis all 3 compartments. She presents now for left Total Knee Arthroplasty.     Procedure in detail---   The patient is brought into the operating room and positioned supine on the operating table. After successful administration of  Adductor canal block and spinal,   a tourniquet is placed high on the  Left thigh(s) and the lower extremity is prepped and draped in the usual sterile fashion. Time out is performed by the operating team and then the  Left lower extremity is wrapped in Esmarch, knee flexed and the tourniquet inflated to 300 mmHg.       A midline incision is made with a ten blade through the subcutaneous tissue to the level of the extensor mechanism. A fresh blade is used to make a medial parapatellar arthrotomy. Soft tissue over the proximal medial tibia is subperiosteally elevated to the joint line with a knife and into the semimembranosus bursa with a Cobb elevator.  Soft tissue over the proximal lateral tibia is elevated with attention being paid to avoiding the patellar tendon on the tibial tubercle. The patella is everted, knee flexed 90 degrees and the ACL and PCL are removed. Findings are bone on bone all 3 compartments with large   global osteophytes       The drill is used to create a starting hole in the distal femur and the canal is thoroughly irrigated with sterile saline to remove the fatty contents. The 5 degree Left  valgus alignment guide is placed into the femoral canal and the distal femoral cutting block is pinned to remove 10 mm off the distal femur. Resection is made with an oscillating saw.      The tibia is subluxed forward and the menisci are removed. The extramedullary alignment guide is placed referencing proximally at the medial aspect of the tibial tubercle and distally along the second metatarsal axis and tibial crest. The block is pinned to remove 2mm off the more deficient medial  side. Resection is made with an oscillating saw. Size 5is the most appropriate size for the tibia and the proximal tibia is prepared with the modular drill and keel punch for that size.      The femoral sizing guide is placed and size 5 is most appropriate. Rotation is marked off the epicondylar axis and confirmed by creating a rectangular flexion gap at 90 degrees. The size 5 cutting block is pinned in this rotation and the anterior, posterior and chamfer cuts are made with the oscillating saw. The intercondylar block is then placed and that cut is  made.      Trial size 5 tibial component, trial size 5 posterior stabilized femur and a 8  mm posterior stabilized rotating platform insert trial is placed. Full extension is achieved with excellent varus/valgus and anterior/posterior balance throughout full range of motion. The patella is everted and thickness measured to be 22  mm. Free hand resection is taken to 12 mm, a 38 template is placed, lug holes are drilled, trial  patella is placed, and it tracks normally. Osteophytes are removed off the posterior femur with the trial in place. All trials are removed and the cut bone surfaces prepared with pulsatile lavage. Cement is mixed and once ready for implantation, the size 5 tibial implant, size  5 posterior stabilized femoral component, and the size 38 patella are cemented in place and the patella is held with the clamp. The trial insert is placed and the knee held in full extension. The Exparel  (20 ml mixed with 60 ml saline) is injected into the extensor mechanism, posterior capsule, medial and lateral gutters and subcutaneous tissues.  All extruded cement is removed and once the cement is hard the permanent 8 mm posterior stabilized rotating platform insert is placed into the tibial tray.      The wound is copiously irrigated with saline solution and the extensor mechanism closed with # 0 Stratofix suture. The tourniquet is released for a total tourniquet time of 28  minutes. Flexion against gravity is 140 degrees and the patella tracks normally. Subcutaneous tissue is closed with 2.0 vicryl and subcuticular with running 4.0 Monocryl. The incision is cleaned and dried and steri-strips and a bulky sterile dressing are applied. The limb is placed into a knee immobilizer and the patient is awakened and transported to recovery in stable condition.      Please note that a surgical assistant was a medical necessity for this procedure in order to perform it in a safe and expeditious manner. Surgical assistant was necessary to retract the ligaments and vital neurovascular structures to prevent injury to them and also necessary for proper positioning of the limb to allow for anatomic placement of the prosthesis.   Dempsey ROCKFORD Savaughn Karwowski, MD    12/31/2023, 10:50 AM

## 2023-12-31 NOTE — Evaluation (Signed)
 Physical Therapy Evaluation Patient Details Name: Gina [name] MRN: [id number] DOB: [birth year] Today's Date: 12/31/2023  History of Present Illness  89 yo female presents to therapy s/p L TKA on 12/31/2023 due to failure of conservative measures. Pt PMH includes but is not limited to: CKD III, SIRS, fall, hypotension, hypokalemia, GERD, HTN, R foot surgery, R TKA (2010) and R shoulder surgery.  Clinical Impression    Gina [name] is a 89 y.o. female POD 0 s/p L TKA. Patient reports mod I with mobility at baseline. Patient is now limited by functional impairments (see PT problem list below) and requires min A for bed mobility and CGA for transfers. Patient was able to ambulate 25 feet with RW and CGA level of assist. Patient will benefit from continued skilled PT interventions to address impairments and progress towards PLOF. Acute PT will follow to progress mobility and stair training in preparation for safe discharge to daughter's home with OPPT services.       If plan is discharge home, recommend the following: A little help with walking and/or transfers;A little help with bathing/dressing/bathroom;Assistance with cooking/housework;Help with stairs or ramp for entrance;Assist for transportation   Can travel by private vehicle        Equipment Recommendations None recommended by PT  Recommendations for Other Services       Functional Status Assessment Patient has had a recent decline in their functional status and demonstrates the ability to make significant improvements in function in a reasonable and predictable amount of time.     Precautions / Restrictions Precautions Precautions: Knee;Fall      Mobility  Bed Mobility Overal bed mobility: Needs Assistance Bed Mobility: Supine to Sit     Supine to sit: Min assist     General bed mobility comments: min cues increased time and use of hospital bed    Transfers Overall transfer level: Needs assistance   Transfers: Sit  to/from Stand Sit to Stand: Contact guard assist           General transfer comment: min cues and pull to stand    Ambulation/Gait Ambulation/Gait assistance: Contact guard assist Gait Distance (Feet): 25 Feet Assistive device: Rolling walker (2 wheels) Gait Pattern/deviations: Step-to pattern, Decreased stance time - left, Antalgic, Trunk flexed Gait velocity: decreased     General Gait Details: slight trunk flexion with B UE support at RW to offload L LE in stance phase, min cues for posture, safety and RW mangement '  Stairs            Wheelchair Mobility     Tilt Bed    Modified Rankin (Stroke Patients Only)       Balance Overall balance assessment: Needs assistance Sitting-balance support: Feet supported Sitting balance-Leahy Scale: Good     Standing balance support: Bilateral upper extremity supported, During functional activity, Reliant on assistive device for balance Standing balance-Leahy Scale: Poor                               Pertinent Vitals/Pain Pain Assessment Pain Assessment: 0-10 Pain Score: 5  Pain Location: L knee and LE Pain Descriptors / Indicators: Aching, Constant, Operative site guarding Pain Intervention(s): Limited activity within patient's tolerance, Monitored during session, Premedicated before session, Repositioned, Ice applied    Home Living Family/patient expects to be discharged to:: Private residence Living Arrangements: Alone (Going to her daughter's house  post discharge) Available Help at Discharge: Family Type  of Home: House Home Access: Stairs to enter Entrance Stairs-Rails: Doctor, general practice of Steps: 3   Home Layout: One level Home Equipment: Agricultural consultant (2 wheels);Cane - single point      Prior Function Prior Level of Function : Independent/Modified Independent;Driving             Mobility Comments: mod I with RW for all ADLs, self care tasks and IADLs ADLs Comments:  daughter checks in with pt     Extremity/Trunk Assessment        Lower Extremity Assessment Lower Extremity Assessment: LLE deficits/detail LLE Deficits / Details: ankle DF 4/5, PF 5/5 and SLR < 10 degree lag LLE Sensation: WNL    Cervical / Trunk Assessment Cervical / Trunk Assessment: Kyphotic  Communication   Communication Communication: Impaired Factors Affecting Communication: Hearing impaired    Cognition Arousal: Alert Behavior During Therapy: WFL for tasks assessed/performed   PT - Cognitive impairments: No apparent impairments                         Following commands: Intact       Cueing       General Comments      Exercises     Assessment/Plan    PT Assessment Patient needs continued PT services  PT Problem List Decreased strength;Decreased range of motion;Decreased activity tolerance;Decreased mobility;Decreased balance;Decreased coordination;Pain       PT Treatment Interventions DME instruction;Gait training;Stair training;Functional mobility training;Therapeutic activities;Therapeutic exercise;Balance training;Neuromuscular re-education;Patient/family education;Modalities    PT Goals (Current goals can be found in the Care Plan section)  Acute Rehab PT Goals Patient Stated Goal: to be able to move around better PT Goal Formulation: With patient Time For Goal Achievement: 01/14/24 Potential to Achieve Goals: Good    Frequency 7X/week     Co-evaluation               AM-PAC PT 6 Clicks Mobility  Outcome Measure Help needed turning from your back to your side while in a flat bed without using bedrails?: A Little Help needed moving from lying on your back to sitting on the side of a flat bed without using bedrails?: A Little Help needed moving to and from a bed to a chair (including a wheelchair)?: A Little Help needed standing up from a chair using your arms (e.g., wheelchair or bedside chair)?: A Little Help needed to  walk in hospital room?: A Little Help needed climbing 3-5 steps with a railing? : Total 6 Click Score: 16    End of Session Equipment Utilized During Treatment: Gait belt Activity Tolerance: No increased pain;Patient tolerated treatment well Patient left: in chair;with chair alarm set;with call bell/phone within reach Nurse Communication: Mobility status PT Visit Diagnosis: Unsteadiness on feet (R26.81);Other abnormalities of gait and mobility (R26.89);Muscle weakness (generalized) (M62.81);Pain;Difficulty in walking, not elsewhere classified (R26.2) Pain - Right/Left: Left Pain - part of body: Leg;Knee    Time: 8446-8393 PT Time Calculation (min) (ACUTE ONLY): 13 min   Charges:   PT Evaluation $PT Eval Low Complexity: 1 Low   PT General Charges $$ ACUTE PT VISIT: 1 Visit         [place], PT Acute Rehab   [place] VEAR Drone 12/31/2023, 4:52 PM

## 2024-01-01 ENCOUNTER — Encounter (HOSPITAL_COMMUNITY): Payer: Self-pay | Admitting: Orthopedic Surgery

## 2024-01-01 ENCOUNTER — Other Ambulatory Visit (HOSPITAL_COMMUNITY): Payer: Self-pay

## 2024-01-01 LAB — CBC
HCT: 36.6 % (ref 36.0–46.0)
Hemoglobin: 12.4 g/dL (ref 12.0–15.0)
MCH: 32.4 pg (ref 26.0–34.0)
MCHC: 33.9 g/dL (ref 30.0–36.0)
MCV: 95.6 fL (ref 80.0–100.0)
Platelets: 166 K/uL (ref 150–400)
RBC: 3.83 MIL/uL — ABNORMAL LOW (ref 3.87–5.11)
RDW: 12.6 % (ref 11.5–15.5)
WBC: 10.5 K/uL (ref 4.0–10.5)
nRBC: 0 % (ref 0.0–0.2)

## 2024-01-01 LAB — BASIC METABOLIC PANEL WITH GFR
Anion gap: 14 (ref 5–15)
BUN: 20 mg/dL (ref 8–23)
CO2: 17 mmol/L — ABNORMAL LOW (ref 22–32)
Calcium: 9.6 mg/dL (ref 8.9–10.3)
Chloride: 107 mmol/L (ref 98–111)
Creatinine, Ser: 1.12 mg/dL — ABNORMAL HIGH (ref 0.44–1.00)
GFR, Estimated: 47 mL/min — ABNORMAL LOW
Glucose, Bld: 121 mg/dL — ABNORMAL HIGH (ref 70–99)
Potassium: 3.4 mmol/L — ABNORMAL LOW (ref 3.5–5.1)
Sodium: 138 mmol/L (ref 135–145)

## 2024-01-01 LAB — URINALYSIS, ROUTINE W REFLEX MICROSCOPIC
Bilirubin Urine: NEGATIVE
Glucose, UA: NEGATIVE mg/dL
Hgb urine dipstick: NEGATIVE
Ketones, ur: NEGATIVE mg/dL
Leukocytes,Ua: NEGATIVE
Nitrite: NEGATIVE
Protein, ur: NEGATIVE mg/dL
Specific Gravity, Urine: 1.026 (ref 1.005–1.030)
pH: 5 (ref 5.0–8.0)

## 2024-01-01 MED ORDER — TRAMADOL HCL 50 MG PO TABS
50.0000 mg | ORAL_TABLET | Freq: Four times a day (QID) | ORAL | 0 refills | Status: DC | PRN
  Filled 2024-01-01: qty 40, 5d supply, fill #0

## 2024-01-01 MED ORDER — ASPIRIN 81 MG PO CHEW
81.0000 mg | CHEWABLE_TABLET | Freq: Two times a day (BID) | ORAL | 0 refills | Status: AC
  Filled 2024-01-01: qty 63, 42d supply, fill #0

## 2024-01-01 MED ORDER — ONDANSETRON HCL 4 MG PO TABS
4.0000 mg | ORAL_TABLET | Freq: Four times a day (QID) | ORAL | 0 refills | Status: DC | PRN
  Filled 2024-01-01: qty 20, 5d supply, fill #0

## 2024-01-01 MED ORDER — METHOCARBAMOL 500 MG PO TABS
500.0000 mg | ORAL_TABLET | Freq: Four times a day (QID) | ORAL | 0 refills | Status: DC | PRN
  Filled 2024-01-01: qty 40, 10d supply, fill #0

## 2024-01-01 MED ORDER — HYDROMORPHONE HCL 2 MG PO TABS
1.0000 mg | ORAL_TABLET | Freq: Four times a day (QID) | ORAL | 0 refills | Status: DC | PRN
  Filled 2024-01-01: qty 21, 6d supply, fill #0

## 2024-01-01 NOTE — Progress Notes (Signed)
 Physical Therapy Treatment Patient Details Name: Gina [name] MRN: [id number] DOB: [birth year] Today's Date: 01/01/2024   History of Present Illness 89 yo female presents to therapy s/p L TKA on 12/31/2023 due to failure of conservative measures. Pt PMH includes but is not limited to: CKD III, SIRS, fall, hypotension, hypokalemia, GERD, HTN, R foot surgery, R TKA (2010) and R shoulder surgery.    PT Comments  Pt agreeable to work with therapy. Some mild confusion noted. She participated well. Reports pain with activity. Will plan to have a 2nd session to practice stair negotiation. Would recommend supervision at home for safety.     If plan is discharge home, recommend the following: A little help with walking and/or transfers;A little help with bathing/dressing/bathroom;Assistance with cooking/housework;Help with stairs or ramp for entrance;Assist for transportation   Can travel by private vehicle        Equipment Recommendations  None recommended by PT    Recommendations for Other Services       Precautions / Restrictions Precautions Precautions: Knee;Fall Restrictions Weight Bearing Restrictions Per Provider Order: No LLE Weight Bearing Per Provider Order: Weight bearing as tolerated     Mobility  Bed Mobility Overal bed mobility: Needs Assistance Bed Mobility: Supine to Sit     Supine to sit: Contact guard     General bed mobility comments: Cues for safety    Transfers Overall transfer level: Needs assistance Equipment used: Rolling walker (2 wheels) Transfers: Sit to/from Stand Sit to Stand: Contact guard assist           General transfer comment: Cues for safety.    Ambulation/Gait Ambulation/Gait assistance: Contact guard assist Gait Distance (Feet): 50 Feet Assistive device: Rolling walker (2 wheels) Gait Pattern/deviations: Antalgic, Decreased stance time - left, Step-to pattern       General Gait Details: Cues for safety, RW proximity, proper  use of RW. Distance limited by pain.   Stairs             Wheelchair Mobility     Tilt Bed    Modified Rankin (Stroke Patients Only)       Balance Overall balance assessment: Needs assistance         Standing balance support: Bilateral upper extremity supported, During functional activity, Reliant on assistive device for balance Standing balance-Leahy Scale: Poor                              Communication Communication Communication: Impaired Factors Affecting Communication: Hearing impaired  Cognition Arousal: Alert Behavior During Therapy: WFL for tasks assessed/performed   PT - Cognitive impairments: Orientation   Orientation impairments: Time                   PT - Cognition Comments: some mild confusion Following commands: Intact      Cueing    Exercises Total Joint Exercises Ankle Circles/Pumps: AROM, Both, 10 reps Quad Sets: AROM, Both, 10 reps Heel Slides: AROM, Left, 10 reps Straight Leg Raises: AROM, Left, 10 reps Goniometric ROM: ~10-70 degrees    General Comments        Pertinent Vitals/Pain Pain Assessment Pain Assessment: Faces Faces Pain Scale: Hurts even more Pain Location: L knee Pain Descriptors / Indicators: Aching, Constant, Operative site guarding Pain Intervention(s): Monitored during session, Ice applied, Repositioned    Home Living  Prior Function            PT Goals (current goals can now be found in the care plan section) Progress towards PT goals: Progressing toward goals    Frequency           PT Plan      Co-evaluation              AM-PAC PT 6 Clicks Mobility   Outcome Measure  Help needed turning from your back to your side while in a flat bed without using bedrails?: A Little Help needed moving from lying on your back to sitting on the side of a flat bed without using bedrails?: A Little Help needed moving to and from a bed to a  chair (including a wheelchair)?: A Little Help needed standing up from a chair using your arms (e.g., wheelchair or bedside chair)?: A Little Help needed to walk in hospital room?: A Little Help needed climbing 3-5 steps with a railing? : A Little 6 Click Score: 18    End of Session Equipment Utilized During Treatment: Gait belt Activity Tolerance: Patient tolerated treatment well Patient left: in chair;with call bell/phone within reach;with chair alarm set   PT Visit Diagnosis: Unsteadiness on feet (R26.81);Other abnormalities of gait and mobility (R26.89);Muscle weakness (generalized) (M62.81);Pain;Difficulty in walking, not elsewhere classified (R26.2)     Time: 9081-9067 PT Time Calculation (min) (ACUTE ONLY): 14 min  Charges:    $Gait Training: 8-22 mins PT General Charges $$ ACUTE PT VISIT: 1 Visit                         Dannial SQUIBB, PT Acute Rehabilitation  Office: [phone]

## 2024-01-01 NOTE — Progress Notes (Signed)
   Subjective: 1 Day Post-Op Procedure(s) (LRB): ARTHROPLASTY, KNEE, TOTAL (Left) Patient reports pain as mild.   Patient seen in rounds by Dr. Melodi. Patient is well, and has had no acute complaints or problems No issues overnight. Denies chest pain, SOB, or calf pain. Foley catheter removed this AM.  We will continue therapy today, ambulated 25' yesterday.   Objective: Vital signs in last 24 hours: Temp:  [97.5 F (36.4 C)-97.9 F (36.6 C)] 97.8 F (36.6 C) (09/23 0514) Pulse Rate:  [59-74] 74 (09/23 0514) Resp:  [15-22] 18 (09/23 0514) BP: (91-137)/(42-70) 137/69 (09/23 0514) SpO2:  [95 %-100 %] 95 % (09/23 0514)  Intake/Output from previous day:  Intake/Output Summary (Last 24 hours) at 01/01/2024 0802 Last data filed at 01/01/2024 0600 Gross per 24 hour  Intake 2449.42 ml  Output 1050 ml  Net 1399.42 ml     Intake/Output this shift: No intake/output data recorded.  Labs: Recent Labs    01/01/24 0317  HGB 12.4   Recent Labs    01/01/24 0317  WBC 10.5  RBC 3.83*  HCT 36.6  PLT 166   Recent Labs    01/01/24 0317  NA 138  K 3.4*  CL 107  CO2 17*  BUN 20  CREATININE 1.12*  GLUCOSE 121*  CALCIUM 9.6   No results for input(s): LABPT, INR in the last 72 hours.  Exam: General - Patient is Alert and Oriented Extremity - Neurologically intact Neurovascular intact Sensation intact distally Dorsiflexion/Plantar flexion intact Dressing - dressing C/D/I Motor Function - intact, moving foot and toes well on exam.   Past Medical History:  Diagnosis Date   CKD (chronic kidney disease) stage 3, GFR 30-59 ml/min (HCC)    GERD (gastroesophageal reflux disease)    Hypertension    Pneumonia     Assessment/Plan: 1 Day Post-Op Procedure(s) (LRB): ARTHROPLASTY, KNEE, TOTAL (Left) Principal Problem:   OA (osteoarthritis) of knee Active Problems:   Primary osteoarthritis of left knee  Estimated body mass index is 24.96 kg/m as calculated from the  following:   Height as of this encounter: 5' 5 (1.651 m).   Weight as of this encounter: 68 kg. Advance diet Up with therapy D/C IV fluids   Patient's anticipated LOS is less than 2 midnights, meeting these requirements: - Lives within 1 hour of care - Has a competent adult at home to recover with post-op recover - NO history of  - Chronic pain requiring opioids  - Diabetes  - Coronary Artery Disease  - Heart failure  - Heart attack  - Stroke  - DVT/VTE  - Cardiac arrhythmia  - Respiratory Failure/COPD  - Renal failure  - Anemia  - Advanced Liver disease  DVT Prophylaxis - Aspirin  Weight bearing as tolerated. Continue therapy.  Plan is to go Home after hospital stay. Plan for discharge later today if progresses with therapy and meeting goals. Scheduled for OPPT at [facility]). Follow-up in the office in 2 weeks.  The PDMP database was reviewed today prior to any opioid medications being prescribed to this patient.  Roxie Mess, PA-C Orthopedic Surgery [phone] 01/01/2024, 8:02 AM

## 2024-01-01 NOTE — Progress Notes (Addendum)
 Physical Therapy Treatment Patient Details Name: Gina [name] MRN: [id number] DOB: Jun 25, 1932 Today's Date: 01/01/2024   History of Present Illness 89 yo female presents to therapy s/p L TKA on 12/31/2023 due to failure of conservative measures. Pt PMH includes but is not limited to: CKD III, SIRS, fall, hypotension, hypokalemia, GERD, HTN, R foot surgery, R TKA (2010) and R shoulder surgery.    PT Comments  2nd session for stair training. Intermittent Min A for mobility. Pt reports pain with activity. Daughter reports pt has some confusion-urine analysis ordered. Pt followed 1 step commands well but requires cues and reminders for safety. Pt could d/c home once medically cleared and if daughter is agreeable to taking pt home on today. Will likely require 24/7 supervision/assist.    If plan is discharge home, recommend the following: A little help with walking and/or transfers;A little help with bathing/dressing/bathroom;Assistance with cooking/housework;Help with stairs or ramp for entrance;Assist for transportation   Can travel by private vehicle        Equipment Recommendations  None recommended by PT    Recommendations for Other Services       Precautions / Restrictions Precautions Precautions: Fall;Knee Restrictions Weight Bearing Restrictions Per Provider Order: No LLE Weight Bearing Per Provider Order: Weight bearing as tolerated     Mobility  Bed Mobility Overal bed mobility: Needs Assistance Bed Mobility: Supine to Sit          General bed mobility comments: oob in recliner    Transfers Overall transfer level: Needs assistance Equipment used: Rolling walker (2 wheels) Transfers: Sit to/from Stand Sit to Stand: Min assist           General transfer comment: Cues for safety, technique, hand placement. Small amount of assist to steady    Ambulation/Gait Ambulation/Gait assistance: Min assist Gait Distance (Feet): 50 Feet Assistive device: Rolling walker  (2 wheels) Gait Pattern/deviations: Antalgic, Decreased stance time - left, Step-to pattern       General Gait Details: Cues for safety, RW proximity, proper use of RW. Distance limited by pain. Intermittent assist to steady.   Stairs Stairs: Yes Stairs assistance: Min assist Stair Management: Step to pattern, Forwards, Two rails Number of Stairs: 2 General stair comments: Up and over portable stairs x 1. Cues for safety, technique, sequencing.   Wheelchair Mobility     Tilt Bed    Modified Rankin (Stroke Patients Only)       Balance Overall balance assessment: Needs assistance         Standing balance support: Bilateral upper extremity supported, During functional activity, Reliant on assistive device for balance Standing balance-Leahy Scale: Poor                              Communication Communication Communication: Impaired Factors Affecting Communication: Hearing impaired  Cognition Arousal: Alert Behavior During Therapy: WFL for tasks assessed/performed   PT - Cognitive impairments: Orientation, Safety/Judgement, Memory   Orientation impairments: Time                   PT - Cognition Comments: some mild confusion Following commands: Intact      Cueing Cueing Techniques: Verbal cues  Exercises Total Joint Exercises Ankle Circles/Pumps: AROM, Both, 10 reps Quad Sets: AROM, Both, 10 reps Heel Slides: AROM, Left, 10 reps Straight Leg Raises: AROM, Left, 10 reps Goniometric ROM: ~10-70 degrees    General Comments  Pertinent Vitals/Pain Pain Assessment Pain Assessment: Faces Faces Pain Scale: Hurts even more Pain Location: L knee/thigh Pain Descriptors / Indicators: Aching, Constant, Operative site guarding Pain Intervention(s): Monitored during session, Repositioned    Home Living                          Prior Function            PT Goals (current goals can now be found in the care plan section)  Progress towards PT goals: Progressing toward goals    Frequency    7X/week      PT Plan      Co-evaluation              AM-PAC PT 6 Clicks Mobility   Outcome Measure  Help needed turning from your back to your side while in a flat bed without using bedrails?: A Little Help needed moving from lying on your back to sitting on the side of a flat bed without using bedrails?: A Little Help needed moving to and from a bed to a chair (including a wheelchair)?: A Little Help needed standing up from a chair using your arms (e.g., wheelchair or bedside chair)?: A Little Help needed to walk in hospital room?: A Little Help needed climbing 3-5 steps with a railing? : A Little 6 Click Score: 18    End of Session Equipment Utilized During Treatment: Gait belt Activity Tolerance: Patient tolerated treatment well Patient left: in chair;with call bell/phone within reach   PT Visit Diagnosis: Unsteadiness on feet (R26.81);Other abnormalities of gait and mobility (R26.89);Muscle weakness (generalized) (M62.81);Pain;Difficulty in walking, not elsewhere classified (R26.2) Pain - Right/Left: Left Pain - part of body: Leg;Knee     Time: 8576-8561 PT Time Calculation (min) (ACUTE ONLY): 15 min  Charges:    $Gait Training: 8-22 mins PT General Charges $$ ACUTE PT VISIT: 1 Visit                        Dannial SQUIBB, PT Acute Rehabilitation  Office: [phone]

## 2024-01-01 NOTE — TOC Transition Note (Signed)
 Transition of Care [facility]) - Discharge Note   Patient Details  Name: Gina [name] MRN: [id number] Date of Birth: 06-Nov-1932  [facility]) CM/SW Contact:  NORMAN ASPEN, LCSW Phone Number: 01/01/2024, 1:00 PM   Clinical Narrative:     Met with pt and daughter who confirm she has needed DME in the home.  OPPT already arranged with [facility].  No further Ip CM needs.  Final next level of care: OP Rehab Barriers to Discharge: No Barriers Identified   Patient Goals and CMS Choice Patient states their goals for this hospitalization and ongoing recovery are:: return home          Discharge Placement                       Discharge Plan and Services Additional resources added to the After Visit Summary for                  DME Arranged: N/A DME Agency: NA                  Social Drivers of Health (SDOH) Interventions SDOH Screenings   Food Insecurity: No Food Insecurity (12/31/2023)  Housing: Low Risk  (12/31/2023)  Transportation Needs: No Transportation Needs (12/31/2023)  Utilities: Not At Risk (12/31/2023)  Financial Resource Strain: Low Risk  (07/29/2019)   Received from [facility] visits prior to 06/10/2022.  Physical Activity: Sufficiently Active (07/29/2019)   Received from [facility] visits prior to 06/10/2022.  Social Connections: Moderately Integrated (12/31/2023)  Stress: No Stress Concern Present (07/29/2019)   Received from [facility] visits prior to 06/10/2022.  Tobacco Use: Low Risk  (12/31/2023)     Readmission Risk Interventions     No data to display

## 2024-01-01 NOTE — Care Management Obs Status (Signed)
 MEDICARE OBSERVATION STATUS NOTIFICATION   Patient Details  Name: Gina [name] MRN: [id number] Date of Birth: 1932-04-26   Medicare Observation Status Notification Given:  Yes  Patient/ daughter declined to sign.  NORMAN ASPEN, LCSW 01/01/2024, 12:35 PM

## 2024-01-01 NOTE — Plan of Care (Signed)
  Problem: Health Behavior/Discharge Planning: Goal: Ability to manage health-related needs will improve Outcome: Adequate for Discharge   Problem: Clinical Measurements: Goal: Ability to maintain clinical measurements within normal limits will improve Outcome: Adequate for Discharge Goal: Will remain free from infection Outcome: Adequate for Discharge

## 2024-01-02 ENCOUNTER — Other Ambulatory Visit (HOSPITAL_COMMUNITY): Payer: Self-pay

## 2024-01-02 LAB — CBC
HCT: 34.1 % — ABNORMAL LOW (ref 36.0–46.0)
Hemoglobin: 11.5 g/dL — ABNORMAL LOW (ref 12.0–15.0)
MCH: 32.4 pg (ref 26.0–34.0)
MCHC: 33.7 g/dL (ref 30.0–36.0)
MCV: 96.1 fL (ref 80.0–100.0)
Platelets: 160 K/uL (ref 150–400)
RBC: 3.55 MIL/uL — ABNORMAL LOW (ref 3.87–5.11)
RDW: 12.9 % (ref 11.5–15.5)
WBC: 10.9 K/uL — ABNORMAL HIGH (ref 4.0–10.5)
nRBC: 0 % (ref 0.0–0.2)

## 2024-01-02 NOTE — Progress Notes (Signed)
 Physical Therapy Treatment Patient Details Name: Gina [name] MRN: [id number] DOB: 1932/10/26 Today's Date: 01/02/2024   History of Present Illness 89 yo female presents to therapy s/p L TKA on 12/31/2023 due to failure of conservative measures. Pt PMH includes but is not limited to: CKD III, SIRS, fall, hypotension, hypokalemia, GERD, HTN, R foot surgery, R TKA (2010) and R shoulder surgery.    PT Comments  Pt agreeable to working with therapy. Daughter present during session. Pt continues to c/o pain with all activity; pain level better at rest. She ambulated ~40 feet with a RW. She ascended/descended portable stairs with use of 2 rails. Daughter then informed PT that pt will be coming to her home where there are no railings to use. Pt stated she could not practice stairs a 2nd time due to her pain level. Assisted pt back to her room. Unfortunately , pt has not yet met her PT goals-made RN aware. Daughter is also concerned about pt's complaints of pain-encouraged continued discussions with nursing/surgical team. Daughter agrees to attend family ed session on tomorrow to practice stair negotiation with patient @ 11:00.      If plan is discharge home, recommend the following: A little help with walking and/or transfers;A little help with bathing/dressing/bathroom;Assistance with cooking/housework;Help with stairs or ramp for entrance;Assist for transportation   Can travel by private vehicle        Equipment Recommendations  None recommended by PT    Recommendations for Other Services       Precautions / Restrictions Precautions Precautions: Fall;Knee Restrictions Weight Bearing Restrictions Per Provider Order: No LLE Weight Bearing Per Provider Order: Weight bearing as tolerated     Mobility  Bed Mobility               General bed mobility comments: oob in recliner    Transfers Overall transfer level: Needs assistance Equipment used: Rolling walker (2 wheels) Transfers:  Sit to/from Stand Sit to Stand: Min assist           General transfer comment: Cues for safety, technique, hand placement. Assist to rise, steady, control descent. Increased time. Painful.    Ambulation/Gait Ambulation/Gait assistance: Contact guard assist Gait Distance (Feet): 40 Feet Assistive device: Rolling walker (2 wheels) Gait Pattern/deviations: Antalgic, Decreased stance time - left, Step-to pattern       General Gait Details: Cues for safety, RW proximity, proper use of RW. Distance limited by pain.   Stairs Stairs: Yes Stairs assistance: Min assist Stair Management: Step to pattern, Forwards, Two rails Number of Stairs: 2 General stair comments: Pt initially declining to practice. Able to convince pt to try. Up and over portable stairs x 1 with use of 2 rails to safely attempt. Daughter present-informed therapist that pt is coming to her house where there are not any railings. Asked pt to practice stairs with RW-she stated she could not due to her pain level.   Wheelchair Mobility     Tilt Bed    Modified Rankin (Stroke Patients Only)       Balance Overall balance assessment: Needs assistance         Standing balance support: Bilateral upper extremity supported, During functional activity, Reliant on assistive device for balance Standing balance-Leahy Scale: Poor                              Communication Communication Communication: Impaired Factors Affecting Communication: Hearing impaired  Cognition Arousal:  Alert Behavior During Therapy: WFL for tasks assessed/performed   PT - Cognitive impairments: Memory, Orientation   Orientation impairments: Time                   PT - Cognition Comments: some mild confusion Following commands: Intact      Cueing Cueing Techniques: Verbal cues  Exercises     General Comments        Pertinent Vitals/Pain Pain Assessment Pain Assessment: Faces Faces Pain Scale: Hurts whole  lot Pain Location: L knee/thigh Pain Descriptors / Indicators: Aching, Constant, Operative site guarding Pain Intervention(s): Limited activity within patient's tolerance, Monitored during session, Ice applied, Repositioned    Home Living                          Prior Function            PT Goals (current goals can now be found in the care plan section) Progress towards PT goals: Progressing toward goals    Frequency    7X/week      PT Plan      Co-evaluation              AM-PAC PT 6 Clicks Mobility   Outcome Measure  Help needed turning from your back to your side while in a flat bed without using bedrails?: A Little Help needed moving from lying on your back to sitting on the side of a flat bed without using bedrails?: A Little Help needed moving to and from a bed to a chair (including a wheelchair)?: A Little Help needed standing up from a chair using your arms (e.g., wheelchair or bedside chair)?: A Little Help needed to walk in hospital room?: A Little Help needed climbing 3-5 steps with a railing? : A Lot 6 Click Score: 17    End of Session Equipment Utilized During Treatment: Gait belt Activity Tolerance: Patient limited by pain Patient left: in chair;with call bell/phone within reach;with chair alarm set;with family/visitor present   PT Visit Diagnosis: Unsteadiness on feet (R26.81);Other abnormalities of gait and mobility (R26.89);Muscle weakness (generalized) (M62.81);Pain;Difficulty in walking, not elsewhere classified (R26.2) Pain - Right/Left: Left Pain - part of body: Leg;Knee     Time: 8554-8487 PT Time Calculation (min) (ACUTE ONLY): 27 min  Charges:    $Gait Training: 23-37 mins PT General Charges $$ ACUTE PT VISIT: 1 Visit                       Dannial SQUIBB, PT Acute Rehabilitation  Office: [phone]

## 2024-01-02 NOTE — Progress Notes (Signed)
 Physical Therapy Treatment Patient Details Name: Gina [name] MRN: [id number] DOB: April 19, 1932 Today's Date: 01/02/2024   History of Present Illness 89 yo female presents to therapy s/p L TKA on 12/31/2023 due to failure of conservative measures. Pt PMH includes but is not limited to: CKD III, SIRS, fall, hypotension, hypokalemia, GERD, HTN, R foot surgery, R TKA (2010) and R shoulder surgery.    PT Comments  Pt agreeable to working with therapy. Reports increased pain on today-  I could cry. Mobility limited by pain-requested pain meds-secure chat with RN. Will plan to have a 2nd session prior to possible d/c home later today.     If plan is discharge home, recommend the following: A little help with walking and/or transfers;A little help with bathing/dressing/bathroom;Assistance with cooking/housework;Help with stairs or ramp for entrance;Assist for transportation   Can travel by private vehicle        Equipment Recommendations  None recommended by PT    Recommendations for Other Services       Precautions / Restrictions Precautions Precautions: Fall;Knee Restrictions Weight Bearing Restrictions Per Provider Order: No LLE Weight Bearing Per Provider Order: Weight bearing as tolerated     Mobility  Bed Mobility               General bed mobility comments: oob in recliner    Transfers Overall transfer level: Needs assistance Equipment used: Rolling walker (2 wheels) Transfers: Sit to/from Stand Sit to Stand: Mod assist           General transfer comment: Cues for safety, technique, hand placement. Assist to rise, steady, control descent. Increased time. Painful per pt report.    Ambulation/Gait Ambulation/Gait assistance: Min assist Gait Distance (Feet): 12 Feet (x2) Assistive device: Rolling walker (2 wheels) Gait Pattern/deviations: Antalgic, Decreased stance time - left, Step-to pattern       General Gait Details: Cues for safety, RW proximity,  proper use of RW. Distance limited by pain. Intermittent assist to steady.   Stairs             Wheelchair Mobility     Tilt Bed    Modified Rankin (Stroke Patients Only)       Balance Overall balance assessment: Needs assistance         Standing balance support: Bilateral upper extremity supported, During functional activity, Reliant on assistive device for balance Standing balance-Leahy Scale: Poor                              Communication Communication Communication: Impaired Factors Affecting Communication: Hearing impaired  Cognition Arousal: Alert Behavior During Therapy: WFL for tasks assessed/performed   PT - Cognitive impairments: Memory, Orientation   Orientation impairments: Time                     Following commands: Intact      Cueing Cueing Techniques: Verbal cues  Exercises Total Joint Exercises Ankle Circles/Pumps: AROM, Both, 10 reps Quad Sets: AROM, Both, 10 reps Heel Slides: AAROM, Left, 10 reps, AROM Straight Leg Raises: AAROM, Left, 10 reps, AROM    General Comments        Pertinent Vitals/Pain Pain Assessment Pain Assessment: Faces Faces Pain Scale: Hurts whole lot Pain Location: L knee/thigh Pain Descriptors / Indicators: Aching, Constant, Operative site guarding Pain Intervention(s): Limited activity within patient's tolerance, Monitored during session, Ice applied, Repositioned    Home Living  Prior Function            PT Goals (current goals can now be found in the care plan section) Progress towards PT goals: Progressing toward goals    Frequency    7X/week      PT Plan      Co-evaluation              AM-PAC PT 6 Clicks Mobility   Outcome Measure  Help needed turning from your back to your side while in a flat bed without using bedrails?: A Little Help needed moving from lying on your back to sitting on the side of a flat bed without  using bedrails?: A Little Help needed moving to and from a bed to a chair (including a wheelchair)?: A Little Help needed standing up from a chair using your arms (e.g., wheelchair or bedside chair)?: A Lot Help needed to walk in hospital room?: A Little Help needed climbing 3-5 steps with a railing? : A Lot 6 Click Score: 16    End of Session Equipment Utilized During Treatment: Gait belt Activity Tolerance: Patient tolerated treatment well Patient left: in chair;with call bell/phone within reach;with chair alarm set   PT Visit Diagnosis: Unsteadiness on feet (R26.81);Other abnormalities of gait and mobility (R26.89);Muscle weakness (generalized) (M62.81);Pain;Difficulty in walking, not elsewhere classified (R26.2) Pain - Right/Left: Left Pain - part of body: Leg;Knee     Time: 8948-8888 PT Time Calculation (min) (ACUTE ONLY): 20 min  Charges:    $Gait Training: 8-22 mins PT General Charges $$ ACUTE PT VISIT: 1 Visit                         Dannial SQUIBB, PT Acute Rehabilitation  Office: [phone]

## 2024-01-02 NOTE — Progress Notes (Signed)
   Subjective: 2 Days Post-Op Procedure(s) (LRB): ARTHROPLASTY, KNEE, TOTAL (Left) Patient reports pain as mild.   Patient seen in rounds by Dr. Melodi. Patient had issues with confusion yesterday, most likely due to combination of anesthesia, hospital setting, and pain medications. All strong narcotics with the exception of tramadol  have been discontinued. No confusion noted this morning during rounds.  Plan is to go Home after hospital stay.  Objective: Vital signs in last 24 hours: Temp:  [97.5 F (36.4 C)-98.3 F (36.8 C)] 97.7 F (36.5 C) (09/24 0455) Pulse Rate:  [57-73] 57 (09/24 0455) Resp:  [17-18] 18 (09/24 0455) BP: (120-127)/(55-66) 127/55 (09/24 0455) SpO2:  [95 %-97 %] 95 % (09/24 0455)  Intake/Output from previous day:  Intake/Output Summary (Last 24 hours) at 01/02/2024 0748 Last data filed at 01/02/2024 0200 Gross per 24 hour  Intake 0 ml  Output --  Net 0 ml    Intake/Output this shift: No intake/output data recorded.  Labs: Recent Labs    01/01/24 0317 01/02/24 0321  HGB 12.4 11.5*   Recent Labs    01/01/24 0317 01/02/24 0321  WBC 10.5 10.9*  RBC 3.83* 3.55*  HCT 36.6 34.1*  PLT 166 160   Recent Labs    01/01/24 0317  NA 138  K 3.4*  CL 107  CO2 17*  BUN 20  CREATININE 1.12*  GLUCOSE 121*  CALCIUM 9.6   No results for input(s): LABPT, INR in the last 72 hours.  Exam: General - Patient is Alert and Oriented Extremity - Neurologically intact Neurovascular intact Sensation intact distally Dorsiflexion/Plantar flexion intact Dressing/Incision - clean, dry, no drainage Motor Function - intact, moving foot and toes well on exam.   Past Medical History:  Diagnosis Date   CKD (chronic kidney disease) stage 3, GFR 30-59 ml/min (HCC)    GERD (gastroesophageal reflux disease)    Hypertension    Pneumonia     Assessment/Plan: 2 Days Post-Op Procedure(s) (LRB): ARTHROPLASTY, KNEE, TOTAL (Left) Principal Problem:   OA  (osteoarthritis) of knee Active Problems:   Primary osteoarthritis of left knee  Estimated body mass index is 24.96 kg/m as calculated from the following:   Height as of this encounter: 5' 5 (1.651 m).   Weight as of this encounter: 68 kg. Up with therapy  DVT Prophylaxis - Aspirin  Weight-bearing as tolerated  Continue therapy today. UA checked yesterday, per patient's daughter hx of recurrent UTIs with confusion. This was negative, confusion most likely result of factors listed in first part of note. No confusion this morning, should be able to go home today.  Roxie Mess, PA-C Orthopedic Surgery [phone] 01/02/2024, 7:48 AM

## 2024-01-03 NOTE — Progress Notes (Signed)
 Physical Therapy Treatment Patient Details Name: Gina [name] MRN: [id number] DOB: 1932-12-30 Today's Date: 01/03/2024   History of Present Illness 89 yo female presents to therapy s/p L TKA on 12/31/2023 due to failure of conservative measures. Pt PMH includes but is not limited to: CKD III, SIRS, fall, hypotension, hypokalemia, GERD, HTN, R foot surgery, R TKA (2010) and R shoulder surgery.    PT Comments  Pt in bed, agreeable to session, c/o pain in L knee. Premedicated before session. Pt able to come to sit EOB, denies change in symptoms. Attempted lateral support Sit to Stand with RW and pt unable to attain standing position. Provided bilateral axillary support with RW to stand and able to complete with max A x1. Pt maintains standing position x30 seconds before returning to sitting. Pt unable to progress to amb at this time despite being in optimal window for pain meds. Nursing notified and will continue to follow up. Pt resting in supine and provided quad set and glute set in an attempt to assist with desensitization of the LLE. Dtr present and will assist with these. Pt unable to demonstrate safe mobility required for home setup at this time.    If plan is discharge home, recommend the following: A little help with walking and/or transfers;A little help with bathing/dressing/bathroom;Assistance with cooking/housework;Help with stairs or ramp for entrance;Assist for transportation   Can travel by private vehicle        Equipment Recommendations  None recommended by PT    Recommendations for Other Services       Precautions / Restrictions Precautions Precautions: Fall;Knee Recall of Precautions/Restrictions: Intact Restrictions Weight Bearing Restrictions Per Provider Order: Yes LLE Weight Bearing Per Provider Order: Weight bearing as tolerated     Mobility  Bed Mobility Overal bed mobility: Needs Assistance Bed Mobility: Supine to Sit     Supine to sit: Min assist      General bed mobility comments: requires inc time and help with scooting to EOB. Able to demonstrate movements and would be able to complete task with CGA and significant time.    Transfers Overall transfer level: Needs assistance Equipment used: Rolling walker (2 wheels) Transfers: Sit to/from Stand Sit to Stand: Max assist           General transfer comment: Pt requires max A from bilateral axillary support and RW to stand, reports 10/10 pain in knee despite being in optimal window for pain meds (premedicated). unable to progress amb at this time.    Ambulation/Gait                   Stairs             Wheelchair Mobility     Tilt Bed    Modified Rankin (Stroke Patients Only)       Balance Overall balance assessment: Needs assistance Sitting-balance support: Feet supported Sitting balance-Leahy Scale: Good     Standing balance support: Bilateral upper extremity supported, During functional activity, Reliant on assistive device for balance Standing balance-Leahy Scale: Poor                              Communication Communication Communication: Impaired Factors Affecting Communication: Hearing impaired  Cognition Arousal: Alert Behavior During Therapy: WFL for tasks assessed/performed   PT - Cognitive impairments: Memory, Orientation  PT - Cognition Comments: some mild confusion Following commands: Intact      Cueing Cueing Techniques: Verbal cues, Gestural cues  Exercises Total Joint Exercises Quad Sets: 15 reps, Supine, Right Gluteal Sets: AAROM, 15 reps, Right, Supine    General Comments        Pertinent Vitals/Pain Pain Assessment Pain Assessment: 0-10 Pain Score: 10-Worst pain ever Pain Location: L knee/thigh Pain Descriptors / Indicators: Aching, Constant, Operative site guarding, Discomfort, Grimacing Pain Intervention(s): Limited activity within patient's tolerance, Monitored  during session, Repositioned, Premedicated before session    Home Living                          Prior Function            PT Goals (current goals can now be found in the care plan section) Acute Rehab PT Goals Patient Stated Goal: to be able to move around better PT Goal Formulation: With patient Time For Goal Achievement: 01/14/24 Potential to Achieve Goals: Good Progress towards PT goals: Not progressing toward goals - comment (limited by inc pain this session despite premedication)    Frequency    7X/week      PT Plan      Co-evaluation              AM-PAC PT 6 Clicks Mobility   Outcome Measure  Help needed turning from your back to your side while in a flat bed without using bedrails?: A Little Help needed moving from lying on your back to sitting on the side of a flat bed without using bedrails?: A Little Help needed moving to and from a bed to a chair (including a wheelchair)?: A Lot Help needed standing up from a chair using your arms (e.g., wheelchair or bedside chair)?: A Lot Help needed to walk in hospital room?: A Lot Help needed climbing 3-5 steps with a railing? : Total 6 Click Score: 13    End of Session Equipment Utilized During Treatment: Gait belt Activity Tolerance: Patient limited by pain Patient left: with family/visitor present;in bed;with call bell/phone within reach;with bed alarm set Nurse Communication: Mobility status PT Visit Diagnosis: Unsteadiness on feet (R26.81);Other abnormalities of gait and mobility (R26.89);Muscle weakness (generalized) (M62.81);Pain;Difficulty in walking, not elsewhere classified (R26.2) Pain - Right/Left: Left Pain - part of body: Leg;Knee     Time: 8799-8774 PT Time Calculation (min) (ACUTE ONLY): 25 min  Charges:    $Therapeutic Activity: 23-37 mins PT General Charges $$ ACUTE PT VISIT: 1 Visit                     Stann, PT Acute Rehabilitation Services Office: [phone] 01/03/2024    Stann Gina [name] 01/03/2024, 1:29 PM

## 2024-01-03 NOTE — Progress Notes (Signed)
   Subjective: 3 Days Post-Op Procedure(s) (LRB): ARTHROPLASTY, KNEE, TOTAL (Left) Patient reports pain as mild.   Patient seen in rounds for Dr. Melodi. Patient is in good spirits this morning, does report pain/soreness in the knee. No issues overnight.  Plan is to go Home after hospital stay.  Objective: Vital signs in last 24 hours: Temp:  [97.7 F (36.5 C)-97.8 F (36.6 C)] 97.7 F (36.5 C) (09/25 0453) Pulse Rate:  [65-76] 76 (09/25 0453) Resp:  [14-18] 14 (09/25 0453) BP: (111-121)/(41-53) 111/49 (09/25 0453) SpO2:  [97 %-99 %] 98 % (09/25 0453)  Intake/Output from previous day:  Intake/Output Summary (Last 24 hours) at 01/03/2024 1257 Last data filed at 01/03/2024 1030 Gross per 24 hour  Intake 240 ml  Output 750 ml  Net -510 ml    Intake/Output this shift: Total I/O In: 240 [P.O.:240] Out: 400 [Urine:400]  Labs: Recent Labs    01/01/24 0317 01/02/24 0321  HGB 12.4 11.5*   Recent Labs    01/01/24 0317 01/02/24 0321  WBC 10.5 10.9*  RBC 3.83* 3.55*  HCT 36.6 34.1*  PLT 166 160   Recent Labs    01/01/24 0317  NA 138  K 3.4*  CL 107  CO2 17*  BUN 20  CREATININE 1.12*  GLUCOSE 121*  CALCIUM 9.6   No results for input(s): LABPT, INR in the last 72 hours.  Exam: General - Patient is Alert and Oriented Extremity - Neurologically intact Neurovascular intact Sensation intact distally Dorsiflexion/Plantar flexion intact Dressing/Incision - clean, dry, no drainage Motor Function - intact, moving foot and toes well on exam.   Past Medical History:  Diagnosis Date   CKD (chronic kidney disease) stage 3, GFR 30-59 ml/min (HCC)    GERD (gastroesophageal reflux disease)    Hypertension    Pneumonia     Assessment/Plan: 3 Days Post-Op Procedure(s) (LRB): ARTHROPLASTY, KNEE, TOTAL (Left) Principal Problem:   OA (osteoarthritis) of knee Active Problems:   Primary osteoarthritis of left knee  Estimated body mass index is 24.96 kg/m as  calculated from the following:   Height as of this encounter: 5' 5 (1.651 m).   Weight as of this encounter: 68 kg. Up with therapy  DVT Prophylaxis - Aspirin  Weight-bearing as tolerated  Discharge to home today if cleared with PT  Roxie Mess, PA-C Orthopedic Surgery [phone] 01/03/2024, 12:57 PM

## 2024-01-04 ENCOUNTER — Other Ambulatory Visit (HOSPITAL_COMMUNITY): Payer: Self-pay

## 2024-01-04 LAB — GLUCOSE, CAPILLARY: Glucose-Capillary: 108 mg/dL — ABNORMAL HIGH (ref 70–99)

## 2024-01-04 NOTE — Progress Notes (Signed)
 Physical Therapy Treatment Patient Details Name: Gina [name] MRN: [id number] DOB: 04-22-1932 Today's Date: 01/04/2024   History of Present Illness 89 yo female presents to therapy s/p L TKA on 12/31/2023 due to failure of conservative measures. Pt PMH includes but is not limited to: CKD III, SIRS, fall, hypotension, hypokalemia, GERD, HTN, R foot surgery, R TKA (2010) and R shoulder surgery.    PT Comments  Pt ambulated 23' with RW with encouragement, distance limited by pain and fatigue (pt premedicated for pain). Reviewed TKA HEP, pt requires assistance to perform HEP correctly. She is not progressing well with mobility and is not ready to DC home today.     If plan is discharge home, recommend the following: A little help with walking and/or transfers;A little help with bathing/dressing/bathroom;Assistance with cooking/housework;Help with stairs or ramp for entrance;Assist for transportation   Can travel by private vehicle        Equipment Recommendations  None recommended by PT    Recommendations for Other Services       Precautions / Restrictions Precautions Precautions: Fall;Knee Precaution Booklet Issued: Yes (comment) Recall of Precautions/Restrictions: Intact Precaution/Restrictions Comments: reviewed no pillow under knee Restrictions Weight Bearing Restrictions Per Provider Order: No LLE Weight Bearing Per Provider Order: Weight bearing as tolerated     Mobility  Bed Mobility Overal bed mobility: Needs Assistance Bed Mobility: Supine to Sit     Supine to sit: Mod assist     General bed mobility comments: up in recliner    Transfers Overall transfer level: Needs assistance Equipment used: Rolling walker (2 wheels) Transfers: Sit to/from Stand Sit to Stand: From elevated surface, Max assist           General transfer comment: max assist to stand from recliner, verbal/manual cues for hand placement    Ambulation/Gait Ambulation/Gait assistance:  Contact guard assist Gait Distance (Feet): 18 Feet Assistive device: Rolling walker (2 wheels) Gait Pattern/deviations: Antalgic, Decreased stance time - left, Step-to pattern Gait velocity: decreased     General Gait Details: distance limited by pain & fatigue   Stairs             Wheelchair Mobility     Tilt Bed    Modified Rankin (Stroke Patients Only)       Balance Overall balance assessment: Needs assistance Sitting-balance support: Feet supported Sitting balance-Leahy Scale: Good     Standing balance support: Bilateral upper extremity supported, During functional activity, Reliant on assistive device for balance Standing balance-Leahy Scale: Poor                              Communication Communication Communication: Impaired Factors Affecting Communication: Hearing impaired  Cognition Arousal: Alert Behavior During Therapy: WFL for tasks assessed/performed   PT - Cognitive impairments: Memory, Orientation                       PT - Cognition Comments: some mild confusion Following commands: Intact      Cueing Cueing Techniques: Verbal cues, Gestural cues  Exercises Total Joint Exercises Ankle Circles/Pumps: AROM, Both, 10 reps, 15 reps, Supine Short Arc Quad: AAROM, Left, 10 reps, Supine Heel Slides: AAROM, Left, AROM, 15 reps Hip ABduction/ADduction: AAROM, Left, 10 reps, Supine Straight Leg Raises: AAROM, Left, 10 reps, Supine Long Arc Quad: AROM, Left, 10 reps, Seated Knee Flexion: AAROM, Left, 10 reps, Seated Goniometric ROM: ~5-70* AAROM L knee  General Comments        Pertinent Vitals/Pain Pain Assessment Pain Score: 4  Faces Pain Scale: Hurts even more Pain Location: L knee/thigh Pain Descriptors / Indicators: Operative site guarding, Grimacing Pain Intervention(s): Limited activity within patient's tolerance, Monitored during session, Premedicated before session, Repositioned, Ice applied    Home Living                           Prior Function            PT Goals (current goals can now be found in the care plan section) Acute Rehab PT Goals Patient Stated Goal: to be able to move around better PT Goal Formulation: With patient Time For Goal Achievement: 01/14/24 Potential to Achieve Goals: Good Progress towards PT goals: Not progressing toward goals - comment (pain/fatigue limiting progress)    Frequency    7X/week      PT Plan      Co-evaluation              AM-PAC PT 6 Clicks Mobility   Outcome Measure  Help needed turning from your back to your side while in a flat bed without using bedrails?: A Little Help needed moving from lying on your back to sitting on the side of a flat bed without using bedrails?: A Lot Help needed moving to and from a bed to a chair (including a wheelchair)?: A Little Help needed standing up from a chair using your arms (e.g., wheelchair or bedside chair)?: A Lot Help needed to walk in hospital room?: A Little Help needed climbing 3-5 steps with a railing? : A Lot 6 Click Score: 15    End of Session Equipment Utilized During Treatment: Gait belt Activity Tolerance: Patient limited by pain Patient left: in chair;with chair alarm set;with call bell/phone within reach Nurse Communication: Mobility status;Other (comment) (pt's TED hose and socks are wet with urine, needs new purewick) PT Visit Diagnosis: Unsteadiness on feet (R26.81);Other abnormalities of gait and mobility (R26.89);Muscle weakness (generalized) (M62.81);Pain;Difficulty in walking, not elsewhere classified (R26.2) Pain - Right/Left: Left Pain - part of body: Knee     Time: 1255-1317 PT Time Calculation (min) (ACUTE ONLY): 22 min  Charges:    $Gait Training: 8-22 mins PT General Charges $$ ACUTE PT VISIT: 1 Visit                     Sylvan Delon Copp PT 01/04/2024  Acute Rehabilitation Services  Office [phone]

## 2024-01-04 NOTE — Plan of Care (Signed)
  Problem: Pain Managment: Goal: General experience of comfort will improve and/or be controlled Outcome: Progressing   Problem: Safety: Goal: Ability to remain free from injury will improve Outcome: Progressing

## 2024-01-04 NOTE — Progress Notes (Signed)
   Subjective: 4 Days Post-Op Procedure(s) (LRB): ARTHROPLASTY, KNEE, TOTAL (Left) Patient reports pain as mild.   Patient seen in rounds for Dr. Melodi. Patient is well, and has had no acute complaints or problems. Was not cleared with therapy yesterday. States the knee feels better this morning.  Plan is to go Home after hospital stay.  Objective: Vital signs in last 24 hours: Temp:  [97.7 F (36.5 C)-98 F (36.7 C)] 98 F (36.7 C) (09/26 0538) Pulse Rate:  [67-79] 67 (09/26 0538) Resp:  [16-22] 16 (09/26 0538) BP: (112-132)/(49-58) 132/55 (09/26 0538) SpO2:  [97 %-99 %] 98 % (09/26 0538)  Intake/Output from previous day:  Intake/Output Summary (Last 24 hours) at 01/04/2024 0751 Last data filed at 01/04/2024 0538 Gross per 24 hour  Intake 460 ml  Output 1000 ml  Net -540 ml    Intake/Output this shift: No intake/output data recorded.  Labs: Recent Labs    01/02/24 0321  HGB 11.5*   Recent Labs    01/02/24 0321  WBC 10.9*  RBC 3.55*  HCT 34.1*  PLT 160   No results for input(s): NA, K, CL, CO2, BUN, CREATININE, GLUCOSE, CALCIUM in the last 72 hours. No results for input(s): LABPT, INR in the last 72 hours.  Exam: General - Patient is Alert and Oriented Extremity - Neurologically intact Neurovascular intact Sensation intact distally Dorsiflexion/Plantar flexion intact Dressing/Incision - clean, dry, no drainage Motor Function - intact, moving foot and toes well on exam.   Past Medical History:  Diagnosis Date   CKD (chronic kidney disease) stage 3, GFR 30-59 ml/min (HCC)    GERD (gastroesophageal reflux disease)    Hypertension    Pneumonia     Assessment/Plan: 4 Days Post-Op Procedure(s) (LRB): ARTHROPLASTY, KNEE, TOTAL (Left) Principal Problem:   OA (osteoarthritis) of knee Active Problems:   Primary osteoarthritis of left knee  Estimated body mass index is 24.96 kg/m as calculated from the following:   Height as of this  encounter: 5' 5 (1.651 m).   Weight as of this encounter: 68 kg. Up with therapy  DVT Prophylaxis - Aspirin  Weight-bearing as tolerated  Discharge to home once cleared with therapy.  Roxie Mess, PA-C Orthopedic Surgery [phone] 01/04/2024, 7:51 AM

## 2024-01-04 NOTE — Progress Notes (Signed)
 Physical Therapy Treatment Patient Details Name: Gina [name] MRN: [id number] DOB: 17-Apr-1932 Today's Date: 01/04/2024   History of Present Illness 89 yo female presents to therapy s/p L TKA on 12/31/2023 due to failure of conservative measures. Pt PMH includes but is not limited to: CKD III, SIRS, fall, hypotension, hypokalemia, GERD, HTN, R foot surgery, R TKA (2010) and R shoulder surgery.    PT Comments  Pt reports it hurts so bad with all mobility, pt had pain medication prior to PT session. She required mod assist for supine to sit and sit to stand. With encouragement she ambulated 44' with RW, distance limited by pain. Pt performed TKA HEP with assistance. She's not yet ready to DC home from a mobility standpoint.    If plan is discharge home, recommend the following: A little help with walking and/or transfers;A little help with bathing/dressing/bathroom;Assistance with cooking/housework;Help with stairs or ramp for entrance;Assist for transportation   Can travel by private vehicle        Equipment Recommendations  None recommended by PT    Recommendations for Other Services       Precautions / Restrictions Precautions Precautions: Fall;Knee; reviewed no pillow under knee Recall of Precautions/Restrictions: Intact Restrictions Weight Bearing Restrictions Per Provider Order: No LLE Weight Bearing Per Provider Order: Weight bearing as tolerated     Mobility  Bed Mobility Overal bed mobility: Needs Assistance Bed Mobility: Supine to Sit     Supine to sit: Mod assist     General bed mobility comments: assist to raise trunk and pivot hips with pad, increased time    Transfers Overall transfer level: Needs assistance Equipment used: Rolling walker (2 wheels) Transfers: Sit to/from Stand Sit to Stand: Mod assist, From elevated surface           General transfer comment: MOd A for STS from elevated bed, verbal/manual cues for hand placement     Ambulation/Gait Ambulation/Gait assistance: Contact guard assist Gait Distance (Feet): 14 Feet Assistive device: Rolling walker (2 wheels) Gait Pattern/deviations: Antalgic, Decreased stance time - left, Step-to pattern Gait velocity: decreased     General Gait Details: distance limited by pain   Stairs             Wheelchair Mobility     Tilt Bed    Modified Rankin (Stroke Patients Only)       Balance Overall balance assessment: Needs assistance Sitting-balance support: Feet supported Sitting balance-Leahy Scale: Good     Standing balance support: Bilateral upper extremity supported, During functional activity, Reliant on assistive device for balance Standing balance-Leahy Scale: Poor                              Communication Communication Communication: Impaired Factors Affecting Communication: Hearing impaired  Cognition Arousal: Alert Behavior During Therapy: WFL for tasks assessed/performed   PT - Cognitive impairments: Memory, Orientation                       PT - Cognition Comments: some mild confusion Following commands: Intact      Cueing Cueing Techniques: Verbal cues, Gestural cues  Exercises Total Joint Exercises Ankle Circles/Pumps: AROM, Both, 10 reps, 15 reps, Supine Short Arc Quad: AAROM, Left, 10 reps, Supine Heel Slides: AAROM, Left, 10 reps, AROM Long Arc Quad: AROM, Left, 10 reps, Seated Goniometric ROM: ~10-65* AAROM L knee    General Comments  Pertinent Vitals/Pain Pain Assessment Pain Score: 4  Pain Location: L knee/thigh Pain Descriptors / Indicators: Aching, Constant, Operative site guarding, Discomfort, Grimacing Pain Intervention(s): Limited activity within patient's tolerance, Monitored during session, Premedicated before session, Repositioned, Ice applied    Home Living                          Prior Function            PT Goals (current goals can now be found in  the care plan section) Acute Rehab PT Goals Patient Stated Goal: to be able to move around better PT Goal Formulation: With patient Time For Goal Achievement: 01/14/24 Potential to Achieve Goals: Good Progress towards PT goals: Not progressing toward goals - comment (pain limiting activity tolerance)    Frequency    7X/week      PT Plan      Co-evaluation              AM-PAC PT 6 Clicks Mobility   Outcome Measure  Help needed turning from your back to your side while in a flat bed without using bedrails?: A Little Help needed moving from lying on your back to sitting on the side of a flat bed without using bedrails?: A Lot Help needed moving to and from a bed to a chair (including a wheelchair)?: A Little Help needed standing up from a chair using your arms (e.g., wheelchair or bedside chair)?: A Lot Help needed to walk in hospital room?: A Little Help needed climbing 3-5 steps with a railing? : A Lot 6 Click Score: 15    End of Session Equipment Utilized During Treatment: Gait belt Activity Tolerance: Patient limited by pain Patient left: in chair;with chair alarm set;with call bell/phone within reach;with nursing/sitter in room Nurse Communication: Mobility status;Other (comment) (bed wet with urine, needs new purewick) PT Visit Diagnosis: Unsteadiness on feet (R26.81);Other abnormalities of gait and mobility (R26.89);Muscle weakness (generalized) (M62.81);Pain;Difficulty in walking, not elsewhere classified (R26.2) Pain - Right/Left: Left Pain - part of body: Knee     Time: 9146-9083 PT Time Calculation (min) (ACUTE ONLY): 23 min  Charges:    $Gait Training: 8-22 mins $Therapeutic Exercise: 8-22 mins PT General Charges $$ ACUTE PT VISIT: 1 Visit                     Gina [name] PT 01/04/2024  Acute Rehabilitation Services  Office [phone]

## 2024-01-05 NOTE — Plan of Care (Signed)
  Problem: Pain Managment: Goal: General experience of comfort will improve and/or be controlled Outcome: Progressing   Problem: Safety: Goal: Ability to remain free from injury will improve Outcome: Progressing

## 2024-01-05 NOTE — Progress Notes (Signed)
    Subjective: Patient seen in rounds for Dr. Melodi Patient reports pain as mild.  Denies N/V/CP/SOB/Abd pain.  She reports her pain is controlled with the medications. She was not cleared by PT yesterday, slow progression.  Plan to d/c home.   Objective:   VITALS:   Vitals:   01/04/24 0538 01/04/24 1357 01/04/24 2107 01/05/24 0646  BP: (!) 132/55 (!) 118/50 (!) 113/58 (!) 117/56  Pulse: 67 82 83 69  Resp: 16 16 15 15   Temp: 98 F (36.7 C) 98 F (36.7 C) 98.3 F (36.8 C) 98 F (36.7 C)  TempSrc:  Oral Oral Oral  SpO2: 98% 97% 96% 97%  Weight:      Height:        NAD, she is alert and oriented.  Neurologically intact ABD soft Neurovascular intact Sensation intact distally Intact pulses distally Dorsiflexion/Plantar flexion intact Incision: dressing C/D/I No cellulitis present Compartment soft   Lab Results  Component Value Date   WBC 10.9 (H) 01/02/2024   HGB 11.5 (L) 01/02/2024   HCT 34.1 (L) 01/02/2024   MCV 96.1 01/02/2024   PLT 160 01/02/2024   BMET    Component Value Date/Time   NA 138 01/01/2024 0317   K 3.4 (L) 01/01/2024 0317   CL 107 01/01/2024 0317   CO2 17 (L) 01/01/2024 0317   GLUCOSE 121 (H) 01/01/2024 0317   BUN 20 01/01/2024 0317   CREATININE 1.12 (H) 01/01/2024 0317   CALCIUM 9.6 01/01/2024 0317   GFRNONAA 47 (L) 01/01/2024 0317     Assessment/Plan: 5 Days Post-Op   Principal Problem:   OA (osteoarthritis) of knee Active Problems:   Primary osteoarthritis of left knee   WBAT with walker DVT ppx: Aspirin , SCDs, TEDS PO pain control PT/OT: Slow progression with PT. 14 and 18 feet yesterday. Continue today.  Dispo:  - D/c home once cleared with PT.    Valery GORMAN Potters 01/05/2024, 9:42 AM   [email or web address]  [place] [address], [address], [place], [place] [address] Phone: [phone] [email or web address] Facebook  [employer]

## 2024-01-05 NOTE — Progress Notes (Signed)
 Physical Therapy Treatment Patient Details Name: Gina [name] MRN: [id number] DOB: [birth year] Today's Date: 01/05/2024   History of Present Illness 89 yo female presents to therapy s/p L TKA on 12/31/2023 due to failure of conservative measures. Pt PMH includes but is not limited to: CKD III, SIRS, fall, hypotension, hypokalemia, GERD, HTN, R foot surgery, R TKA (2010) and R shoulder surgery.    PT Comments  Pt reports pain has been limiting her progress but she is ready to participate.  Pt assisted with ambulating however continues to only tolerate short distances.  Pt agreeable to remain OOB in recliner for lunch.    If plan is discharge home, recommend the following: A little help with walking and/or transfers;A little help with bathing/dressing/bathroom;Assistance with cooking/housework;Help with stairs or ramp for entrance;Assist for transportation   Can travel by private vehicle        Equipment Recommendations  None recommended by PT    Recommendations for Other Services       Precautions / Restrictions Precautions Precautions: Fall;Knee Restrictions LLE Weight Bearing Per Provider Order: Weight bearing as tolerated     Mobility  Bed Mobility Overal bed mobility: Needs Assistance Bed Mobility: Supine to Sit     Supine to sit: HOB elevated, Min assist     General bed mobility comments: verbal cues for technique, assist to scoot Lt side to EOB    Transfers Overall transfer level: Needs assistance Equipment used: Rolling walker (2 wheels) Transfers: Sit to/from Stand Sit to Stand: Min assist           General transfer comment: multimodal cues for UE and LE positioning; min assist for rise, stabilize and control descent;    Ambulation/Gait Ambulation/Gait assistance: Contact guard assist Gait Distance (Feet): 25 Feet Assistive device: Rolling walker (2 wheels) Gait Pattern/deviations: Antalgic, Decreased stance time - left, Step-to pattern Gait velocity:  decreased     General Gait Details: verbal cues for sequence, RW positioning, step length, posture; pt reports pain limiting distance   Stairs             Wheelchair Mobility     Tilt Bed    Modified Rankin (Stroke Patients Only)       Balance                                            Communication Communication Communication: Impaired Factors Affecting Communication: Hearing impaired  Cognition Arousal: Alert Behavior During Therapy: WFL for tasks assessed/performed                             Following commands: Intact      Cueing Cueing Techniques: Verbal cues, Tactile cues, Visual cues  Exercises Total Joint Exercises Ankle Circles/Pumps: AROM, Both, 10 reps Quad Sets: AROM, Both, 10 reps Heel Slides: AAROM, Left, 10 reps, Seated    General Comments        Pertinent Vitals/Pain Pain Assessment Pain Assessment: 0-10 Pain Score: 5  Pain Location: L knee/thigh Pain Descriptors / Indicators: Operative site guarding, Grimacing, Aching, Sore Pain Intervention(s): Monitored during session, Repositioned, Premedicated before session    Home Living                          Prior Function  PT Goals (current goals can now be found in the care plan section) Progress towards PT goals: Not progressing toward goals - comment (pain/fatigue limiting)    Frequency    7X/week      PT Plan      Co-evaluation              AM-PAC PT 6 Clicks Mobility   Outcome Measure  Help needed turning from your back to your side while in a flat bed without using bedrails?: A Little Help needed moving from lying on your back to sitting on the side of a flat bed without using bedrails?: A Lot Help needed moving to and from a bed to a chair (including a wheelchair)?: A Lot Help needed standing up from a chair using your arms (e.g., wheelchair or bedside chair)?: A Lot Help needed to walk in hospital room?:  A Lot Help needed climbing 3-5 steps with a railing? : A Lot 6 Click Score: 13    End of Session Equipment Utilized During Treatment: Gait belt Activity Tolerance: Patient limited by pain;Patient limited by fatigue Patient left: in chair;with call bell/phone within reach;with chair alarm set Nurse Communication: Mobility status PT Visit Diagnosis: Difficulty in walking, not elsewhere classified (R26.2);Pain Pain - Right/Left: Left Pain - part of body: Knee     Time: 1146-1207 PT Time Calculation (min) (ACUTE ONLY): 21 min  Charges:    $Gait Training: 8-22 mins PT General Charges $$ ACUTE PT VISIT: 1 Visit                     Tari KLEIN, DPT Physical Therapist Acute Rehabilitation Services Office: [phone]  Tari CROME Payson 01/05/2024, 4:15 PM

## 2024-01-05 NOTE — Progress Notes (Signed)
 Physical Therapy Treatment Patient Details Name: Gina [name] MRN: [id number] DOB: [birth year] Today's Date: 01/05/2024   History of Present Illness 89 yo female presents to therapy s/p L TKA on 12/31/2023 due to failure of conservative measures. Pt PMH includes but is not limited to: CKD III, SIRS, fall, hypotension, hypokalemia, GERD, HTN, R foot surgery, R TKA (2010) and R shoulder surgery.    PT Comments  Pt appears confused this afternoon.  She reports more stiffness in pain in recliner and encouraged to mobilize.  Upon standing, pt felt she was in too much pain and requested immediate return to bed. Pt safely assisted back to bed.  Pt reports she will go home with assist from mom and grandson however then reports both cannot physically assist as one fell and hurt their hip and the other has 2 kids to take care of (?).  Per previous notes, pt's plan was to d/c home with daughter.  No family present today during sessions and RN does not report visitors however pt said she had family come see her today.       If plan is discharge home, recommend the following: Assistance with cooking/housework;Help with stairs or ramp for entrance;Assist for transportation;A lot of help with walking and/or transfers;A lot of help with bathing/dressing/bathroom   Can travel by private vehicle        Equipment Recommendations  None recommended by PT    Recommendations for Other Services       Precautions / Restrictions Precautions Precautions: Fall;Knee Restrictions LLE Weight Bearing Per Provider Order: Weight bearing as tolerated     Mobility  Bed Mobility Overal bed mobility: Needs Assistance Bed Mobility: Sit to Supine     Supine to sit: HOB elevated, Min assist Sit to supine: Mod assist   General bed mobility comments: verbal cues for technique, assist for LEs onto bed due to fatigue and pain    Transfers Overall transfer level: Needs assistance Equipment used: Rolling walker (2  wheels) Transfers: Sit to/from Stand, Bed to chair/wheelchair/BSC Sit to Stand: Mod assist, +2 safety/equipment   Step pivot transfers: Mod assist, +2 safety/equipment       General transfer comment: multimodal cues for UE and LE positioning; mod assist for rise, stabilize and control descent; pt reporting more pain this afternoon    Ambulation/Gait   Stairs             Wheelchair Mobility     Tilt Bed    Modified Rankin (Stroke Patients Only)       Balance                                            Communication Communication Communication: Impaired Factors Affecting Communication: Hearing impaired  Cognition Arousal: Alert Behavior During Therapy: WFL for tasks assessed/performed   PT - Cognitive impairments: Memory                       PT - Cognition Comments: some confusion this afternoon observed, RN notified Following commands: Intact      Cueing Cueing Techniques: Verbal cues, Tactile cues, Visual cues  Exercises Total Joint Exercises Ankle Circles/Pumps: AROM, Both, 10 reps Quad Sets: AROM, Both, 10 reps Heel Slides: AAROM, Left, 10 reps, Seated    General Comments        Pertinent Vitals/Pain Pain Assessment Pain Assessment:  0-10 Pain Score: 8  Pain Location: L knee/thigh Pain Descriptors / Indicators: Operative site guarding, Grimacing, Aching, Sore Pain Intervention(s): Repositioned, Monitored during session, Patient requesting pain meds-RN notified    Home Living                          Prior Function            PT Goals (current goals can now be found in the care plan section) Progress towards PT goals: Not progressing toward goals - comment (pain/fatigue limiting)    Frequency    7X/week      PT Plan      Co-evaluation              AM-PAC PT 6 Clicks Mobility   Outcome Measure  Help needed turning from your back to your side while in a flat bed without using  bedrails?: A Lot Help needed moving from lying on your back to sitting on the side of a flat bed without using bedrails?: A Lot Help needed moving to and from a bed to a chair (including a wheelchair)?: A Lot Help needed standing up from a chair using your arms (e.g., wheelchair or bedside chair)?: A Lot Help needed to walk in hospital room?: Total Help needed climbing 3-5 steps with a railing? : Total 6 Click Score: 10    End of Session Equipment Utilized During Treatment: Gait belt Activity Tolerance: Patient limited by pain;Patient limited by fatigue Patient left: with call bell/phone within reach;in bed;with bed alarm set;Other (comment) (telesitter in room today) Nurse Communication: Mobility status PT Visit Diagnosis: Difficulty in walking, not elsewhere classified (R26.2);Pain Pain - Right/Left: Left Pain - part of body: Knee     Time: 8494-8479 PT Time Calculation (min) (ACUTE ONLY): 15 min  Charges:     $Therapeutic Activity: 8-22 mins PT General Charges $$ ACUTE PT VISIT: 1 Visit                    Tari KLEIN, DPT Physical Therapist Acute Rehabilitation Services Office: [phone]    Tari CROME P[place] 01/05/2024, 4:32 PM

## 2024-01-06 NOTE — Progress Notes (Signed)
 Physical Therapy Treatment Patient Details Name: Gina [name] MRN: [id number] DOB: 1932/10/04 Today's Date: 01/06/2024   History of Present Illness 89 yo female presents to therapy s/p L TKA on 12/31/2023 due to failure of conservative measures. Pt PMH includes but is not limited to: CKD III, SIRS, fall, hypotension, hypokalemia, GERD, HTN, R foot surgery, R TKA (2010) and R shoulder surgery.    PT Comments  Pt eager to be OOB this morning. Pt assisted with ambulating however reports increased pain and unable to tolerate increase in distance (only able to perform the same as yesterday).      If plan is discharge home, recommend the following: Assistance with cooking/housework;Help with stairs or ramp for entrance;Assist for transportation;A little help with walking and/or transfers;A little help with bathing/dressing/bathroom   Can travel by private vehicle        Equipment Recommendations  None recommended by PT    Recommendations for Other Services       Precautions / Restrictions Precautions Precautions: Fall;Knee Restrictions LLE Weight Bearing Per Provider Order: Weight bearing as tolerated     Mobility  Bed Mobility Overal bed mobility: Needs Assistance Bed Mobility: Supine to Sit     Supine to sit: Contact guard, HOB elevated, Used rails     General bed mobility comments: verbal cues for technique    Transfers Overall transfer level: Needs assistance Equipment used: Rolling walker (2 wheels) Transfers: Sit to/from Stand Sit to Stand: Contact guard assist, From elevated surface           General transfer comment: multimodal cues for UE and LE positioning    Ambulation/Gait Ambulation/Gait assistance: Contact guard assist Gait Distance (Feet): 25 Feet Assistive device: Rolling walker (2 wheels) Gait Pattern/deviations: Antalgic, Decreased stance time - left, Step-to pattern Gait velocity: decreased     General Gait Details: verbal cues for sequence,  RW positioning, step length, posture; pt reports pain limiting distance   Stairs             Wheelchair Mobility     Tilt Bed    Modified Rankin (Stroke Patients Only)       Balance                                            Communication Communication Communication: Impaired Factors Affecting Communication: Hearing impaired  Cognition Arousal: Alert Behavior During Therapy: WFL for tasks assessed/performed                             Following commands: Intact      Cueing Cueing Techniques: Verbal cues, Tactile cues, Visual cues  Exercises      General Comments        Pertinent Vitals/Pain Pain Assessment Pain Assessment: 0-10 Pain Score: 7  Pain Location: L knee/thigh Pain Descriptors / Indicators: Operative site guarding, Grimacing, Aching, Sore Pain Intervention(s): Repositioned, Monitored during session    Home Living                          Prior Function            PT Goals (current goals can now be found in the care plan section) Progress towards PT goals: Progressing toward goals    Frequency    7X/week  PT Plan      Co-evaluation              AM-PAC PT 6 Clicks Mobility   Outcome Measure  Help needed turning from your back to your side while in a flat bed without using bedrails?: A Little Help needed moving from lying on your back to sitting on the side of a flat bed without using bedrails?: A Little Help needed moving to and from a bed to a chair (including a wheelchair)?: A Little Help needed standing up from a chair using your arms (e.g., wheelchair or bedside chair)?: A Little Help needed to walk in hospital room?: A Little Help needed climbing 3-5 steps with a railing? : A Lot 6 Click Score: 17    End of Session Equipment Utilized During Treatment: Gait belt Activity Tolerance: Patient limited by pain;Patient limited by fatigue Patient left: with call bell/phone  within reach;in chair;with chair alarm set;Other (comment) (telesitter in room)   PT Visit Diagnosis: Difficulty in walking, not elsewhere classified (R26.2);Pain     Time: 8861-8850 PT Time Calculation (min) (ACUTE ONLY): 11 min  Charges:    $Gait Training: 8-22 mins PT General Charges $$ ACUTE PT VISIT: 1 Visit                     Tari KLEIN, DPT Physical Therapist Acute Rehabilitation Services Office: [phone]    Kati L Payson 01/06/2024, 1:02 PM

## 2024-01-06 NOTE — Progress Notes (Signed)
 Physical Therapy Treatment Patient Details Name: Gina [name] MRN: [id number] DOB: 12/07/1932 Today's Date: 01/06/2024   History of Present Illness 89 yo female presents to therapy s/p L TKA on 12/31/2023 due to failure of conservative measures. Pt PMH includes but is not limited to: CKD III, SIRS, fall, hypotension, hypokalemia, GERD, HTN, R foot surgery, R TKA (2010) and R shoulder surgery.    PT Comments  Pt able to ambulate short distance again this afternoon with encouragement.  Pt continues to reports left knee pain however mobility appears to be improving as pt requiring less assist today.  Requested pt notify daughter to be present in the morning if able for stair training as pt reports a couple steps to enter daughter's home.  Pt states daughter had visited her last night and plans to come again later today/tonight (so RN also notified of PT request).     If plan is discharge home, recommend the following: Assistance with cooking/housework;Help with stairs or ramp for entrance;Assist for transportation;A little help with walking and/or transfers;A little help with bathing/dressing/bathroom   Can travel by private vehicle        Equipment Recommendations  None recommended by PT    Recommendations for Other Services       Precautions / Restrictions Precautions Precautions: Fall;Knee Restrictions LLE Weight Bearing Per Provider Order: Weight bearing as tolerated     Mobility  Bed Mobility Overal bed mobility: Needs Assistance Bed Mobility: Sit to Supine     Supine to sit: Contact guard, HOB elevated, Used rails Sit to supine: Min assist   General bed mobility comments: verbal cues for technique, light assist for L LE onto bed    Transfers Overall transfer level: Needs assistance Equipment used: Rolling walker (2 wheels) Transfers: Sit to/from Stand Sit to Stand: Contact guard assist           General transfer comment: multimodal cues for UE and LE  positioning, increased time and effort however pt able to to rise with CGA, tends to have uncontrolled descent to sit    Ambulation/Gait Ambulation/Gait assistance: Contact guard assist Gait Distance (Feet): 25 Feet Assistive device: Rolling walker (2 wheels) Gait Pattern/deviations: Antalgic, Decreased stance time - left, Step-to pattern Gait velocity: decreased     General Gait Details: verbal cues for sequence, RW positioning, step length, posture; pt reports pain limiting distance   Stairs             Wheelchair Mobility     Tilt Bed    Modified Rankin (Stroke Patients Only)       Balance                                            Communication Communication Communication: Impaired Factors Affecting Communication: Hearing impaired  Cognition Arousal: Alert Behavior During Therapy: WFL for tasks assessed/performed                             Following commands: Intact      Cueing Cueing Techniques: Verbal cues, Tactile cues, Visual cues  Exercises      General Comments        Pertinent Vitals/Pain Pain Assessment Pain Assessment: 0-10 Pain Score: 7  Pain Location: L knee/thigh Pain Descriptors / Indicators: Operative site guarding, Grimacing, Aching, Sore Pain Intervention(s): Monitored during session, Repositioned  Home Living                          Prior Function            PT Goals (current goals can now be found in the care plan section) Progress towards PT goals: Progressing toward goals    Frequency    7X/week      PT Plan      Co-evaluation              AM-PAC PT 6 Clicks Mobility   Outcome Measure  Help needed turning from your back to your side while in a flat bed without using bedrails?: A Little Help needed moving from lying on your back to sitting on the side of a flat bed without using bedrails?: A Little Help needed moving to and from a bed to a chair  (including a wheelchair)?: A Little Help needed standing up from a chair using your arms (e.g., wheelchair or bedside chair)?: A Little Help needed to walk in hospital room?: A Little Help needed climbing 3-5 steps with a railing? : A Lot 6 Click Score: 17    End of Session Equipment Utilized During Treatment: Gait belt Activity Tolerance: Patient limited by pain;Patient limited by fatigue Patient left: in bed;with call bell/phone within reach;with bed alarm set   PT Visit Diagnosis: Difficulty in walking, not elsewhere classified (R26.2);Pain Pain - Right/Left: Left Pain - part of body: Knee     Time: 8595-8579 PT Time Calculation (min) (ACUTE ONLY): 16 min  Charges:    $Gait Training: 8-22 mins PT General Charges $$ ACUTE PT VISIT: 1 Visit                    Tari KLEIN, DPT Physical Therapist Acute Rehabilitation Services Office: [phone]   Tari CROME [place] 01/06/2024, 4:04 PM

## 2024-01-06 NOTE — Progress Notes (Signed)
 Orthopedics Progress Note  Subjective: Patient reports being cold but pain controlled  Objective:  Vitals:   01/05/24 2048 01/06/24 0519  BP: (!) 126/52 (!) 116/48  Pulse: 89 66  Resp: 16 17  Temp: 98 F (36.7 C) 98 F (36.7 C)  SpO2: 97% 96%    General: Awake and alert  Musculoskeletal: Left knee dressing CDI, good quad set and flexion to 70 degrees in bed no problem. Neg Homans Neurovascularly intact  Lab Results  Component Value Date   WBC 10.9 (H) 01/02/2024   HGB 11.5 (L) 01/02/2024   HCT 34.1 (L) 01/02/2024   MCV 96.1 01/02/2024   PLT 160 01/02/2024       Component Value Date/Time   NA 138 01/01/2024 0317   K 3.4 (L) 01/01/2024 0317   CL 107 01/01/2024 0317   CO2 17 (L) 01/01/2024 0317   GLUCOSE 121 (H) 01/01/2024 0317   BUN 20 01/01/2024 0317   CREATININE 1.12 (H) 01/01/2024 0317   CALCIUM 9.6 01/01/2024 0317   GFRNONAA 47 (L) 01/01/2024 0317   GFRAA  07/22/2010 0414    >60        The eGFR has been calculated using the MDRD equation. This calculation has not been validated in all clinical situations. eGFR's persistently <60 mL/min signify possible Chronic Kidney Disease.    Lab Results  Component Value Date   INR 1.03 07/11/2010    Assessment/Plan: S/p ARTHROPLASTY, KNEE, TOTAL Slow progress with PT. She is alert this AM. Hopefully she can do better with therapy today.  Discharge once she clears PT  Elspeth SAUNDERS. Kay, MD 01/06/2024 7:11 AM

## 2024-01-06 NOTE — Plan of Care (Signed)

## 2024-01-07 ENCOUNTER — Other Ambulatory Visit (HOSPITAL_COMMUNITY): Payer: Self-pay

## 2024-01-07 DIAGNOSIS — K219 Gastro-esophageal reflux disease without esophagitis: Secondary | ICD-10-CM | POA: Diagnosis present

## 2024-01-07 DIAGNOSIS — Z8701 Personal history of pneumonia (recurrent): Secondary | ICD-10-CM | POA: Diagnosis not present

## 2024-01-07 DIAGNOSIS — Z91018 Allergy to other foods: Secondary | ICD-10-CM | POA: Diagnosis not present

## 2024-01-07 DIAGNOSIS — Z83518 Family history of other specified eye disorder: Secondary | ICD-10-CM | POA: Diagnosis not present

## 2024-01-07 DIAGNOSIS — N183 Chronic kidney disease, stage 3 unspecified: Secondary | ICD-10-CM | POA: Diagnosis present

## 2024-01-07 DIAGNOSIS — Z89411 Acquired absence of right great toe: Secondary | ICD-10-CM | POA: Diagnosis not present

## 2024-01-07 DIAGNOSIS — M25762 Osteophyte, left knee: Secondary | ICD-10-CM | POA: Diagnosis present

## 2024-01-07 DIAGNOSIS — Z9889 Other specified postprocedural states: Secondary | ICD-10-CM | POA: Diagnosis not present

## 2024-01-07 DIAGNOSIS — Z8249 Family history of ischemic heart disease and other diseases of the circulatory system: Secondary | ICD-10-CM | POA: Diagnosis not present

## 2024-01-07 DIAGNOSIS — Z8619 Personal history of other infectious and parasitic diseases: Secondary | ICD-10-CM | POA: Diagnosis not present

## 2024-01-07 DIAGNOSIS — Z79899 Other long term (current) drug therapy: Secondary | ICD-10-CM | POA: Diagnosis not present

## 2024-01-07 DIAGNOSIS — I129 Hypertensive chronic kidney disease with stage 1 through stage 4 chronic kidney disease, or unspecified chronic kidney disease: Secondary | ICD-10-CM | POA: Diagnosis present

## 2024-01-07 DIAGNOSIS — Z87440 Personal history of urinary (tract) infections: Secondary | ICD-10-CM | POA: Diagnosis not present

## 2024-01-07 DIAGNOSIS — Z9181 History of falling: Secondary | ICD-10-CM | POA: Diagnosis not present

## 2024-01-07 DIAGNOSIS — Z885 Allergy status to narcotic agent status: Secondary | ICD-10-CM | POA: Diagnosis not present

## 2024-01-07 DIAGNOSIS — Z801 Family history of malignant neoplasm of trachea, bronchus and lung: Secondary | ICD-10-CM | POA: Diagnosis not present

## 2024-01-07 DIAGNOSIS — M1712 Unilateral primary osteoarthritis, left knee: Secondary | ICD-10-CM | POA: Diagnosis present

## 2024-01-07 MED ORDER — PNEUMOCOCCAL 20-VAL CONJ VACC 0.5 ML IM SUSY
0.5000 mL | PREFILLED_SYRINGE | INTRAMUSCULAR | Status: DC
  Filled 2024-01-07: qty 0.5

## 2024-01-07 NOTE — Progress Notes (Signed)
   Subjective: 7 Days Post-Op Procedure(s) (LRB): ARTHROPLASTY, KNEE, TOTAL (Left) Patient reports pain as mild.   Patient seen in rounds for Dr. Melodi. Patient is doing well, only minimal complaints regarding pain this AM. Did better with therapy over the weekend.  Objective: Vital signs in last 24 hours: Temp:  [97.7 F (36.5 C)-98 F (36.7 C)] 97.8 F (36.6 C) (09/29 0930) Pulse Rate:  [69-74] 73 (09/29 0930) Resp:  [16-20] 18 (09/29 0930) BP: (100-130)/(43-57) 116/43 (09/29 0930) SpO2:  [95 %-99 %] 97 % (09/29 0930)  Intake/Output from previous day:  Intake/Output Summary (Last 24 hours) at 01/07/2024 1044 Last data filed at 01/06/2024 1738 Gross per 24 hour  Intake 170 ml  Output 170 ml  Net 0 ml    Intake/Output this shift: No intake/output data recorded.  Labs: No results for input(s): HGB in the last 72 hours. No results for input(s): WBC, RBC, HCT, PLT in the last 72 hours. No results for input(s): NA, K, CL, CO2, BUN, CREATININE, GLUCOSE, CALCIUM in the last 72 hours. No results for input(s): LABPT, INR in the last 72 hours.  Exam: General - Patient is Alert and Oriented Extremity - Neurologically intact Neurovascular intact Sensation intact distally Dorsiflexion/Plantar flexion intact Dressing/Incision - clean, dry, no drainage Motor Function - intact, moving foot and toes well on exam.   Past Medical History:  Diagnosis Date   CKD (chronic kidney disease) stage 3, GFR 30-59 ml/min (HCC)    GERD (gastroesophageal reflux disease)    Hypertension    Pneumonia     Assessment/Plan: 7 Days Post-Op Procedure(s) (LRB): ARTHROPLASTY, KNEE, TOTAL (Left) Principal Problem:   OA (osteoarthritis) of knee Active Problems:   Primary osteoarthritis of left knee  Estimated body mass index is 24.96 kg/m as calculated from the following:   Height as of this encounter: 5' 5 (1.651 m).   Weight as of this encounter: 68 kg. Up  with therapy  DVT Prophylaxis - Aspirin  Weight-bearing as tolerated  Discharge to home if cleared with PT  Roxie Mess, PA-C Orthopedic Surgery [phone] 01/07/2024, 10:44 AM

## 2024-01-07 NOTE — Progress Notes (Signed)
 Discharge meds in a secure bag delivered to pt in room - daughter, [name], will take discharge meds home. Pt will d/c tomorrow after additional PT

## 2024-01-07 NOTE — Progress Notes (Signed)
 Physical Therapy Treatment Patient Details Name: Gina [name] MRN: [id number] DOB: 1932-10-21 Today's Date: 01/07/2024   History of Present Illness 89 yo female presents to therapy s/p L TKA on 12/31/2023 due to failure of conservative measures. Pt PMH includes but is not limited to: CKD III, SIRS, fall, hypotension, hypokalemia, GERD, HTN, R foot surgery, R TKA (2010) and R shoulder surgery.    PT Comments  Pt making progress however remains ltd by pain and fatigue. Pt and dtr asking about staying another day. Reviewed this was an obs procedure and pt cannot stay in acute setting for PT, today is POD #6.    Pt initially declining to practice stairs (again); Able to convince pt to try. Up and over portable stairs x 1 with use of RW initially and then had to transition to 2 rails to safely attempt. Daughter - pt is going to dtr's house where there are not any railings. reviewed going upstairs backwards with pt and dtr, pt states she is not doing that however further discussed that pt may not have another option and pt reluctantly says she will try after she rests; will see again this afternoon    If plan is discharge home, recommend the following: Assistance with cooking/housework;Help with stairs or ramp for entrance;Assist for transportation;A little help with walking and/or transfers;A little help with bathing/dressing/bathroom   Can travel by private vehicle        Equipment Recommendations  None recommended by PT    Recommendations for Other Services       Precautions / Restrictions Precautions Precautions: Fall;Knee Recall of Precautions/Restrictions: Impaired Precaution/Restrictions Comments: reviewed proper knee positioning Restrictions LLE Weight Bearing Per Provider Order: Weight bearing as tolerated     Mobility  Bed Mobility Overal bed mobility: Needs Assistance Bed Mobility: Supine to Sit     Supine to sit: Contact guard, HOB elevated     General bed mobility  comments: verbal and visual cues to self assist, incr time needed    Transfers Overall transfer level: Needs assistance Equipment used: Rolling walker (2 wheels) Transfers: Sit to/from Stand Sit to Stand: Min assist           General transfer comment: multimodal cues for UE and LE positioning, increased time and effort. min assist to rise from bed at lowest setting and control descent    Ambulation/Gait Ambulation/Gait assistance: Contact guard assist Gait Distance (Feet): 28 Feet Assistive device: Rolling walker (2 wheels) Gait Pattern/deviations: Step-to pattern, Decreased step length - right, Decreased step length - left, Trunk flexed, Decreased stance time - left Gait velocity: decreased     General Gait Details: verbal cues for sequence, RW positioning, step length, posture; pt reports pain and fatigue limiting distance   Stairs Stairs: Yes Stairs assistance: Min assist Stair Management: Step to pattern, Forwards, Two rails, With walker Number of Stairs: 2 General stair comments: Pt initially declining to practice (again) Able to convince pt to try. Up and over portable stairs x 1 with use of RW initially and then had to transition to 2 rails to safely attempt. Daughter - pt is going to dtr's house where there are not any railings. reviewed going upstairs backwards with pt and dtr, pt states she is not doing that however further discussed that pt may not have another option and pt reluctantly says she will try after she rests   Wheelchair Mobility     Tilt Bed    Modified Rankin (Stroke Patients Only)  Balance   Sitting-balance support: Feet supported Sitting balance-Leahy Scale: Good     Standing balance support: Bilateral upper extremity supported, During functional activity, Reliant on assistive device for balance Standing balance-Leahy Scale: Poor                              Communication Communication Communication:  Impaired Factors Affecting Communication: Hearing impaired  Cognition Arousal: Alert Behavior During Therapy: WFL for tasks assessed/performed   PT - Cognitive impairments: Memory                       PT - Cognition Comments: noted decr carryover from prior sessions Following commands: Intact      Cueing Cueing Techniques: Verbal cues, Tactile cues, Visual cues  Exercises      General Comments        Pertinent Vitals/Pain Pain Assessment Pain Assessment: Faces Pain Score: 4  Faces Pain Scale: Hurts even more Pain Location: L knee/thigh Pain Descriptors / Indicators: Operative site guarding, Grimacing, Aching, Sore Pain Intervention(s): Limited activity within patient's tolerance, Monitored during session, Premedicated before session, Repositioned, Ice applied    Home Living                          Prior Function            PT Goals (current goals can now be found in the care plan section) Acute Rehab PT Goals Patient Stated Goal: to be able to move around better PT Goal Formulation: With patient Time For Goal Achievement: 01/14/24 Potential to Achieve Goals: Good Progress towards PT goals: Progressing toward goals    Frequency    7X/week      PT Plan      Co-evaluation              AM-PAC PT 6 Clicks Mobility   Outcome Measure  Help needed turning from your back to your side while in a flat bed without using bedrails?: A Little Help needed moving from lying on your back to sitting on the side of a flat bed without using bedrails?: A Little Help needed moving to and from a bed to a chair (including a wheelchair)?: A Little Help needed standing up from a chair using your arms (e.g., wheelchair or bedside chair)?: A Little Help needed to walk in hospital room?: A Little Help needed climbing 3-5 steps with a railing? : A Lot 6 Click Score: 17    End of Session Equipment Utilized During Treatment: Gait belt Activity  Tolerance: Patient limited by pain;Patient limited by fatigue Patient left: in chair;with call bell/phone within reach;with chair alarm set;with family/visitor present Nurse Communication: Mobility status PT Visit Diagnosis: Difficulty in walking, not elsewhere classified (R26.2);Pain Pain - Right/Left: Left Pain - part of body: Knee     Time: 8855-8784 PT Time Calculation (min) (ACUTE ONLY): 31 min  Charges:    $Gait Training: 23-37 mins PT General Charges $$ ACUTE PT VISIT: 1 Visit                     Clydell Sposito, PT  Acute Rehab Dept [facility] [phone]  01/07/2024    [facility] 01/07/2024, 12:27 PM

## 2024-01-07 NOTE — Plan of Care (Signed)
  Problem: Health Behavior/Discharge Planning: Goal: Ability to manage health-related needs will improve Outcome: Progressing   Problem: Activity: Goal: Risk for activity intolerance will decrease Outcome: Progressing   Problem: Elimination: Goal: Will not experience complications related to bowel motility Outcome: Progressing   Problem: Pain Managment: Goal: General experience of comfort will improve and/or be controlled Outcome: Progressing   Problem: Safety: Goal: Ability to remain free from injury will improve Outcome: Progressing   Problem: Skin Integrity: Goal: Risk for impaired skin integrity will decrease Outcome: Progressing   Problem: Activity: Goal: Ability to avoid complications of mobility impairment will improve Outcome: Progressing Goal: Range of joint motion will improve Outcome: Progressing

## 2024-01-07 NOTE — Progress Notes (Signed)
 PT TX NOTE   01/07/24 1312  PT Visit Information  Last PT Received On 01/07/24  Assistance Needed Pt  able to ascend and descend 2/3 steps with RW,. L KI in place and min assist. Incr time and heavy cues needed.  This was pt's stair training trial of the day, dtr was present and able to assist. Pt and dtr state they do not feel ready to d/c today. Pt fatigued after session. RN updated.  Plan is for stair training and family again once more tomorrow and then pt is to d/c home. Dtr and pt in agreement with plan.  History of Present Illness 89 yo female presents to therapy s/p L TKA on 12/31/2023 due to failure of conservative measures. Pt PMH includes but is not limited to: CKD III, SIRS, fall, hypotension, hypokalemia, GERD, HTN, R foot surgery, R TKA (2010) and R shoulder surgery.  Subjective Data  Patient Stated Goal to be able to move around better  Precautions  Precautions Fall;Knee  Recall of Precautions/Restrictions Impaired  Precaution/Restrictions Comments reviewed proper knee positioning  Restrictions  LLE Weight Bearing Per Provider Order WBAT  Other Position/Activity Restrictions placed KI LLE prior to stair training  Pain Assessment  Pain Assessment Faces  Faces Pain Scale 6  Pain Location L knee/thigh  Pain Descriptors / Indicators Operative site guarding;Grimacing;Aching;Sore  Pain Intervention(s) Limited activity within patient's tolerance;Monitored during session;Premedicated before session;Repositioned;Ice applied  Cognition  Arousal Alert  Behavior During Therapy WFL for tasks assessed/performed  PT - Cognitive impairments Memory  PT - Cognition Comments noted decr carryover from prior sessions  Following Commands  Following commands Intact  Cueing  Cueing Techniques Verbal cues;Tactile cues;Visual cues  Communication  Communication Impaired  Factors Affecting Communication Hearing impaired  Bed Mobility  General bed mobility comments  (in chair and returned to  same)  Transfers  Overall transfer level Needs assistance  Equipment used Rolling walker (2 wheels)  Transfers Sit to/from Stand  Sit to Stand Min assist  General transfer comment multimodal cues for UE and LE positioning, increased time and effort. min assist to rise from bed at lowest setting and control descent  Ambulation/Gait  Ambulation/Gait assistance Contact guard assist  Gait Distance (Feet) 30 Feet  Assistive device Rolling walker (2 wheels)  Gait Pattern/deviations Step-to pattern;Decreased step length - right;Decreased step length - left;Trunk flexed;Decreased stance time - left  General Gait Details verbal cues for sequence, RW positioning, step length, posture; pt reports pain and fatigue limiting distance  Gait velocity decreased  Stairs assistance Min assist  Stair Management Step to pattern;Forwards;Two rails;With walker  Number of Stairs 2  General stair comments pt is able to ascend stairs with heavy multi-modal cues and min assist to balance and manage RW  Balance  Sitting-balance support Feet supported  Sitting balance-Leahy Scale Good  Standing balance support Bilateral upper extremity supported;During functional activity;Reliant on assistive device for balance  Standing balance-Leahy Scale Poor  PT - End of Session  Equipment Utilized During Treatment Gait belt  Activity Tolerance Patient tolerated treatment well;Patient limited by fatigue  Patient left in chair;with call bell/phone within reach;with chair alarm set;with family/visitor present  Nurse Communication Mobility status   PT - Assessment/Plan  PT Visit Diagnosis Difficulty in walking, not elsewhere classified (R26.2);Pain  Pain - Right/Left Left  Pain - part of body Knee  PT Frequency (ACUTE ONLY) 7X/week  Follow Up Recommendations Follow physician's recommendations for discharge plan and follow up therapies  Patient can return home with  the following Assistance with cooking/housework;Help with  stairs or ramp for entrance;Assist for transportation;A little help with walking and/or transfers;A little help with bathing/dressing/bathroom  PT equipment None recommended by PT  AM-PAC PT 6 Clicks Mobility Outcome Measure (Version 2)  Help needed turning from your back to your side while in a flat bed without using bedrails? 3  Help needed moving from lying on your back to sitting on the side of a flat bed without using bedrails? 3  Help needed moving to and from a bed to a chair (including a wheelchair)? 3  Help needed standing up from a chair using your arms (e.g., wheelchair or bedside chair)? 3  Help needed to walk in hospital room? 3  Help needed climbing 3-5 steps with a railing?  2  6 Click Score 17  Consider Recommendation of Discharge To: Home with [facility]  Progressive Mobility  Activity Ambulated with assistance  PT Goal Progression  Progress towards PT goals Progressing toward goals  Acute Rehab PT Goals  PT Goal Formulation With patient  Time For Goal Achievement 01/14/24  Potential to Achieve Goals Good  PT Time Calculation  PT Start Time (ACUTE ONLY) 1259  PT Stop Time (ACUTE ONLY) 1328  PT Time Calculation (min) (ACUTE ONLY) 29 min  PT General Charges  $$ ACUTE PT VISIT 1 Visit  PT Treatments  $Gait Training 23-37 mins

## 2024-01-08 NOTE — Progress Notes (Signed)
 Physical Therapy Treatment Patient Details Name: Gina [name] MRN: [id number] DOB: 04/16/1932 Today's Date: 01/08/2024   History of Present Illness 89 yo female presents to therapy s/p L TKA on 12/31/2023 due to failure of conservative measures. Pt PMH includes but is not limited to: CKD III, SIRS, fall, hypotension, hypokalemia, GERD, HTN, R foot surgery, R TKA (2010) and R shoulder surgery.    PT Comments  Pt is progressing in all aspects of mobility, improved wt shift to LLE during gait and stair training. Pt dtr present for session and able to provide needed assist and cues.  Pt is meeting goals and ready for d/c with family assist as needed.    If plan is discharge home, recommend the following: Assistance with cooking/housework;Help with stairs or ramp for entrance;Assist for transportation;A little help with walking and/or transfers;A little help with bathing/dressing/bathroom   Can travel by private vehicle        Equipment Recommendations  None recommended by PT    Recommendations for Other Services       Precautions / Restrictions Precautions Precautions: Fall;Knee Recall of Precautions/Restrictions: Impaired Precaution/Restrictions Comments: reviewed proper knee positioning Restrictions LLE Weight Bearing Per Provider Order: Weight bearing as tolerated     Mobility  Bed Mobility Overal bed mobility: Needs Assistance Bed Mobility: Supine to Sit     Supine to sit: Supervision, HOB elevated     General bed mobility comments: verbal and visual cues to self assist, incr time needed    Transfers Overall transfer level: Needs assistance Equipment used: Rolling walker (2 wheels) Transfers: Sit to/from Stand Sit to Stand: Contact guard assist           General transfer comment: multimodal cues for UE and LE positioning, increased time and effort. CGA to rise from bed at lowest setting and control descent    Ambulation/Gait Ambulation/Gait assistance: Contact  guard assist Gait Distance (Feet): 30 Feet Assistive device: Rolling walker (2 wheels) Gait Pattern/deviations: Step-to pattern, Decreased step length - right, Decreased step length - left, Trunk flexed, Decreased stance time - left Gait velocity: decreased     General Gait Details: ongoing education for sequence, RW positioning, step length, posture; pt reports pain and fatigue limiting distance; dtr present and able to return demo   Stairs   Stairs assistance: Min assist Stair Management: Step to pattern, Forwards, Two rails, With walker Number of Stairs: 2 General stair comments: pt is able to ascend stairs with heavy multi-modal cues and min assist to balance and manage RW; dtr present and able to return demo cues and assist needed   Wheelchair Mobility     Tilt Bed    Modified Rankin (Stroke Patients Only)       Balance   Sitting-balance support: Feet supported Sitting balance-Leahy Scale: Good     Standing balance support: Bilateral upper extremity supported, During functional activity, Reliant on assistive device for balance Standing balance-Leahy Scale: Poor                              Communication Communication Communication: Impaired Factors Affecting Communication: Hearing impaired  Cognition Arousal: Alert Behavior During Therapy: WFL for tasks assessed/performed   PT - Cognitive impairments: Memory                       PT - Cognition Comments: noted decr carryover with safety and sequencing of functional tasks from prior sessions Following  commands: Intact      Cueing Cueing Techniques: Verbal cues, Tactile cues, Visual cues  Exercises      General Comments        Pertinent Vitals/Pain Pain Assessment Pain Assessment: Faces Faces Pain Scale: Hurts even more Pain Location: L knee/thigh Pain Descriptors / Indicators: Operative site guarding, Grimacing, Aching, Sore Pain Intervention(s): Limited activity within  patient's tolerance, Monitored during session, Premedicated before session, Repositioned, Ice applied    Home Living                          Prior Function            PT Goals (current goals can now be found in the care plan section) Acute Rehab PT Goals Patient Stated Goal: to be able to move around better PT Goal Formulation: With patient Time For Goal Achievement: 01/14/24 Potential to Achieve Goals: Good Progress towards PT goals: Progressing toward goals    Frequency    7X/week      PT Plan      Co-evaluation              AM-PAC PT 6 Clicks Mobility   Outcome Measure  Help needed turning from your back to your side while in a flat bed without using bedrails?: A Little Help needed moving from lying on your back to sitting on the side of a flat bed without using bedrails?: A Little Help needed moving to and from a bed to a chair (including a wheelchair)?: A Little Help needed standing up from a chair using your arms (e.g., wheelchair or bedside chair)?: A Little Help needed to walk in hospital room?: A Little Help needed climbing 3-5 steps with a railing? : A Lot 6 Click Score: 17    End of Session Equipment Utilized During Treatment: Gait belt Activity Tolerance: Patient tolerated treatment well;Patient limited by fatigue Patient left: in chair;with call bell/phone within reach;with chair alarm set;with family/visitor present Nurse Communication: Mobility status PT Visit Diagnosis: Difficulty in walking, not elsewhere classified (R26.2);Pain Pain - Right/Left: Left Pain - part of body: Knee     Time: 1005-1034 PT Time Calculation (min) (ACUTE ONLY): 29 min  Charges:    $Gait Training: 23-37 mins PT General Charges $$ ACUTE PT VISIT: 1 Visit                     Mars Scheaffer, PT  Acute Rehab Dept [facility]) [phone]  01/08/2024    [facility] 01/08/2024, 11:40 AM

## 2024-01-08 NOTE — Progress Notes (Signed)
   Subjective: 8 Days Post-Op Procedure(s) (LRB): ARTHROPLASTY, KNEE, TOTAL (Left) Patient reports pain as mild.   Patient seen in rounds by Dr. Melodi. Patient is doing well this morning. Was cleared with PT yesterday, should be ready to go home today.  Objective: Vital signs in last 24 hours: Temp:  [97.6 F (36.4 C)-97.8 F (36.6 C)] 97.8 F (36.6 C) (09/30 0536) Pulse Rate:  [73-99] 99 (09/30 0851) Resp:  [15-18] 17 (09/30 0536) BP: (103-122)/(43-70) 115/70 (09/30 0851) SpO2:  [96 %-97 %] 96 % (09/30 0851)  Intake/Output from previous day:  Intake/Output Summary (Last 24 hours) at 01/08/2024 0909 Last data filed at 01/08/2024 0605 Gross per 24 hour  Intake 540 ml  Output 950 ml  Net -410 ml    Intake/Output this shift: No intake/output data recorded.  Labs: No results for input(s): HGB in the last 72 hours. No results for input(s): WBC, RBC, HCT, PLT in the last 72 hours. No results for input(s): NA, K, CL, CO2, BUN, CREATININE, GLUCOSE, CALCIUM in the last 72 hours. No results for input(s): LABPT, INR in the last 72 hours.  Exam: General - Patient is Alert and Oriented Extremity - Neurologically intact Neurovascular intact Sensation intact distally Dorsiflexion/Plantar flexion intact Dressing/Incision - clean, dry, no drainage Motor Function - intact, moving foot and toes well on exam.   Past Medical History:  Diagnosis Date   CKD (chronic kidney disease) stage 3, GFR 30-59 ml/min (HCC)    GERD (gastroesophageal reflux disease)    Hypertension    Pneumonia     Assessment/Plan: 8 Days Post-Op Procedure(s) (LRB): ARTHROPLASTY, KNEE, TOTAL (Left) Principal Problem:   OA (osteoarthritis) of knee Active Problems:   Primary osteoarthritis of left knee  Estimated body mass index is 24.96 kg/m as calculated from the following:   Height as of this encounter: 5' 5 (1.651 m).   Weight as of this encounter: 68 kg. Up with  therapy  DVT Prophylaxis - Aspirin  Weight-bearing as tolerated  Discharge to home after first session of PT  Roxie Mess, PA-C Orthopedic Surgery [phone] 01/08/2024, 9:09 AM

## 2024-01-10 NOTE — Discharge Summary (Signed)
 Patient ID: Gina [name] MRN: [id number] DOB/AGE: 10-Nov-1932 89 y.o.  Admit date: 12/31/2023 Discharge date: 01/08/2024  Admission Diagnoses:  Principal Problem:   OA (osteoarthritis) of knee Active Problems:   Primary osteoarthritis of left knee   Discharge Diagnoses:  Same  Past Medical History:  Diagnosis Date   CKD (chronic kidney disease) stage 3, GFR 30-59 ml/min (HCC)    GERD (gastroesophageal reflux disease)    Hypertension    Pneumonia     Surgeries: Procedure(s): ARTHROPLASTY, KNEE, TOTAL on 12/31/2023   Consultants:   Discharged Condition: Improved  Hospital Course: Gina [name] is an 89 y.o. female who was admitted 12/31/2023 for operative treatment ofOA (osteoarthritis) of knee. Patient has severe unremitting pain that affects sleep, daily activities, and work/hobbies. After pre-op clearance the patient was taken to the operating room on 12/31/2023 and underwent  Procedure(s): ARTHROPLASTY, KNEE, TOTAL.    Patient was given perioperative antibiotics:  Anti-infectives (From admission, onward)    Start     Dose/Rate Route Frequency Ordered Stop   12/31/23 1530  ceFAZolin  (ANCEF ) IVPB 2g/100 mL premix        2 g 200 mL/hr over 30 Minutes Intravenous Every 6 hours 12/31/23 1333 01/01/24 0807   12/31/23 0730  ceFAZolin  (ANCEF ) IVPB 2g/100 mL premix        2 g 200 mL/hr over 30 Minutes Intravenous On call to O.R. 12/31/23 0720 12/31/23 0932   12/31/23 0727  ceFAZolin  (ANCEF ) 2-4 GM/100ML-% IVPB       Note to Pharmacy: Gina [name] K: cabinet override      12/31/23 0727 12/31/23 0941        Patient was given sequential compression devices, early ambulation, and chemoprophylaxis to prevent DVT.  Patient benefited maximally from hospital stay and there were no complications.    Recent vital signs: No data found.   Recent laboratory studies: No results for input(s): WBC, HGB, HCT, PLT, NA, K, CL, CO2, BUN, CREATININE, GLUCOSE, INR,  CALCIUM in the last 72 hours.  Invalid input(s): PT, 2   Discharge Medications:   Allergies as of 01/08/2024       Reactions   Codeine Other (See Comments)   Insomnia   Strawberry Extract Hives        Medication List     TAKE these medications    acetaminophen  500 MG tablet Commonly known as: TYLENOL  Take 500-1,000 mg by mouth every 6 (six) hours as needed for moderate pain (pain score 4-6).   Aspirin  Low Dose 81 MG chewable tablet Generic drug: aspirin  Chew 1 tablet (81 mg total) by mouth 2 (two) times daily for 21 days. Then take one 81 mg aspirin  once a day for three weeks. Then discontinue aspirin .   HYDROmorphone  2 MG tablet Commonly known as: DILAUDID  Take 0.5-1 tablets (1-2 mg total) by mouth every 6 (six) hours as needed for severe pain (pain score 7-10).   methocarbamol  500 MG tablet Commonly known as: ROBAXIN  Take 1 tablet (500 mg total) by mouth every 6 (six) hours as needed for muscle spasms.   omeprazole 20 MG capsule Commonly known as: PRILOSEC Take 20 mg by mouth daily.   ondansetron  4 MG tablet Commonly known as: ZOFRAN  Take 1 tablet (4 mg total) by mouth every 6 (six) hours as needed for nausea.   SYSTANE OP Place 1 drop into both eyes as needed (Dry eye).   traMADol  50 MG tablet Commonly known as: ULTRAM  Take 1-2 tablets (50-100 mg total) by mouth  every 6 (six) hours as needed for moderate pain (pain score 4-6).   valsartan  40 MG tablet Commonly known as: Diovan  Take 1 tablet (40 mg total) by mouth daily. Hold if sbp less than 100 What changed:  how much to take additional instructions               Discharge Care Instructions  (From admission, onward)           Start     Ordered   01/01/24 0000  Weight bearing as tolerated        01/01/24 0804   01/01/24 0000  Change dressing       Comments: You may remove the bulky bandage (ACE wrap and gauze) two days after surgery. You will have an adhesive waterproof bandage  underneath. Leave this in place until your first follow-up appointment.   01/01/24 0804            Diagnostic Studies: No results found.  Disposition: Discharge disposition: 01-Home or Self Care       Discharge Instructions     Call MD / Call 911   Complete by: As directed    If you experience chest pain or shortness of breath, CALL 911 and be transported to the hospital emergency room.  If you develope a fever above 101 F, pus (white drainage) or increased drainage or redness at the wound, or calf pain, call your surgeon's office.   Change dressing   Complete by: As directed    You may remove the bulky bandage (ACE wrap and gauze) two days after surgery. You will have an adhesive waterproof bandage underneath. Leave this in place until your first follow-up appointment.   Constipation Prevention   Complete by: As directed    Drink plenty of fluids.  Prune juice may be helpful.  You may use a stool softener, such as Colace (over the counter) 100 mg twice a day.  Use MiraLax  (over the counter) for constipation as needed.   Diet - low sodium heart healthy   Complete by: As directed    Do not put a pillow under the knee. Place it under the heel.   Complete by: As directed    Driving restrictions   Complete by: As directed    No driving for two weeks   Post-operative opioid taper instructions:   Complete by: As directed    POST-OPERATIVE OPIOID TAPER INSTRUCTIONS: It is important to wean off of your opioid medication as soon as possible. If you do not need pain medication after your surgery it is ok to stop day one. Opioids include: Codeine, Hydrocodone(Norco, Vicodin), Oxycodone (Percocet, oxycontin ) and hydromorphone  amongst others.  Long term and even short term use of opiods can cause: Increased pain response Dependence Constipation Depression Respiratory depression And more.  Withdrawal symptoms can include Flu like symptoms Nausea, vomiting And more Techniques to  manage these symptoms Hydrate well Eat regular healthy meals Stay active Use relaxation techniques(deep breathing, meditating, yoga) Do Not substitute Alcohol  to help with tapering If you have been on opioids for less than two weeks and do not have pain than it is ok to stop all together.  Plan to wean off of opioids This plan should start within one week post op of your joint replacement. Maintain the same interval or time between taking each dose and first decrease the dose.  Cut the total daily intake of opioids by one tablet each day Next start to increase the time between doses.  The last dose that should be eliminated is the evening dose.      TED hose   Complete by: As directed    Use stockings (TED hose) for three weeks on both leg(s).  You may remove them at night for sleeping.   Weight bearing as tolerated   Complete by: As directed         Follow-up Information     Aluisio, Dempsey, MD. Schedule an appointment as soon as possible for a visit in 2 week(s).   Specialty: Orthopedic Surgery Contact information: [address] [place] [address] [phone]                  Signed: Roxie Mess 01/10/2024, 12:08 PM

## 2024-01-23 ENCOUNTER — Other Ambulatory Visit (HOSPITAL_COMMUNITY): Payer: Self-pay

## 2024-02-14 ENCOUNTER — Emergency Department (HOSPITAL_COMMUNITY)

## 2024-02-14 ENCOUNTER — Encounter (HOSPITAL_COMMUNITY): Payer: Self-pay

## 2024-02-14 ENCOUNTER — Other Ambulatory Visit: Payer: Self-pay

## 2024-02-14 ENCOUNTER — Observation Stay (HOSPITAL_COMMUNITY)
Admission: EM | Admit: 2024-02-14 | Discharge: 2024-02-15 | Disposition: A | Discharge status: Skilled Nursing Facility | Attending: Internal Medicine | Admitting: Internal Medicine

## 2024-02-14 DIAGNOSIS — N2889 Other specified disorders of kidney and ureter: Secondary | ICD-10-CM | POA: Diagnosis not present

## 2024-02-14 DIAGNOSIS — E872 Acidosis, unspecified: Secondary | ICD-10-CM | POA: Diagnosis not present

## 2024-02-14 DIAGNOSIS — I129 Hypertensive chronic kidney disease with stage 1 through stage 4 chronic kidney disease, or unspecified chronic kidney disease: Secondary | ICD-10-CM | POA: Insufficient documentation

## 2024-02-14 DIAGNOSIS — I959 Hypotension, unspecified: Secondary | ICD-10-CM | POA: Diagnosis present

## 2024-02-14 DIAGNOSIS — R2689 Other abnormalities of gait and mobility: Secondary | ICD-10-CM | POA: Insufficient documentation

## 2024-02-14 DIAGNOSIS — G9341 Metabolic encephalopathy: Secondary | ICD-10-CM | POA: Diagnosis not present

## 2024-02-14 DIAGNOSIS — E860 Dehydration: Principal | ICD-10-CM

## 2024-02-14 DIAGNOSIS — M6281 Muscle weakness (generalized): Secondary | ICD-10-CM | POA: Insufficient documentation

## 2024-02-14 DIAGNOSIS — R2681 Unsteadiness on feet: Secondary | ICD-10-CM | POA: Diagnosis not present

## 2024-02-14 DIAGNOSIS — R410 Disorientation, unspecified: Secondary | ICD-10-CM

## 2024-02-14 DIAGNOSIS — N1831 Chronic kidney disease, stage 3a: Secondary | ICD-10-CM | POA: Diagnosis not present

## 2024-02-14 DIAGNOSIS — R6510 Systemic inflammatory response syndrome (SIRS) of non-infectious origin without acute organ dysfunction: Secondary | ICD-10-CM | POA: Diagnosis not present

## 2024-02-14 DIAGNOSIS — I471 Supraventricular tachycardia, unspecified: Secondary | ICD-10-CM

## 2024-02-14 DIAGNOSIS — Z66 Do not resuscitate: Secondary | ICD-10-CM

## 2024-02-14 LAB — I-STAT CG4 LACTIC ACID, ED
Lactic Acid, Venous: 2 mmol/L (ref 0.5–1.9)
Lactic Acid, Venous: 2.8 mmol/L (ref 0.5–1.9)

## 2024-02-14 LAB — CBC WITH DIFFERENTIAL/PLATELET
Abs Immature Granulocytes: 0.08 K/uL — ABNORMAL HIGH (ref 0.00–0.07)
Basophils Absolute: 0 K/uL (ref 0.0–0.1)
Basophils Relative: 0 %
Eosinophils Absolute: 0 K/uL (ref 0.0–0.5)
Eosinophils Relative: 0 %
HCT: 37.6 % (ref 36.0–46.0)
Hemoglobin: 12.4 g/dL (ref 12.0–15.0)
Immature Granulocytes: 1 %
Lymphocytes Relative: 1 %
Lymphs Abs: 0.1 K/uL — ABNORMAL LOW (ref 0.7–4.0)
MCH: 33.5 pg (ref 26.0–34.0)
MCHC: 33 g/dL (ref 30.0–36.0)
MCV: 101.6 fL — ABNORMAL HIGH (ref 80.0–100.0)
Monocytes Absolute: 0.5 K/uL (ref 0.1–1.0)
Monocytes Relative: 3 %
Neutro Abs: 15.1 K/uL — ABNORMAL HIGH (ref 1.7–7.7)
Neutrophils Relative %: 95 %
Platelets: 194 K/uL (ref 150–400)
RBC: 3.7 MIL/uL — ABNORMAL LOW (ref 3.87–5.11)
RDW: 13.2 % (ref 11.5–15.5)
WBC: 15.8 K/uL — ABNORMAL HIGH (ref 4.0–10.5)
nRBC: 0 % (ref 0.0–0.2)

## 2024-02-14 LAB — COMPREHENSIVE METABOLIC PANEL WITH GFR
ALT: 7 U/L (ref 0–44)
AST: 20 U/L (ref 15–41)
Albumin: 3.4 g/dL — ABNORMAL LOW (ref 3.5–5.0)
Alkaline Phosphatase: 92 U/L (ref 38–126)
Anion gap: 9 (ref 5–15)
BUN: 19 mg/dL (ref 8–23)
CO2: 25 mmol/L (ref 22–32)
Calcium: 9.9 mg/dL (ref 8.9–10.3)
Chloride: 104 mmol/L (ref 98–111)
Creatinine, Ser: 1.05 mg/dL — ABNORMAL HIGH (ref 0.44–1.00)
GFR, Estimated: 51 mL/min — ABNORMAL LOW (ref >60)
Glucose, Bld: 98 mg/dL (ref 70–99)
Potassium: 4 mmol/L (ref 3.5–5.1)
Sodium: 138 mmol/L (ref 135–145)
Total Bilirubin: 1 mg/dL (ref 0.0–1.2)
Total Protein: 6 g/dL — ABNORMAL LOW (ref 6.5–8.1)

## 2024-02-14 LAB — URINALYSIS, W/ REFLEX TO CULTURE (INFECTION SUSPECTED)
Bacteria, UA: NONE SEEN
Bilirubin Urine: NEGATIVE
Glucose, UA: NEGATIVE mg/dL
Hgb urine dipstick: NEGATIVE
Ketones, ur: NEGATIVE mg/dL
Leukocytes,Ua: NEGATIVE
Nitrite: NEGATIVE
Protein, ur: NEGATIVE mg/dL
Specific Gravity, Urine: 1.008 (ref 1.005–1.030)
pH: 5 (ref 5.0–8.0)

## 2024-02-14 LAB — PROTIME-INR
INR: 1.1 (ref 0.8–1.2)
Prothrombin Time: 15.2 s (ref 11.4–15.2)

## 2024-02-14 MED ORDER — IOHEXOL 350 MG/ML SOLN
100.0000 mL | Freq: Once | INTRAVENOUS | Status: AC | PRN
  Administered 2024-02-14: 100 mL via INTRAVENOUS

## 2024-02-14 MED ORDER — SODIUM CHLORIDE 0.9 % IV BOLUS
500.0000 mL | Freq: Once | INTRAVENOUS | Status: AC
  Administered 2024-02-14: 500 mL via INTRAVENOUS

## 2024-02-14 MED ORDER — SODIUM CHLORIDE 0.9 % IV BOLUS
1000.0000 mL | Freq: Once | INTRAVENOUS | Status: AC
  Administered 2024-02-14: 1000 mL via INTRAVENOUS

## 2024-02-14 MED ORDER — SODIUM CHLORIDE 0.9 % IV SOLN: Freq: Once | INTRAVENOUS | Status: DC

## 2024-02-14 NOTE — ED Provider Notes (Signed)
 [place] EMERGENCY DEPARTMENT AT [facility] Provider Note   CSN: [id number] Arrival date & time: 02/14/24  1718     Patient presents with: Altered Mental Status and Hypotension   Gina [name] is a 89 y.o. female.   I have no concern for other source for possible infection.  I have no concern for septic joint.  Patient here from nursing home with altered mental status may be low blood pressure.  She was put on antibiotic for UTI yesterday with Macrobid.  She has been in rehab.  She had a knee replaced back in September got a urinary tract infection shortly after got admitted to the hospital and then got discharged to nursing facility.  She is somewhat progressed well there with her therapy but got worse again over this past few days.  They got some blood work and urinalysis done the other day and started her on Macrobid.  Unsure if maybe fever today.  Was hypotensive with EMS with blood pressure of 80s but she was sleeping but now she is awake with normal blood pressure.  She did throw up 1 time.  But denies any major abdominal pain cough or sputum production.  Sounds like she had a chest x-ray done today that was unremarkable.  The history is provided by the patient and a caregiver.       Prior to Admission medications   Medication Sig Start Date End Date Taking? Authorizing Provider  acetaminophen  (TYLENOL ) 500 MG tablet Take 500-1,000 mg by mouth every 6 (six) hours as needed for moderate pain (pain score 4-6).    [provider]  aspirin  81 MG chewable tablet Chew 1 tablet (81 mg total) by mouth 2 (two) times daily for 21 days. Then take one 81 mg aspirin  once a day for three weeks. Then discontinue aspirin . 01/01/24 02/18/24  Edmisten, Roxie CROME, PA  HYDROmorphone  (DILAUDID ) 2 MG tablet Take 0.5-1 tablets (1-2 mg total) by mouth every 6 (six) hours as needed for severe pain (pain score 7-10). 01/01/24   Edmisten, Kristie L, PA  methocarbamol  (ROBAXIN ) 500 MG  tablet Take 1 tablet (500 mg total) by mouth every 6 (six) hours as needed for muscle spasms. 01/01/24   Edmisten, Kristie L, PA  omeprazole (PRILOSEC) 20 MG capsule Take 20 mg by mouth daily. 04/05/20   [provider]  ondansetron  (ZOFRAN ) 4 MG tablet Take 1 tablet (4 mg total) by mouth every 6 (six) hours as needed for nausea. 01/01/24   Edmisten, Roxie CROME, PA  Polyethyl Glycol-Propyl Glycol (SYSTANE OP) Place 1 drop into both eyes as needed (Dry eye).    [provider]  traMADol  (ULTRAM ) 50 MG tablet Take 1-2 tablets (50-100 mg total) by mouth every 6 (six) hours as needed for moderate pain (pain score 4-6). 01/01/24   Edmisten, Kristie L, PA  valsartan  (DIOVAN ) 40 MG tablet Take 1 tablet (40 mg total) by mouth daily. Hold if sbp less than 100 Patient taking differently: Take 20 mg by mouth daily. 11/24/21 12/31/23  Jerri Keys, MD    Allergies: Codeine and Strawberry extract    Review of Systems  Updated Vital Signs BP 128/69   Pulse 84   Temp 98 F (36.7 C) (Oral)   Resp 18   Ht 5' 5 (1.651 m)   Wt 68 kg   SpO2 95%   BMI 24.95 kg/m   Physical Exam Vitals and nursing note reviewed.  Constitutional:      General: She  is not in acute distress.    Appearance: She is well-developed. She is not ill-appearing.  HENT:     Head: Normocephalic and atraumatic.     Nose: Nose normal.     Mouth/Throat:     Mouth: Mucous membranes are moist.  Eyes:     Extraocular Movements: Extraocular movements intact.     Conjunctiva/sclera: Conjunctivae normal.     Pupils: Pupils are equal, round, and reactive to light.  Cardiovascular:     Rate and Rhythm: Normal rate and regular rhythm.     Pulses: Normal pulses.     Heart sounds: Normal heart sounds. No murmur heard. Pulmonary:     Effort: Pulmonary effort is normal. No respiratory distress.     Breath sounds: Normal breath sounds.  Abdominal:     General: Abdomen is flat.     Palpations: Abdomen is soft.     Tenderness:  There is no abdominal tenderness.  Musculoskeletal:        General: No swelling or tenderness.     Cervical back: Normal range of motion and neck supple.     Comments: Normal range of motion of both lower extremities, there is no major abnormality to her knees surgical site on the left knee appears clean dry and intact she got good range of motion without any major difficulty  Skin:    General: Skin is warm and dry.     Capillary Refill: Capillary refill takes less than 2 seconds.  Neurological:     General: No focal deficit present.     Mental Status: She is alert and oriented to person, place, and time.     Cranial Nerves: No cranial nerve deficit.     Sensory: No sensory deficit.     Motor: No weakness.     Coordination: Coordination normal.     Comments: Awake and alert and follows commands normal strength and sensation throughout  Psychiatric:        Mood and Affect: Mood normal.     (all labs ordered are listed, but only abnormal results are displayed) Labs Reviewed  COMPREHENSIVE METABOLIC PANEL WITH GFR - Abnormal; Notable for the following components:      Result Value   Creatinine, Ser 1.05 (*)    Total Protein 6.0 (*)    Albumin 3.4 (*)    GFR, Estimated 51 (*)    All other components within normal limits  CBC WITH DIFFERENTIAL/PLATELET - Abnormal; Notable for the following components:   WBC 15.8 (*)    RBC 3.70 (*)    MCV 101.6 (*)    Neutro Abs 15.1 (*)    Lymphs Abs 0.1 (*)    Abs Immature Granulocytes 0.08 (*)    All other components within normal limits  I-STAT CG4 LACTIC ACID, ED - Abnormal; Notable for the following components:   Lactic Acid, Venous 2.0 (*)    All other components within normal limits  I-STAT CG4 LACTIC ACID, ED - Abnormal; Notable for the following components:   Lactic Acid, Venous 2.8 (*)    All other components within normal limits  CULTURE, BLOOD (ROUTINE X 2)  CULTURE, BLOOD (ROUTINE X 2)  PROTIME-INR  URINALYSIS, W/ REFLEX TO  CULTURE (INFECTION SUSPECTED)  CBC WITH DIFFERENTIAL/PLATELET    EKG: EKG Interpretation Date/Time:  Thursday February 14 2024 17:27:28 EST Ventricular Rate:  77 PR Interval:  224 QRS Duration:  104 QT Interval:  421 QTC Calculation: 431 R Axis:   -29  Text  Interpretation: Sinus rhythm Multiple premature complexes, vent & supraven Confirmed by Ruthe Cornet [phone]) on 02/14/2024 5:45:11 PM  Radiology: CT Angio Chest PE W and/or Wo Contrast Result Date: 02/14/2024 EXAM: CTA CHEST PE WITHOUT AND WITH CONTRAST CT ABDOMEN AND PELVIS WITH CONTRAST 02/14/2024 09:00:45 PM TECHNIQUE: CTA of the chest was performed after the administration of 100 mL of iohexol  (OMNIPAQUE ) 350 MG/ML injection. Multiplanar reformatted images are provided for review. MIP images are provided for review. CT of the abdomen and pelvis was performed with the administration of intravenous contrast. Automated exposure control, iterative reconstruction, and/or weight based adjustment of the mA/kV was utilized to reduce the radiation dose to as low as reasonably achievable. COMPARISON: Comparison with same day chest x-ray and CT chest 11/21/2021. CLINICAL HISTORY: Pulmonary embolism (PE) suspected, high prob. Acute um localized abdominal pain. Cough. FINDINGS: CHEST: PULMONARY ARTERIES: Pulmonary arteries are adequately opacified for evaluation. No intraluminal filling defect to suggest pulmonary embolism. Main pulmonary artery is normal in caliber. MEDIASTINUM: No mediastinal lymphadenopathy. The heart and pericardium demonstrate no acute abnormality. Coronary artery and aortic atherosclerotic calcification. There is no acute abnormality of the thoracic aorta. LUNGS AND PLEURA: Bronchial wall thickening with mucus plugging and bronchiectasis/bronchiolectasis in the lower lobes. No focal consolidation or pulmonary edema. No pleural effusion or pneumothorax. SOFT TISSUES AND BONES: No acute bone or soft tissue abnormality. ABDOMEN AND  PELVIS: LIVER: The liver is unremarkable. GALLBLADDER AND BILE DUCTS: Distended gallbladder. No biliary ductal dilatation. SPLEEN: Spleen demonstrates no acute abnormality. 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 No pancreatic ductal dilation. ADRENAL GLANDS: Stable left adrenal adenoma. KIDNEYS, URETERS AND BLADDER: Punctate nonobstructing bilateral nephrolithiasis. Heterogeneously enhancing mass in the lower pole of the left kidney measures 4.7 cm. Tumor thrombus extends into an inferior branch of the left renal vein. No hydronephrosis. No perinephric or periureteral stranding. Urinary bladder is unremarkable. GI AND BOWEL: Diverticulosis without diverticulitis. Stomach and duodenal sweep demonstrate no acute abnormality. There is no bowel obstruction. No abnormal bowel wall thickening or distension. REPRODUCTIVE: 5.7 cm fibroid in the uterus. PERITONEUM AND RETROPERITONEUM: No ascites or free air. LYMPH NODES: No lymphadenopathy. BONES AND SOFT TISSUES: No acute abnormality of the visualized bones. No focal soft tissue abnormality. IMPRESSION: 1. No pulmonary embolism. 2. Findings consistent with chronic bronchitis. 3. 4.7 cm enhancing mass in the lower pole of the left kidney highly suspicious for renal cell carcinoma. There is tumor thrombus extending into an inferior branch of the left renal vein; recommend urology consultation. 4. 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 Follow up as appropriate given age and comorbidities. Electronically signed by: Norman Gatlin MD 02/14/2024 09:22 PM EST RP Workstation: [id number]   CT ABDOMEN PELVIS W CONTRAST Result Date: 02/14/2024 EXAM: CTA CHEST PE WITHOUT AND WITH CONTRAST CT ABDOMEN AND PELVIS WITH CONTRAST 02/14/2024 09:00:45 PM TECHNIQUE: CTA of the chest was performed after the administration of 100 mL of iohexol   (OMNIPAQUE ) 350 MG/ML injection. Multiplanar reformatted images are provided for review. MIP images are provided for review. CT of the abdomen and pelvis was performed with the administration of intravenous contrast. Automated exposure control, iterative reconstruction, and/or weight based adjustment of the mA/kV was utilized to reduce the radiation dose to as low as reasonably achievable. COMPARISON: Comparison with same day chest x-ray and CT chest 11/21/2021. CLINICAL HISTORY: Pulmonary embolism (PE) suspected, high prob. Acute um localized abdominal pain.  Cough. FINDINGS: CHEST: PULMONARY ARTERIES: Pulmonary arteries are adequately opacified for evaluation. No intraluminal filling defect to suggest pulmonary embolism. Main pulmonary artery is normal in caliber. MEDIASTINUM: No mediastinal lymphadenopathy. The heart and pericardium demonstrate no acute abnormality. Coronary artery and aortic atherosclerotic calcification. There is no acute abnormality of the thoracic aorta. LUNGS AND PLEURA: Bronchial wall thickening with mucus plugging and bronchiectasis/bronchiolectasis in the lower lobes. No focal consolidation or pulmonary edema. No pleural effusion or pneumothorax. SOFT TISSUES AND BONES: No acute bone or soft tissue abnormality. ABDOMEN AND PELVIS: LIVER: The liver is unremarkable. GALLBLADDER AND BILE DUCTS: Distended gallbladder. No biliary ductal dilatation. SPLEEN: Spleen demonstrates no acute abnormality. 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 No pancreatic ductal dilation. ADRENAL GLANDS: Stable left adrenal adenoma. KIDNEYS, URETERS AND BLADDER: Punctate nonobstructing bilateral nephrolithiasis. Heterogeneously enhancing mass in the lower pole of the left kidney measures 4.7 cm. Tumor thrombus extends into an inferior branch of the left renal vein. No hydronephrosis. No perinephric or periureteral stranding. Urinary bladder is unremarkable.  GI AND BOWEL: Diverticulosis without diverticulitis. Stomach and duodenal sweep demonstrate no acute abnormality. There is no bowel obstruction. No abnormal bowel wall thickening or distension. REPRODUCTIVE: 5.7 cm fibroid in the uterus. PERITONEUM AND RETROPERITONEUM: No ascites or free air. LYMPH NODES: No lymphadenopathy. BONES AND SOFT TISSUES: No acute abnormality of the visualized bones. No focal soft tissue abnormality. IMPRESSION: 1. No pulmonary embolism. 2. Findings consistent with chronic bronchitis. 3. 4.7 cm enhancing mass in the lower pole of the left kidney highly suspicious for renal cell carcinoma. There is tumor thrombus extending into an inferior branch of the left renal vein; recommend urology consultation. 4. 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 Follow up as appropriate given age and comorbidities. Electronically signed by: Norman Gatlin MD 02/14/2024 09:22 PM EST RP Workstation: [id number]   DG Chest Portable 1 View Result Date: 02/14/2024 CLINICAL DATA:  Cough. EXAM: PORTABLE CHEST 1 VIEW COMPARISON:  Chest recommended 04/27/2022. FINDINGS: Shallow inspiration. No focal consolidation, pleural effusion or pneumothorax. The cardiac silhouette is within limits. Atherosclerotic calcification of the aorta. No acute osseous pathology. Partially visualized right shoulder arthroplasty. IMPRESSION: No active disease. Electronically Signed   By: Vanetta Chou M.D.   On: 02/14/2024 18:03     Procedures   Medications Ordered in the ED  sodium chloride  0.9 % bolus 500 mL (has no administration in time range)  0.9 %  sodium chloride  infusion (has no administration in time range)  sodium chloride  0.9 % bolus 1,000 mL (0 mLs Intravenous Stopped 02/14/24 2031)  sodium chloride  0.9 % bolus 500 mL (0 mLs Intravenous Stopped 02/14/24 2127)  iohexol  (OMNIPAQUE ) 350 MG/ML injection 100 mL (100 mLs Intravenous  Contrast Given 02/14/24 2039)                                    Medical Decision Making Amount and/or Complexity of Data Reviewed Labs: ordered. Radiology: ordered.  Risk Prescription drug management. Decision regarding hospitalization.   Jo Ann Norenberg is here with altered mental status weakness concern for UTI.  She has normal vitals here.  No fever.  She had Tylenol  earlier today.  Unknown if maybe she had fever.  She is hypotensive with EMS in the 80s systolic.  She got 500 cc fluid bolus with  EMS but vital signs are normal here.  She is well-appearing.  Her recent knee surgery a few months ago looks well.  She had a UTI shortly after that with that required hospital admission she finished treatment for this.  Maybe got a little bit confused earlier this week and antibiotics were restarted yesterday for possible UTI.  Per my review of these nursing home notes she had a chest x-ray today that looked okay.  Blood work and urinalysis from few days ago looked okay.  Urinalysis somewhat looks like a dirty catch.  She has been on Macrobid since yesterday.  Ultimately will pursue infectious workup but she does not meet sepsis criteria at this time.  She seems like she has done really well since EMS picked her up.  She did throw up once today.  Is not really endorsing any abdominal pain but may need to consider CT scan.  Will start with basic labs urinalysis chest x-ray fluid bolus blood cultures and lactic acid and reevaluate.  She has not had any falls.  She seems neurologically intact.  I do not think that this is a stroke.  Overall lab work showed a lactic acid of 2 but there is mild leukocytosis of 15.  However no obvious source for infection.  Urinalysis negative for infection.  I did obtain a CT scan of chest abdomen and pelvis that showed no PE but did show new likely renal cell carcinoma.  I am not sure maybe oncology process being you might be causing her some increased fatigue and some  confusion at times.  I talked with Dr. Selma with urology team and he recommends outpatient follow-up.  At this time I do not really think there is obvious infection process but I think that she has had poor p.o. intake and nutrition since being in rehab and think she benefit from further hydration overnight and supportive care in the setting of new cancer diagnosis.  Will defer antibiotics at this time to inpatient team.  Blood cultures have been collected.  I have no further concern for infectious process.  Have no concern for septic joint as well.  Will continue to monitor with hospitalist team.  This chart was dictated using voice recognition software.  Despite best efforts to proofread,  errors can occur which can change the documentation meaning.      Final diagnoses:  Dehydration  Confusion  Renal mass    ED Discharge Orders     None          Ruthe Cornet, DO 02/14/24 2255

## 2024-02-14 NOTE — ED Triage Notes (Signed)
 Pt BIB EMS from [facility] for AMS, Hypotention, UTI is currently being treated. L knee replacement. BP 80/ HR 72 O2 96% RA RR 28 CBG 108 A&Ox2 baseline A&Ox4. Walks with walker or uses wheelchair. 18 LFA. Last BP 106/62

## 2024-02-15 ENCOUNTER — Encounter (HOSPITAL_COMMUNITY): Payer: Self-pay | Admitting: Family Medicine

## 2024-02-15 DIAGNOSIS — E861 Hypovolemia: Secondary | ICD-10-CM

## 2024-02-15 DIAGNOSIS — N2889 Other specified disorders of kidney and ureter: Secondary | ICD-10-CM | POA: Diagnosis not present

## 2024-02-15 DIAGNOSIS — Z66 Do not resuscitate: Secondary | ICD-10-CM

## 2024-02-15 DIAGNOSIS — R6510 Systemic inflammatory response syndrome (SIRS) of non-infectious origin without acute organ dysfunction: Secondary | ICD-10-CM | POA: Diagnosis not present

## 2024-02-15 DIAGNOSIS — I471 Supraventricular tachycardia, unspecified: Secondary | ICD-10-CM | POA: Insufficient documentation

## 2024-02-15 DIAGNOSIS — G9341 Metabolic encephalopathy: Secondary | ICD-10-CM | POA: Diagnosis not present

## 2024-02-15 LAB — CBC
HCT: 36.1 % (ref 36.0–46.0)
Hemoglobin: 11.5 g/dL — ABNORMAL LOW (ref 12.0–15.0)
MCH: 32.2 pg (ref 26.0–34.0)
MCHC: 31.9 g/dL (ref 30.0–36.0)
MCV: 101.1 fL — ABNORMAL HIGH (ref 80.0–100.0)
Platelets: 168 K/uL (ref 150–400)
RBC: 3.57 MIL/uL — ABNORMAL LOW (ref 3.87–5.11)
RDW: 13.2 % (ref 11.5–15.5)
WBC: 17.1 K/uL — ABNORMAL HIGH (ref 4.0–10.5)
nRBC: 0 % (ref 0.0–0.2)

## 2024-02-15 LAB — RPR
RPR Ser Ql: REACTIVE — AB
RPR Titer: 1:1 {titer}

## 2024-02-15 LAB — BASIC METABOLIC PANEL WITH GFR
Anion gap: 9 (ref 5–15)
BUN: 17 mg/dL (ref 8–23)
CO2: 21 mmol/L — ABNORMAL LOW (ref 22–32)
Calcium: 8.9 mg/dL (ref 8.9–10.3)
Chloride: 106 mmol/L (ref 98–111)
Creatinine, Ser: 0.86 mg/dL (ref 0.44–1.00)
GFR, Estimated: >60 mL/min (ref >60)
Glucose, Bld: 85 mg/dL (ref 70–99)
Potassium: 3.5 mmol/L (ref 3.5–5.1)
Sodium: 136 mmol/L (ref 135–145)

## 2024-02-15 LAB — AMMONIA: Ammonia: 33 umol/L (ref 9–35)

## 2024-02-15 LAB — PHOSPHORUS: Phosphorus: 2.4 mg/dL — ABNORMAL LOW (ref 2.5–4.6)

## 2024-02-15 LAB — LACTIC ACID, PLASMA: Lactic Acid, Venous: 1.4 mmol/L (ref 0.5–1.9)

## 2024-02-15 LAB — MAGNESIUM: Magnesium: 1.5 mg/dL — ABNORMAL LOW (ref 1.7–2.4)

## 2024-02-15 LAB — VITAMIN B12: Vitamin B-12: 313 pg/mL (ref 180–914)

## 2024-02-15 LAB — TSH: TSH: 0.468 u[IU]/mL (ref 0.350–4.500)

## 2024-02-15 MED ORDER — POLYVINYL ALCOHOL 1.4 % OP SOLN
2.0000 [drp] | Freq: Three times a day (TID) | OPHTHALMIC | Status: DC
  Filled 2024-02-15: qty 15

## 2024-02-15 MED ORDER — METOPROLOL SUCCINATE ER 25 MG PO TB24: 25.0000 mg | ORAL_TABLET | Freq: Every day | ORAL | Status: DC

## 2024-02-15 MED ORDER — POLYVINYL ALCOHOL 1.4 % OP SOLN: 1.0000 [drp] | OPHTHALMIC | Status: DC | PRN

## 2024-02-15 MED ORDER — SODIUM CHLORIDE 0.9% FLUSH
3.0000 mL | Freq: Two times a day (BID) | INTRAVENOUS | Status: DC
  Administered 2024-02-15: 3 mL via INTRAVENOUS

## 2024-02-15 MED ORDER — ACETAMINOPHEN 325 MG PO TABS: 650.0000 mg | ORAL_TABLET | Freq: Four times a day (QID) | ORAL | Status: DC | PRN

## 2024-02-15 MED ORDER — HEPARIN SODIUM (PORCINE) 5000 UNIT/ML IJ SOLN
5000.0000 [IU] | Freq: Three times a day (TID) | INTRAMUSCULAR | Status: DC
  Administered 2024-02-15 (×2): 5000 [IU] via SUBCUTANEOUS
  Filled 2024-02-15 (×2): qty 1

## 2024-02-15 MED ORDER — ACETAMINOPHEN 500 MG PO TABS
1000.0000 mg | ORAL_TABLET | Freq: Once | ORAL | Status: AC
  Administered 2024-02-15: 1000 mg via ORAL
  Filled 2024-02-15: qty 2

## 2024-02-15 MED ORDER — SENNOSIDES-DOCUSATE SODIUM 8.6-50 MG PO TABS: 1.0000 | ORAL_TABLET | Freq: Every evening | ORAL | Status: DC | PRN

## 2024-02-15 MED ORDER — ASPIRIN 81 MG PO CHEW
81.0000 mg | CHEWABLE_TABLET | Freq: Every day | ORAL | Status: DC
  Administered 2024-02-15: 81 mg via ORAL
  Filled 2024-02-15: qty 1

## 2024-02-15 MED ORDER — ACETAMINOPHEN 650 MG RE SUPP: 650.0000 mg | Freq: Four times a day (QID) | RECTAL | Status: DC | PRN

## 2024-02-15 MED ORDER — SODIUM CHLORIDE 0.9 % IV SOLN
INTRAVENOUS | Status: DC
Start: 2024-02-15 — End: 2024-02-15

## 2024-02-15 MED ORDER — HYPROMELLOSE (GONIOSCOPIC) 2.5 % OP SOLN: 2.0000 [drp] | Freq: Three times a day (TID) | OPHTHALMIC | Status: DC

## 2024-02-15 MED ORDER — TRAMADOL HCL 50 MG PO TABS: 50.0000 mg | ORAL_TABLET | Freq: Three times a day (TID) | ORAL | 0 refills | Status: DC | PRN

## 2024-02-15 MED ORDER — ONDANSETRON HCL 4 MG/2ML IJ SOLN: 4.0000 mg | Freq: Four times a day (QID) | INTRAMUSCULAR | Status: DC | PRN

## 2024-02-15 MED ORDER — ONDANSETRON HCL 4 MG PO TABS: 4.0000 mg | ORAL_TABLET | Freq: Four times a day (QID) | ORAL | Status: DC | PRN

## 2024-02-15 MED ORDER — PANTOPRAZOLE SODIUM 40 MG PO TBEC
40.0000 mg | DELAYED_RELEASE_TABLET | Freq: Every day | ORAL | Status: DC
  Administered 2024-02-15: 40 mg via ORAL
  Filled 2024-02-15: qty 1

## 2024-02-15 MED ORDER — CARBOXYMETHYLCELLULOSE SODIUM 0.5 % OP SOLN: 1.0000 [drp] | Freq: Every day | OPHTHALMIC | Status: DC

## 2024-02-15 NOTE — H&P (Signed)
 History and Physical    Gina [name] FMW:[id number] DOB: 1932/05/17 DOA: 02/14/2024  PCP: Teresa Thersia Jansky, MD   Patient coming from: SNF   Chief Complaint: Somnolent, confused, hypotensive  HPI: Gina [name] is an 89 y.o. 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  She is accompanied by her daughter who helps with the history.  Patient was discharged from hospital on 02/02/2024 and seem to be improving at her SNF until she became more somnolent and confused again over the past couple days, had an episode of vomiting this morning, and has not been eating or drinking all day.  She was started on Macrobid yesterday for possible UTI and was found to have SBP in the 80s today.  She reports feeling cold but has been afebrile.  She denies any abdominal pain or chest pain and has not been coughing.  ED Course: Upon arrival to the ED, patient is found to be afebrile and saturating well on room air with mild tachypnea, normal HR, and stable BP.  Labs are most notable for creatinine 1.05, WBC 15,800, lactic acid 2.8, and normal hemoglobin.  CT is concerning for left renal mass with tumor thrombus extending into a branch of the left renal vein highly concerning for renal cell carcinoma.  Urology (Dr. Selma) was consulted by the ED physician and recommended outpatient follow-up.  Blood cultures were collected and the patient was given 1.5 L NS.  Review of Systems:  ROS limited by patient's clinical condition.  Past Medical History:  Diagnosis Date   CKD (chronic kidney disease) stage 3, GFR 30-59 ml/min (HCC)    GERD (gastroesophageal reflux disease)    Hypertension    Pneumonia     Past Surgical History:  Procedure Laterality Date   FOOT SURGERY     Partial removal right  great toe   Right knee surgery     SHOULDER SURGERY Right    TOTAL KNEE ARTHROPLASTY Left 12/31/2023   Procedure: ARTHROPLASTY, KNEE, TOTAL;  Surgeon: Melodi Lerner, MD;  Location: [facility];  Service: Orthopedics;  Laterality: Left;    Social History:   reports that she has never smoked. She has never used smokeless tobacco. She reports that she does not drink alcohol  and does not use drugs.  Allergies  Allergen Reactions   Codeine Other (See Comments)    Insomnia    Strawberry Extract Hives    Family History  Problem Relation Age of Onset   Heart disease Mother    Cataracts Mother    Lung cancer Father      Prior to Admission medications   Medication Sig Start Date End Date Taking? Authorizing Provider  acetaminophen  (TYLENOL ) 500 MG tablet Take 1,000 mg by mouth every 8 (eight) hours as needed for moderate pain (pain score 4-6).   Yes [provider]  aspirin  81 MG chewable tablet Chew 1 tablet (81 mg total) by mouth 2 (two) times daily for 21 days. Then take one 81 mg aspirin  once a day for three weeks. Then discontinue aspirin . 01/01/24 02/18/24 Yes Edmisten, Kristie L, PA  carboxymethylcellulose (REFRESH PLUS) 0.5 % SOLN Place 1 drop into both eyes daily.   Yes [provider]  divalproex (DEPAKOTE) 125 MG DR tablet Take 125 mg by mouth every evening.   Yes  [provider]  hydroxypropyl methylcellulose / hypromellose (ISOPTO TEARS / GONIOVISC) 2.5 % ophthalmic solution Place 2 drops into the right eye 3 (three) times daily.   Yes [provider]  metoprolol succinate (TOPROL-XL) 25 MG 24 hr tablet Take 25 mg by mouth daily.   Yes [provider]  nitrofurantoin, macrocrystal-monohydrate, (MACROBID) 100 MG capsule Take 100 mg by mouth daily.   Yes [provider]  omeprazole (PRILOSEC) 20 MG capsule Take 20 mg by mouth daily. 04/05/20  Yes [provider]  traMADol  (ULTRAM ) 50 MG tablet Take 1-2 tablets (50-100 mg  total) by mouth every 6 (six) hours as needed for moderate pain (pain score 4-6). Patient taking differently: Take 50-100 mg by mouth every 8 (eight) hours as needed for moderate pain (pain score 4-6). 01/01/24  Yes Edmisten, Kristie L, PA  valsartan  (DIOVAN ) 40 MG tablet Take 1 tablet (40 mg total) by mouth daily. Hold if sbp less than 100 Patient taking differently: Take 20 mg by mouth daily. 11/24/21 12/31/23  Jerri Keys, MD    Physical Exam: Vitals:   02/14/24 1800 02/14/24 1900 02/14/24 2127 02/14/24 2130  BP: (!) 106/53 115/63  128/69  Pulse: 75 76  84  Resp: (!) 24 12  18   Temp:   98 F (36.7 C)   TempSrc:   Oral   SpO2: 95% 97%  95%  Weight:      Height:        Constitutional: NAD, no pallor or diaphoresis   Eyes: PERTLA, lids and conjunctivae normal ENMT: Mucous membranes are moist. Posterior pharynx clear of any exudate or lesions.   Neck: supple, no masses  Respiratory: no wheezing, no crackles. No accessory muscle use.  Cardiovascular: S1 & S2 heard, regular rate and rhythm. No JVD. Abdomen: No tenderness, soft. Bowel sounds active.  Musculoskeletal: no clubbing / cyanosis. No joint deformity upper and lower extremities.   Skin: no significant rashes, lesions, ulcers. Warm, dry, well-perfused. Neurologic: CN 2-12 grossly intact aside from gross hearing loss. Moving all extremities. Somnolent. Wakes to voice. Oriented to person and knows she is in a hospital.  Psychiatric: Calm. Cooperative.    Labs and Imaging on Admission: I have personally reviewed following labs and imaging studies  CBC: Recent Labs  Lab 02/14/24 1748  WBC 15.8*  NEUTROABS 15.1*  HGB 12.4  HCT 37.6  MCV 101.6*  PLT 194   Basic Metabolic Panel: Recent Labs  Lab 02/14/24 1749  NA 138  K 4.0  CL 104  CO2 25  GLUCOSE 98  BUN 19  CREATININE 1.05*  CALCIUM 9.9   GFR: Estimated Creatinine Clearance: 32.7 mL/min (A) (by C-G formula based on SCr of 1.05 mg/dL (H)). Liver Function  Tests: Recent Labs  Lab 02/14/24 1749  AST 20  ALT 7  ALKPHOS 92  BILITOT 1.0  PROT 6.0*  ALBUMIN 3.4*   No results for input(s): LIPASE, AMYLASE in the last 168 hours. No results for input(s): AMMONIA in the last 168 hours. Coagulation Profile: Recent Labs  Lab 02/14/24 1749  INR 1.1   Cardiac Enzymes: No results for input(s): CKTOTAL, CKMB, CKMBINDEX, TROPONINI in the last 168 hours. BNP (last 3 results) No results for input(s): PROBNP in the last 8760 hours. HbA1C: No results for input(s): HGBA1C in the last 72 hours. CBG: No results for input(s): GLUCAP in the last 168 hours. Lipid Profile: No results for input(s): CHOL, HDL, LDLCALC, TRIG, CHOLHDL, LDLDIRECT in the last 72 hours. Thyroid  Function Tests: No results for input(s): TSH, T4TOTAL, FREET4, T3FREE, THYROIDAB in the last 72 hours. Anemia Panel: No results for input(s): VITAMINB12, FOLATE, FERRITIN, TIBC, IRON, RETICCTPCT in the last 72 hours. Urine analysis:    Component Value Date/Time   COLORURINE YELLOW 02/14/2024 1922   APPEARANCEUR CLEAR 02/14/2024 1922   LABSPEC 1.008 02/14/2024 1922   PHURINE 5.0 02/14/2024 1922   GLUCOSEU NEGATIVE 02/14/2024 1922   HGBUR NEGATIVE 02/14/2024 1922   BILIRUBINUR NEGATIVE 02/14/2024 1922   KETONESUR NEGATIVE 02/14/2024 1922   PROTEINUR NEGATIVE 02/14/2024 1922   UROBILINOGEN 0.2 07/11/2010 1400   NITRITE NEGATIVE 02/14/2024 1922   LEUKOCYTESUR NEGATIVE 02/14/2024 1922   Sepsis Labs: @LABRCNTIP (procalcitonin:4,lacticidven:4) )No results found for this or any previous visit (from the past 240 hours).   Radiological Exams on Admission: CT Angio Chest PE W and/or Wo Contrast Result Date: 02/14/2024 EXAM: CTA CHEST PE WITHOUT AND WITH CONTRAST CT ABDOMEN AND PELVIS WITH CONTRAST 02/14/2024 09:00:45 PM TECHNIQUE: CTA of the chest was performed after the administration of 100 mL of iohexol  (OMNIPAQUE ) 350 MG/ML  injection. Multiplanar reformatted images are provided for review. MIP images are provided for review. CT of the abdomen and pelvis was performed with the administration of intravenous contrast. Automated exposure control, iterative reconstruction, and/or weight based adjustment of the mA/kV was utilized to reduce the radiation dose to as low as reasonably achievable. COMPARISON: Comparison with same day chest x-ray and CT chest 11/21/2021. CLINICAL HISTORY: Pulmonary embolism (PE) suspected, high prob. Acute um localized abdominal pain. Cough. FINDINGS: CHEST: PULMONARY ARTERIES: Pulmonary arteries are adequately opacified for evaluation. No intraluminal filling defect to suggest pulmonary embolism. Main pulmonary artery is normal in caliber. MEDIASTINUM: No mediastinal lymphadenopathy. The heart and pericardium demonstrate no acute abnormality. Coronary artery and aortic atherosclerotic calcification. There is no acute abnormality of the thoracic aorta. LUNGS AND PLEURA: Bronchial wall thickening with mucus plugging and bronchiectasis/bronchiolectasis in the lower lobes. No focal consolidation or pulmonary edema. No pleural effusion or pneumothorax. SOFT TISSUES AND BONES: No acute bone or soft tissue abnormality. ABDOMEN AND PELVIS: LIVER: The liver is unremarkable. GALLBLADDER AND BILE DUCTS: Distended gallbladder. No biliary ductal dilatation. SPLEEN: Spleen demonstrates no acute abnormality. 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 No pancreatic ductal dilation. ADRENAL GLANDS: Stable left adrenal adenoma. KIDNEYS, URETERS AND BLADDER: Punctate nonobstructing bilateral nephrolithiasis. Heterogeneously enhancing mass in the lower pole of the left kidney measures 4.7 cm. Tumor thrombus extends into an inferior branch of the left renal vein. No hydronephrosis. No perinephric or periureteral stranding. Urinary bladder is unremarkable. GI AND BOWEL:  Diverticulosis without diverticulitis. Stomach and duodenal sweep demonstrate no acute abnormality. There is no bowel obstruction. No abnormal bowel wall thickening or distension. REPRODUCTIVE: 5.7 cm fibroid in the uterus. PERITONEUM AND RETROPERITONEUM: No ascites or free air. LYMPH NODES: No lymphadenopathy. BONES AND SOFT TISSUES: No acute abnormality of the visualized bones. No focal soft tissue abnormality. IMPRESSION: 1. No pulmonary embolism. 2. Findings consistent with chronic bronchitis. 3. 4.7 cm enhancing mass in the lower pole of the left kidney highly suspicious for renal cell carcinoma. There is tumor thrombus extending into an inferior branch of the left renal vein; recommend urology consultation. 4. 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 Follow up as appropriate given age and comorbidities. Electronically signed by: Norman Gatlin MD 02/14/2024 09:22 PM EST RP Workstation: [id number]   CT ABDOMEN  PELVIS W CONTRAST Result Date: 02/14/2024 EXAM: CTA CHEST PE WITHOUT AND WITH CONTRAST CT ABDOMEN AND PELVIS WITH CONTRAST 02/14/2024 09:00:45 PM TECHNIQUE: CTA of the chest was performed after the administration of 100 mL of iohexol  (OMNIPAQUE ) 350 MG/ML injection. Multiplanar reformatted images are provided for review. MIP images are provided for review. CT of the abdomen and pelvis was performed with the administration of intravenous contrast. Automated exposure control, iterative reconstruction, and/or weight based adjustment of the mA/kV was utilized to reduce the radiation dose to as low as reasonably achievable. COMPARISON: Comparison with same day chest x-ray and CT chest 11/21/2021. CLINICAL HISTORY: Pulmonary embolism (PE) suspected, high prob. Acute um localized abdominal pain. Cough. FINDINGS: CHEST: PULMONARY ARTERIES: Pulmonary arteries are adequately opacified for evaluation. No intraluminal filling  defect to suggest pulmonary embolism. Main pulmonary artery is normal in caliber. MEDIASTINUM: No mediastinal lymphadenopathy. The heart and pericardium demonstrate no acute abnormality. Coronary artery and aortic atherosclerotic calcification. There is no acute abnormality of the thoracic aorta. LUNGS AND PLEURA: Bronchial wall thickening with mucus plugging and bronchiectasis/bronchiolectasis in the lower lobes. No focal consolidation or pulmonary edema. No pleural effusion or pneumothorax. SOFT TISSUES AND BONES: No acute bone or soft tissue abnormality. ABDOMEN AND PELVIS: LIVER: The liver is unremarkable. GALLBLADDER AND BILE DUCTS: Distended gallbladder. No biliary ductal dilatation. SPLEEN: Spleen demonstrates no acute abnormality. 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 No pancreatic ductal dilation. ADRENAL GLANDS: Stable left adrenal adenoma. KIDNEYS, URETERS AND BLADDER: Punctate nonobstructing bilateral nephrolithiasis. Heterogeneously enhancing mass in the lower pole of the left kidney measures 4.7 cm. Tumor thrombus extends into an inferior branch of the left renal vein. No hydronephrosis. No perinephric or periureteral stranding. Urinary bladder is unremarkable. GI AND BOWEL: Diverticulosis without diverticulitis. Stomach and duodenal sweep demonstrate no acute abnormality. There is no bowel obstruction. No abnormal bowel wall thickening or distension. REPRODUCTIVE: 5.7 cm fibroid in the uterus. PERITONEUM AND RETROPERITONEUM: No ascites or free air. LYMPH NODES: No lymphadenopathy. BONES AND SOFT TISSUES: No acute abnormality of the visualized bones. No focal soft tissue abnormality. IMPRESSION: 1. No pulmonary embolism. 2. Findings consistent with chronic bronchitis. 3. 4.7 cm enhancing mass in the lower pole of the left kidney highly suspicious for renal cell carcinoma. There is tumor thrombus extending into an inferior branch of the left  renal vein; recommend urology consultation. 4. 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 Follow up as appropriate given age and comorbidities. Electronically signed by: Norman Gatlin MD 02/14/2024 09:22 PM EST RP Workstation: [id number]   DG Chest Portable 1 View Result Date: 02/14/2024 CLINICAL DATA:  Cough. EXAM: PORTABLE CHEST 1 VIEW COMPARISON:  Chest recommended 04/27/2022. FINDINGS: Shallow inspiration. No focal consolidation, pleural effusion or pneumothorax. The cardiac silhouette is within limits. Atherosclerotic calcification of the aorta. No acute osseous pathology. Partially visualized right shoulder arthroplasty. IMPRESSION: No active disease. Electronically Signed   By: Vanetta Chou M.D.   On: 02/14/2024 18:03    EKG: Independently reviewed. Sinus rhythm, PVCs.   Assessment/Plan   1. Acute encephalopathy  - Somnolent and intermittently confused; was delirious during recent hospitalization and there was concern for underlying dementia  - Hold Depakote (she has recently been taking this for insomnia and delirium) and tramadol , check ammonia, TSH, B12, and RPR, use delirium precautions, follow blood cultures, consider brain MRI    2. Left renal mass  -  Left renal mass and tumor thrombus extending into a branch of the renal vein noted on CT in ED and highly concerning for [facility]  - Urology recommends outpatient follow-up; patient's family aware but have asked that the patient not be notified quite yet   3. Lactic acidosis  - No apparent infectious process on broad ED workup  - She has not been eating or drinking and this is likely due to hypovolemia  - Continue IVF hydration and repeat lactic acid    4. Hypotension  - SBP was 80s with EMS, normalized with IVF prior to arrival  - Hold ARB, continue IVF hydration    5. SVT  - Continue Toprol as BP allows    6. CKD 3A  - Renally-dose  medications     DVT prophylaxis: sq heparin  Code Status: DNR/DNI  Level of Care: Level of care: Progressive Family Communication: Daughter at bedside  Disposition Plan:  Patient is from: SNF  Anticipated d/c is to: SNF  Anticipated d/c date is: Possibly as early as 02/15/24  Patient currently: Pending improved mental status, clinical stability   Consults called: None  Admission status: Observation     Evalene GORMAN Sprinkles, MD [facility]  02/15/2024, 12:11 AM

## 2024-02-15 NOTE — Discharge Instructions (Signed)
 1. Follow up with your primary care provider in 1-2 weeks following discharge from hospital.  2. Outpatient referral made to urology and oncology

## 2024-02-15 NOTE — Progress Notes (Addendum)
 CSW spoke with Gina [name] who informed pt is able to return today before midnight. Discussed with daughter and informed pt will return and they have the option to appeal discharge if they do not wish that pt is ready to return home. Daughter verbalized understanding and thanked this clinical research associate. EDP and RN made aware via secure chat. No further ICM needs.

## 2024-02-15 NOTE — ED Notes (Signed)
 Daughter wants to talk to provider why patient not getting admitted. Per provider patient stable and can go back to facility.

## 2024-02-15 NOTE — Progress Notes (Addendum)
 CSW spoke with Dwayne at [employer] who reports pt will need a new auth before returning. CSW notified EDP and RN. Orders placed for PT/OT.  Addend @ 2:21PM Attempted to contact pt's daughter without success. Left HIPAA Compliant voicemail requesting call back.

## 2024-02-15 NOTE — Progress Notes (Signed)
 PROGRESS NOTE    Gina [name]  FMW:[id number] DOB: 1932/07/30 DOA: 02/14/2024 PCP: Teresa Thersia Jansky, MD  Subjective: Pt seen and examined. Chart reviewed. Sent to ER from SNF for AMS and hypotension. Recent admission and discharge from [facility] (Oct 21 - Oct 25) for UTI, sepsis, PSVT.  Spoke at length with pt's dtr janet harrelson.  Patient is now alert and oriented x 4 at baseline according to the daughter.  Patient had been placed on Lopressor for PSVT while she was at [facility].  Patient is also been refusing her losartan while she has been at the nursing home.  Daughter states the patient does not eat and drink well.  She was hypotensive on arrival and with EMS.  She has been given 2 L of fluid and her mentation is now back at baseline.  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  Daughter wants to tell her herself.  She wants a referral to [facility] for outpatient follow-up.  Patient herself wants to be discharged to back to the facility.  Daughter is in agreement.   Hospital Course: CC: AMS, hypotension HPI: Gina [name] is an 89 y.o. 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   She is accompanied by her daughter who helps with the history.  Patient was discharged from hospital on 02/02/2024 and seem to be improving at her SNF until she became more somnolent and confused again over the past couple days, had an episode of vomiting this morning, and has not been eating or drinking all day.  She was started on Macrobid yesterday for possible UTI and was found to have SBP in the 80s today.  She reports feeling cold but has been afebrile.  She denies any abdominal pain or chest pain and has not been  coughing.   ED Course: Upon arrival to the ED, patient is found to be afebrile and saturating well on room air with mild tachypnea, normal HR, and stable BP.  Labs are most notable for creatinine 1.05, WBC 15,800, lactic acid 2.8, and normal hemoglobin.  CT is concerning for left renal mass with tumor thrombus extending into a branch of the left renal vein highly concerning for renal cell carcinoma.   Urology (Dr. Selma) was consulted by the ED physician and recommended outpatient follow-up.  Blood cultures were collected and the patient was given 1.5 L NS.  Significant Events: Admitted 02/14/2024 for acute metabolic encephalopathy and left renal mass   Admission Labs: Lactic acid WBC 15.8, HgB 12.4, plt 194 INR 1.1 Na 138, K 4.0, CO2 of 25, BUN 19, Scr 1.05, glu 98 T. Prot 6.0, alb 3.4, AST 20, ALT 7, alk phos 92, t. Bili 1.0 UA Negative  Admission Imaging Studies: CXR No active disease  CT abd/pelvis, CTPA No pulmonary embolism. 2. Findings consistent with chronic bronchitis. 3. 4.7 cm enhancing mass in the lower pole of the left kidney highly suspicious for renal cell carcinoma. There is tumor thrombus extending into an inferior branch of the left renal vein; recommend urology consultation. 4. 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 Follow up as appropriate given age and comorbidities  Significant Labs:   Significant Imaging Studies:  Antibiotic Therapy: Anti-infectives (From admission, onward)    None       Procedures:   Consultants:     Assessment and Plan: * Acute metabolic encephalopathy 02/15/24 unclear the cause. Not from sepsis. Mostly likely from hypotension, dehydration. Started On IVF. Sent from SNF with altered metal status. Reportedly at baseline she is AxOx4, but only oriented x 2 yesterday in ER.  Mentation is back to baseline now.  Stop IV fluids.  Stable for  discharge back.  I think her leukocytosis is due to stress from her hypotension and not from infection.   Left renal mass 02/15/24 case discussed with urology(Dr. Selma) by EDP. Pt will need to f/u with urology as outpatient for further workup.   SIRS (systemic inflammatory response syndrome) (HCC) 02/15/24 present on admission. Fulfills criteria with lactic acid of 2.0, WBC 15.8, HR 109. No evidence of infection. Sepsis ruled out. Not on any abx.   Hypotension 02/15/24 not from sepsis. Due to SIRS. On IVF. BP improved. Stop lopressor. Was on it after 01-2024 admissions at [facility] due to PSVT from her UTI.  Now resolved.  I do not think she needs to be on Lopressor anymore.  Will also stop her losartan.   Lactic acidosis-resolved as of 02/15/2024 02/15/24 unclear the cause. Likely due to hypovolemia. Started On IVF. Repeat lactic acid is normal. Resolved.   DNR (do not resuscitate)   CKD stage 3a, GFR 45-59 ml/min (HCC) - baseline Scr 1-1.2 02/15/24 baseline Scr 1-1.2    DVT prophylaxis: heparin injection 5,000 Units Start: 02/15/24 0600    Code Status: Limited: Do not attempt resuscitation (DNR) -DNR-LIMITED -Do Not Intubate/DNI  Family Communication: discussed with dtr [name] by phone Disposition Plan: return to SNF Reason for continuing need for hospitalization: stable for DC.  Objective: Vitals:   02/15/24 0502 02/15/24 0819 02/15/24 0821 02/15/24 0900  BP: 111/69 111/86 111/86 (!) 107/45  Pulse: 91 80 74 75  Resp: (!) 22 19 (!) 21 (!) 27  Temp:  98.1 F (36.7 C)    TempSrc:  Oral    SpO2: 95% 96% 93% 97%  Weight:      Height:        Intake/Output Summary (Last 24 hours) at 02/15/2024 1051 Last data filed at 02/15/2024 0953 Gross per 24 hour  Intake 1003 ml  Output --  Net 1003 ml   Filed Weights   02/14/24 1734  Weight: 68 kg    Examination:  Physical Exam Vitals and nursing note reviewed.  Constitutional:      General: She is not in acute  distress.    Appearance: She is not toxic-appearing.     Comments: Thin and frail  HENT:     Head: Normocephalic and atraumatic.  Cardiovascular:     Rate and Rhythm: Normal rate and regular rhythm.  Pulmonary:     Effort: Pulmonary effort is normal.     Breath sounds: Normal breath sounds.  Abdominal:     General: Bowel sounds are normal. There is no distension.     Palpations: Abdomen is soft.     Tenderness: There is no abdominal tenderness.  Musculoskeletal:     Right lower leg: No edema.     Left lower leg: No edema.  Skin:    General: Skin is warm and dry.     Capillary Refill: Capillary refill takes less than 2 seconds.  Neurological:     General: No focal deficit present.  Mental Status: She is alert and oriented to person, place, and time.     Data Reviewed: I have personally reviewed following labs and imaging studies  CBC: Recent Labs  Lab 02/14/24 1748 02/15/24 0527  WBC 15.8* 17.1*  NEUTROABS 15.1*  --   HGB 12.4 11.5*  HCT 37.6 36.1  MCV 101.6* 101.1*  PLT 194 168   Basic Metabolic Panel: Recent Labs  Lab 02/14/24 1749 02/15/24 0527  NA 138 136  K 4.0 3.5  CL 104 106  CO2 25 21*  GLUCOSE 98 85  BUN 19 17  CREATININE 1.05* 0.86  CALCIUM 9.9 8.9  MG  --  1.5*  PHOS  --  2.4*   GFR: Estimated Creatinine Clearance: 39.9 mL/min (by C-G formula based on SCr of 0.86 mg/dL). Liver Function Tests: Recent Labs  Lab 02/14/24 1749  AST 20  ALT 7  ALKPHOS 92  BILITOT 1.0  PROT 6.0*  ALBUMIN 3.4*    Recent Labs  Lab 02/15/24 0527  AMMONIA 33   Coagulation Profile: Recent Labs  Lab 02/14/24 1749  INR 1.1   Thyroid Function Tests: Recent Labs    02/15/24 0527  TSH 0.468   Anemia Panel: Recent Labs    02/15/24 0527  VITAMINB12 313   Sepsis Labs: Recent Labs  Lab 02/14/24 1806 02/14/24 2143 02/15/24 0527  LATICACIDVEN 2.0* 2.8* 1.4    Recent Results (from the past 240 hours)  Culture, blood (Routine x 2)     Status:  None (Preliminary result)   Collection Time: 02/14/24  5:49 PM   Specimen: BLOOD  Result Value Ref Range Status   Specimen Description   Final    BLOOD BLOOD RIGHT ARM Performed at [facility], [address], [place], [place] [address]    Special Requests   Final    BOTTLES DRAWN AEROBIC AND ANAEROBIC Blood Culture adequate volume Performed at [facility], [address] [place], [place] [address]    Culture   Final    NO GROWTH < 12 HOURS Performed at [facility], [address], [place], [place] [address]    Report Status PENDING  Incomplete     Radiology Studies: CT Angio Chest PE W and/or Wo Contrast Result Date: 02/14/2024 EXAM: CTA CHEST PE WITHOUT AND WITH CONTRAST CT ABDOMEN AND PELVIS WITH CONTRAST 02/14/2024 09:00:45 PM TECHNIQUE: CTA of the chest was performed after the administration of 100 mL of iohexol  (OMNIPAQUE ) 350 MG/ML injection. Multiplanar reformatted images are provided for review. MIP images are provided for review. CT of the abdomen and pelvis was performed with the administration of intravenous contrast. Automated exposure control, iterative reconstruction, and/or weight based adjustment of the mA/kV was utilized to reduce the radiation dose to as low as reasonably achievable. COMPARISON: Comparison with same day chest x-ray and CT chest 11/21/2021. CLINICAL HISTORY: Pulmonary embolism (PE) suspected, high prob. Acute um localized abdominal pain. Cough. FINDINGS: CHEST: PULMONARY ARTERIES: Pulmonary arteries are adequately opacified for evaluation. No intraluminal filling defect to suggest pulmonary embolism. Main pulmonary artery is normal in caliber. MEDIASTINUM: No mediastinal lymphadenopathy. The heart and pericardium demonstrate no acute abnormality. Coronary artery and aortic atherosclerotic calcification. There is no acute abnormality of the thoracic aorta. LUNGS AND PLEURA: Bronchial wall thickening with mucus  plugging and bronchiectasis/bronchiolectasis in the lower lobes. No focal consolidation or pulmonary edema. No pleural effusion or pneumothorax. SOFT TISSUES AND BONES: No acute bone or soft tissue abnormality. ABDOMEN AND PELVIS:  LIVER: The liver is unremarkable. GALLBLADDER AND BILE DUCTS: Distended gallbladder. No biliary ductal dilatation. SPLEEN: Spleen demonstrates no acute abnormality. 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 No pancreatic ductal dilation. ADRENAL GLANDS: Stable left adrenal adenoma. KIDNEYS, URETERS AND BLADDER: Punctate nonobstructing bilateral nephrolithiasis. Heterogeneously enhancing mass in the lower pole of the left kidney measures 4.7 cm. Tumor thrombus extends into an inferior branch of the left renal vein. No hydronephrosis. No perinephric or periureteral stranding. Urinary bladder is unremarkable. GI AND BOWEL: Diverticulosis without diverticulitis. Stomach and duodenal sweep demonstrate no acute abnormality. There is no bowel obstruction. No abnormal bowel wall thickening or distension. REPRODUCTIVE: 5.7 cm fibroid in the uterus. PERITONEUM AND RETROPERITONEUM: No ascites or free air. LYMPH NODES: No lymphadenopathy. BONES AND SOFT TISSUES: No acute abnormality of the visualized bones. No focal soft tissue abnormality. IMPRESSION: 1. No pulmonary embolism. 2. Findings consistent with chronic bronchitis. 3. 4.7 cm enhancing mass in the lower pole of the left kidney highly suspicious for renal cell carcinoma. There is tumor thrombus extending into an inferior branch of the left renal vein; recommend urology consultation. 4. 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 Follow up as appropriate given age and comorbidities. Electronically signed by: Norman Gatlin MD 02/14/2024 09:22 PM EST RP Workstation: [id number]   CT ABDOMEN PELVIS W  CONTRAST Result Date: 02/14/2024 EXAM: CTA CHEST PE WITHOUT AND WITH CONTRAST CT ABDOMEN AND PELVIS WITH CONTRAST 02/14/2024 09:00:45 PM TECHNIQUE: CTA of the chest was performed after the administration of 100 mL of iohexol  (OMNIPAQUE ) 350 MG/ML injection. Multiplanar reformatted images are provided for review. MIP images are provided for review. CT of the abdomen and pelvis was performed with the administration of intravenous contrast. Automated exposure control, iterative reconstruction, and/or weight based adjustment of the mA/kV was utilized to reduce the radiation dose to as low as reasonably achievable. COMPARISON: Comparison with same day chest x-ray and CT chest 11/21/2021. CLINICAL HISTORY: Pulmonary embolism (PE) suspected, high prob. Acute um localized abdominal pain. Cough. FINDINGS: CHEST: PULMONARY ARTERIES: Pulmonary arteries are adequately opacified for evaluation. No intraluminal filling defect to suggest pulmonary embolism. Main pulmonary artery is normal in caliber. MEDIASTINUM: No mediastinal lymphadenopathy. The heart and pericardium demonstrate no acute abnormality. Coronary artery and aortic atherosclerotic calcification. There is no acute abnormality of the thoracic aorta. LUNGS AND PLEURA: Bronchial wall thickening with mucus plugging and bronchiectasis/bronchiolectasis in the lower lobes. No focal consolidation or pulmonary edema. No pleural effusion or pneumothorax. SOFT TISSUES AND BONES: No acute bone or soft tissue abnormality. ABDOMEN AND PELVIS: LIVER: The liver is unremarkable. GALLBLADDER AND BILE DUCTS: Distended gallbladder. No biliary ductal dilatation. SPLEEN: Spleen demonstrates no acute abnormality. 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 No pancreatic ductal dilation. ADRENAL GLANDS: Stable left adrenal adenoma. KIDNEYS, URETERS AND BLADDER: Punctate nonobstructing bilateral nephrolithiasis. Heterogeneously  enhancing mass in the lower pole of the left kidney measures 4.7 cm. Tumor thrombus extends into an inferior branch of the left renal vein. No hydronephrosis. No perinephric or periureteral stranding. Urinary bladder is unremarkable. GI AND BOWEL: Diverticulosis without diverticulitis. Stomach and duodenal sweep demonstrate no acute abnormality. There is no bowel obstruction. No abnormal bowel wall thickening or distension. REPRODUCTIVE: 5.7 cm fibroid in the uterus. PERITONEUM AND RETROPERITONEUM: No ascites or free air. LYMPH NODES: No lymphadenopathy. BONES AND SOFT TISSUES: No acute abnormality  of the visualized bones. No focal soft tissue abnormality. IMPRESSION: 1. No pulmonary embolism. 2. Findings consistent with chronic bronchitis. 3. 4.7 cm enhancing mass in the lower pole of the left kidney highly suspicious for renal cell carcinoma. There is tumor thrombus extending into an inferior branch of the left renal vein; recommend urology consultation. 4. 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 Follow up as appropriate given age and comorbidities. Electronically signed by: Norman Gatlin MD 02/14/2024 09:22 PM EST RP Workstation: [id number]   DG Chest Portable 1 View Result Date: 02/14/2024 CLINICAL DATA:  Cough. EXAM: PORTABLE CHEST 1 VIEW COMPARISON:  Chest recommended 04/27/2022. FINDINGS: Shallow inspiration. No focal consolidation, pleural effusion or pneumothorax. The cardiac silhouette is within limits. Atherosclerotic calcification of the aorta. No acute osseous pathology. Partially visualized right shoulder arthroplasty. IMPRESSION: No active disease. Electronically Signed   By: Vanetta Chou M.D.   On: 02/14/2024 18:03    Scheduled Meds:  artificial tears  2 drop Right Eye TID   aspirin   81 mg Oral Daily   heparin  5,000 Units Subcutaneous Q8H   pantoprazole   40 mg Oral QAC breakfast   sodium chloride   flush  3 mL Intravenous Q12H   Continuous Infusions:   LOS: 0 days   Time spent: 55 minutes  Camellia Door, DO  [facility]  02/15/2024, 10:51 AM

## 2024-02-15 NOTE — Evaluation (Signed)
 Occupational Therapy Evaluation Patient Details Name: Gina [name] MRN: [id number] DOB: 21-Apr-1932 Today's Date: 02/15/2024   History of Present Illness   Gina [name] is an 89 yr old female who presented from SNF rehab due to somnolence, confusion, and hypotension. PMH: HTN, GERD, CKD 3A, left knee replacement in September 2025. She was admitted to the hospital ~2 weeks ago for sepsis due to UTI with hypotension, SVT, and delirium     Clinical Impressions The pt is currently presenting with the below listed deficits (see OT problem list). As such, her occupational performance is compromised and she requires assist for self-care management. At current, she requires up to moderate assistance for tasks, such as bathing, lower body dressing, and toileting. During the session today, she required min assist for supine to sit & min assist to stand from the EOB. She was noted to be with fatigue with activity and general deconditioning. She will benefit from further OT services to maximize her independence with self-care tasks and to decrease the risk for restricted participation in meaningful activities. Patient will benefit from continued inpatient follow up therapy, <3 hours/day.      If plan is discharge home, recommend the following:   A lot of help with bathing/dressing/bathroom;Assist for transportation;Assistance with cooking/housework;Help with stairs or ramp for entrance;Direct supervision/assist for medications management;Direct supervision/assist for financial management     Functional Status Assessment   Patient has had a recent decline in their functional status and demonstrates the ability to make significant improvements in function in a reasonable and predictable amount of time.     Equipment Recommendations   Other (comment) (defer to next setting)     Recommendations for Other Services         Precautions/Restrictions   Precautions Precautions:  Fall Restrictions Weight Bearing Restrictions Per Provider Order: No     Mobility Bed Mobility Overal bed mobility: Needs Assistance Bed Mobility: Supine to Sit, Sit to Supine     Supine to sit: Min assist, HOB elevated Sit to supine: Mod assist (required assist for BLE back onto the bed)        Transfers Overall transfer level: Needs assistance Equipment used: 1 person hand held assist Transfers: Sit to/from Stand Sit to Stand: Min assist, From elevated surface           Balance     Sitting balance-Leahy Scale: Fair         Standing balance comment: Min assist for static standing           ADL either performed or assessed with clinical judgement   ADL Overall ADL's : Needs assistance/impaired Eating/Feeding: Independent;Sitting   Grooming: Set up;Sitting   Upper Body Bathing: Set up;Sitting   Lower Body Bathing: Moderate assistance;Sitting/lateral leans   Upper Body Dressing : Minimal assistance;Sitting   Lower Body Dressing: Maximal assistance;Sitting/lateral leans   Toilet Transfer: Minimal assistance;BSC/3in1;Rolling walker (2 wheels);Stand-pivot   Toileting- Architect and Hygiene: Moderate assistance;Sit to/from stand Toileting - Clothing Manipulation Details (indicate cue type and reason): at bedside commode level, based on clinical judgement             Vision Baseline Vision/History: 1 Wears glasses              Pertinent Vitals/Pain Pain Assessment Pain Assessment: No/denies pain     Extremity/Trunk Assessment Upper Extremity Assessment Upper Extremity Assessment: RUE deficits/detail;Generalized weakness;LUE deficits/detail RUE Deficits / Details: Chronic R shoulder AROM limitations (pt's daughter reported a history of shoulder replacement).  Elbow and hand AROM WFL. Grip strength grossly 4-/5 LUE Deficits / Details: AROM WFL. Grip strength grossly 4-/5   Lower Extremity Assessment Lower Extremity Assessment:  Generalized weakness;LLE deficits/detail;RLE deficits/detail RLE Deficits / Details: AROM WFL LLE Deficits / Details: AROM WFL      Communication Communication Communication: Impaired Factors Affecting Communication: Hearing impaired   Cognition Arousal: Alert Behavior During Therapy: WFL for tasks assessed/performed               OT - Cognition Comments: Oriented to person, place, and month. She reported the year to be 1925.                 Following commands: Intact       Cueing  General Comments   Cueing Techniques: Verbal cues;Tactile cues  The pt's daughter was present and very supportive throughout the session           Home Living Family/patient expects to be discharged to:: Private residence Living Arrangements: Alone (The pt lives alone, therefore household layout is based on her home. She has the option to stay at her daughter's home if needed.) Available Help at Discharge: Family Type of Home: House Home Access: Stairs to enter [employer] of Steps: 5 Entrance Stairs-Rails: Left Home Layout: Two level;Able to live on main level with bedroom/bathroom Alternate Level Stairs-Number of Steps: main and upper level to home; she resides on the main level of the home   Bathroom Shower/Tub: Tub/shower unit         Home Equipment: Agricultural Consultant (2 wheels);Cane - single point;BSC/3in1;Tub bench   Additional Comments:  (Patient was brought to the hospital from [facility], where she has been for the past couple weeks. Information reported regarding the patient's prior level of functioning & living situation was obtained mostly from the pt's daughter.)      Prior Functioning/Environment Prior Level of Function : Needs assist             Mobility Comments:  (Prior to her L knee replacement in September, she used a cane for household ambulation & she was unable to ambulate longer distances. She has been using a RW since surgery.) ADLs  Comments:  (Prior to her L knee replacement in September, the pt required assist for spongebathing and occasional assist for toileting hygiene. She could dress herself, though with some difficulty. Her daughter completed the laundry & household cleaning.)    OT Problem List: Decreased strength;Decreased range of motion;Impaired balance (sitting and/or standing);Decreased activity tolerance;Decreased knowledge of use of DME or AE   OT Treatment/Interventions: Therapeutic exercise;Self-care/ADL training;Energy conservation;DME and/or AE instruction;Balance training;Patient/family education;Therapeutic activities      OT Goals(Current goals can be found in the care plan section)   Acute Rehab OT Goals OT Goal Formulation: With patient/family Time For Goal Achievement: 02/29/24 Potential to Achieve Goals: Good ADL Goals Pt Will Perform Upper Body Dressing: with set-up;sitting Pt Will Perform Lower Body Dressing: with supervision;sitting/lateral leans;sit to/from stand Pt Will Transfer to Toilet: with supervision;bedside commode;stand pivot transfer Pt Will Perform Toileting - Clothing Manipulation and hygiene: with supervision;sit to/from stand   OT Frequency:  Min 2X/week       AM-PAC OT 6 Clicks Daily Activity     Outcome Measure Help from another person eating meals?: None Help from another person taking care of personal grooming?: A Little Help from another person toileting, which includes using toliet, bedpan, or urinal?: A Lot Help from another person bathing (including washing, rinsing, drying)?:  A Lot Help from another person to put on and taking off regular upper body clothing?: A Little Help from another person to put on and taking off regular lower body clothing?: A Lot 6 Click Score: 16   End of Session Equipment Utilized During Treatment: Other (comment) (N/A) Nurse Communication: Mobility status  Activity Tolerance: Patient limited by fatigue Patient left: in  bed;with call bell/phone within reach;with family/visitor present  OT Visit Diagnosis: Unsteadiness on feet (R26.81);Other abnormalities of gait and mobility (R26.89);Muscle weakness (generalized) (M62.81)                Time: 8649-8560 OT Time Calculation (min): 49 min Charges:  OT General Charges $OT Visit: 1 Visit OT Evaluation $OT Eval Moderate Complexity: 1 Mod OT Treatments $Therapeutic Activity: 8-22 mins    Gina [name], OTR/L 02/15/2024, 4:45 PM

## 2024-02-15 NOTE — Hospital Course (Addendum)
 CC: AMS, hypotension HPI: Gina [name] is an 89 y.o. 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   She is accompanied by her daughter who helps with the history.  Patient was discharged from hospital on 02/02/2024 and seem to be improving at her SNF until she became more somnolent and confused again over the past couple days, had an episode of vomiting this morning, and has not been eating or drinking all day.  She was started on Macrobid yesterday for possible UTI and was found to have SBP in the 80s today.  She reports feeling cold but has been afebrile.  She denies any abdominal pain or chest pain and has not been coughing.   ED Course: Upon arrival to the ED, patient is found to be afebrile and saturating well on room air with mild tachypnea, normal HR, and stable BP.  Labs are most notable for creatinine 1.05, WBC 15,800, lactic acid 2.8, and normal hemoglobin.  CT is concerning for left renal mass with tumor thrombus extending into a branch of the left renal vein highly concerning for renal cell carcinoma.   Urology (Dr. Selma) was consulted by the ED physician and recommended outpatient follow-up.  Blood cultures were collected and the patient was given 1.5 L NS.  Significant Events: Admitted 02/14/2024 for acute metabolic encephalopathy and left renal mass   Admission Labs: Lactic acid WBC 15.8, HgB 12.4, plt 194 INR 1.1 Na 138, K 4.0, CO2 of 25, BUN 19, Scr 1.05, glu 98 T. Prot 6.0, alb 3.4, AST 20, ALT 7, alk phos 92, t. Bili 1.0 UA Negative  Admission Imaging Studies: CXR No active disease  CT abd/pelvis, CTPA No pulmonary embolism. 2. Findings consistent with chronic bronchitis. 3. 4.7 cm enhancing mass in the lower pole of the left kidney highly suspicious for  renal cell carcinoma. There is tumor thrombus extending into an inferior branch of the left renal vein; recommend urology consultation. 4. 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 Follow up as appropriate given age and comorbidities  Significant Labs:   Significant Imaging Studies:   Antibiotic Therapy: Anti-infectives (From admission, onward)    None       Procedures:   Consultants:

## 2024-02-15 NOTE — Discharge Summary (Signed)
 [facility] Physician Discharge Summary   Patient name: Gina [name]  Admit date:     02/14/2024  Discharge date: 02/15/2024  Attending Physician: CHARLTON EVALENE RAMAN [[id number]]  Discharge Physician: Camellia Door   PCP: Teresa Thersia Jansky, MD  Admitted From: SNF [facility]  Disposition:  [facility]  Recommendations for Outpatient Follow-up:  Follow up with PCP in 1-2 weeks Outpatient referral made to urology and heme/onc  Home Health:No Equipment/Devices: None    Discharge Condition:Stable CODE STATUS:DNR/DNI Diet recommendation: Heart Healthy Fluid Restriction: None  Hospital Summary: CC: AMS, hypotension HPI: Gina [name] is an 89 y.o. 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   She is accompanied by her daughter who helps with the history.  Patient was discharged from hospital on 02/02/2024 and seem to be improving at her SNF until she became more somnolent and confused again over the past couple days, had an episode of vomiting this morning, and has not been eating or drinking all day.  She was started on Macrobid yesterday for possible UTI and was found to have SBP in the 80s today.  She reports feeling cold but has been afebrile.  She denies any abdominal pain or chest pain and has not been coughing.   ED Course: Upon arrival to the ED, patient is found to be afebrile and saturating well on room air with mild tachypnea, normal HR, and stable BP.  Labs are most notable for creatinine 1.05, WBC 15,800, lactic acid 2.8, and normal hemoglobin.  CT is concerning for left renal mass with tumor thrombus extending into a branch of the left renal vein highly concerning for renal cell carcinoma.   Urology (Dr. Selma) was consulted by the ED physician and  recommended outpatient follow-up.  Blood cultures were collected and the patient was given 1.5 L NS.  Significant Events: Admitted 02/14/2024 for acute metabolic encephalopathy and left renal mass   Admission Labs: Lactic acid WBC 15.8, HgB 12.4, plt 194 INR 1.1 Na 138, K 4.0, CO2 of 25, BUN 19, Scr 1.05, glu 98 T. Prot 6.0, alb 3.4, AST 20, ALT 7, alk phos 92, t. Bili 1.0 UA Negative  Admission Imaging Studies: CXR No active disease  CT abd/pelvis, CTPA No pulmonary embolism. 2. Findings consistent with chronic bronchitis. 3. 4.7 cm enhancing mass in the lower pole of the left kidney highly suspicious for renal cell carcinoma. There is tumor thrombus extending into an inferior branch of the left renal vein; recommend urology consultation. 4. 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 Follow up as appropriate given age and comorbidities  Significant Labs:   Significant Imaging Studies:   Antibiotic Therapy: Anti-infectives (From admission, onward)    None       Procedures:   Consultants:    Hospital Course by Problem: * Acute metabolic encephalopathy 02/15/24 unclear the cause. Not from sepsis. Mostly likely from hypotension, dehydration. Started On IVF. Sent from SNF with altered metal status. Reportedly at baseline she is AxOx4, but only oriented x 2 yesterday in ER.  Mentation is back to baseline now.  Stop IV fluids.  Stable for discharge back.  I think her leukocytosis is due to stress from her hypotension and not from infection.  Left renal mass 02/15/24 case discussed with urology(Dr. Selma) by EDP. Pt will need to f/u with urology as outpatient for further workup.   SIRS (systemic inflammatory response syndrome) (HCC) 02/15/24 present on admission. Fulfills criteria with lactic acid of 2.0, WBC 15.8, HR 109. No evidence of infection. Sepsis ruled out. Not on any  abx.   Hypotension 02/15/24 not from sepsis. Due to SIRS. On IVF. BP improved. Stop lopressor. Was on it after [facility]-20[facility] admissions at [facility] due to PSVT from her UTI.  Now resolved.  I do not think she needs to be on Lopressor anymore.  Will also stop her losartan.   Lactic acidosis-resolved as of 02/15/2024 02/15/24 unclear the cause. Likely due to hypovolemia. Started On IVF. Repeat lactic acid is normal. Resolved.   DNR (do not resuscitate)   CKD stage 3a, GFR 45-59 ml/min (HCC) - baseline Scr 1-1.2 02/15/24 baseline Scr 1-1.2     Discharge Diagnoses:  Principal Problem:   Acute metabolic encephalopathy Active Problems:   SIRS (systemic inflammatory response syndrome) (HCC)   Left renal mass   Hypotension   CKD stage 3a, GFR 45-59 ml/min (HCC) - baseline Scr 1-1.2   DNR (do not resuscitate)   Discharge Instructions  Discharge Instructions     Ambulatory referral to Hematology / Oncology   Complete by: As directed    Reason for Referral: left renal mass  Has the referral been discussed with the patient?: Yes  Designated contact for the referral if not the patient (name/phone number): janet harrelson(dtr) [phone]   Has the patient seen a specialist for this issue before?: No  If so, who (practice/provider)?  Does the patient have a provider or location preference for the referral?: No Would the patient like to see previous specialist if applicable? NA   Ambulatory referral to Urology   Complete by: As directed    Reason for Referral: left renal mass  Has the referral been discussed with the patient?: Yes  Designated contact for the referral if not the patient (name/phone number): [name] No [phone]   Has the patient seen a specialist for this issue before?: No  If so, who (practice/provider)?  Does the patient have a provider or location preference for the referral?: [facility] Would the patient like to see previous  specialist if applicable? NA   Call MD for:  difficulty breathing, headache or visual disturbances   Complete by: As directed    Call MD for:  extreme fatigue   Complete by: As directed    Call MD for:  hives   Complete by: As directed    Call MD for:  persistant dizziness or light-headedness   Complete by: As directed    Call MD for:  persistant nausea and vomiting   Complete by: As directed    Call MD for:  redness, tenderness, or signs of infection (pain, swelling, redness, odor or green/yellow discharge around incision site)   Complete by: As directed    Call MD for:  severe uncontrolled pain   Complete by: As directed    Call MD for:  temperature >100.4   Complete by: As directed    Diet - low sodium heart healthy   Complete by: As directed    Discharge instructions   Complete by: As directed    1. Follow up with your primary care provider in 1-2 weeks following discharge from hospital. 2. Outpatient referral made to urology and oncology   Increase activity slowly  Complete by: As directed    No wound care   Complete by: As directed       Allergies as of 02/15/2024       Reactions   Codeine Other (See Comments)   Insomnia   Strawberry Extract Hives        Medication List     STOP taking these medications    metoprolol succinate 25 MG 24 hr tablet Commonly known as: TOPROL-XL   nitrofurantoin (macrocrystal-monohydrate) 100 MG capsule Commonly known as: MACROBID   valsartan  40 MG tablet Commonly known as: Diovan        TAKE these medications    acetaminophen  500 MG tablet Commonly known as: TYLENOL  Take 1,000 mg by mouth every 8 (eight) hours as needed for moderate pain (pain score 4-6).   Aspirin  Low Dose 81 MG chewable tablet Generic drug: aspirin  Chew 1 tablet (81 mg total) by mouth 2 (two) times daily for 21 days. Then take one 81 mg aspirin  once a day for three weeks. Then discontinue aspirin .   carboxymethylcellulose 0.5 % Soln Commonly  known as: REFRESH PLUS Place 1 drop into both eyes daily.   divalproex 125 MG DR tablet Commonly known as: DEPAKOTE Take 125 mg by mouth every evening.   hydroxypropyl methylcellulose / hypromellose 2.5 % ophthalmic solution Commonly known as: ISOPTO TEARS / GONIOVISC Place 2 drops into the right eye 3 (three) times daily.   omeprazole 20 MG capsule Commonly known as: PRILOSEC Take 20 mg by mouth daily.   traMADol  50 MG tablet Commonly known as: ULTRAM  Take 1-2 tablets (50-100 mg total) by mouth every 8 (eight) hours as needed for moderate pain (pain score 4-6).        Allergies  Allergen Reactions   Codeine Other (See Comments)    Insomnia    Strawberry Extract Hives    Discharge Exam: Vitals:   02/15/24 0821 02/15/24 0900  BP: 111/86 (!) 107/45  Pulse: 74 75  Resp: (!) 21 (!) 27  Temp:    SpO2: 93% 97%    Physical Exam Vitals and nursing note reviewed.  Constitutional:      General: She is not in acute distress.    Appearance: She is not toxic-appearing.     Comments: Thin and frail  HENT:     Head: Normocephalic and atraumatic.  Cardiovascular:     Rate and Rhythm: Normal rate and regular rhythm.  Pulmonary:     Effort: Pulmonary effort is normal.     Breath sounds: Normal breath sounds.  Abdominal:     General: Bowel sounds are normal. There is no distension.     Palpations: Abdomen is soft.     Tenderness: There is no abdominal tenderness.  Musculoskeletal:     Right lower leg: No edema.     Left lower leg: No edema.  Skin:    General: Skin is warm and dry.     Capillary Refill: Capillary refill takes less than 2 seconds.  Neurological:     General: No focal deficit present.     Mental Status: She is alert and oriented to person, place, and time.     The results of significant diagnostics from this hospitalization (including imaging, microbiology, ancillary and laboratory) are listed below for reference.    Microbiology: Recent Results  (from the past 240 hours)  Culture, blood (Routine x 2)     Status: None (Preliminary result)   Collection Time: 02/14/24  5:49 PM   Specimen: BLOOD  Result Value Ref Range Status   Specimen Description   Final    BLOOD BLOOD RIGHT ARM Performed at [facility], [address] [place], [place] [address]    Special Requests   Final    BOTTLES DRAWN AEROBIC AND ANAEROBIC Blood Culture adequate volume Performed at [facility], [address], [place], [place] [address]    Culture   Final    NO GROWTH < 12 HOURS Performed at [facility], [address] [place], [place] [address]    Report Status PENDING  Incomplete     Labs:  Basic Metabolic Panel: Recent Labs  Lab 02/14/24 1749 02/15/24 0527  NA 138 136  K 4.0 3.5  CL 104 106  CO2 25 21*  GLUCOSE 98 85  BUN 19 17  CREATININE 1.05* 0.86  CALCIUM 9.9 8.9  MG  --  1.5*  PHOS  --  2.4*   Liver Function Tests: Recent Labs  Lab 02/14/24 1749  AST 20  ALT 7  ALKPHOS 92  BILITOT 1.0  PROT 6.0*  ALBUMIN 3.4*    Recent Labs  Lab 02/15/24 0527  AMMONIA 33   CBC: Recent Labs  Lab 02/14/24 1748 02/15/24 0527  WBC 15.8* 17.1*  NEUTROABS 15.1*  --   HGB 12.4 11.5*  HCT 37.6 36.1  MCV 101.6* 101.1*  PLT 194 168   Thyroid function studies Recent Labs    02/15/24 0527  TSH 0.468   Anemia work up Recent Labs    02/15/24 0527  VITAMINB12 313   Urinalysis    Component Value Date/Time   COLORURINE YELLOW 02/14/2024 1922   APPEARANCEUR CLEAR 02/14/2024 1922   LABSPEC 1.008 02/14/2024 1922   PHURINE 5.0 02/14/2024 1922   GLUCOSEU NEGATIVE 02/14/2024 1922   HGBUR NEGATIVE 02/14/2024 1922   BILIRUBINUR NEGATIVE 02/14/2024 1922   KETONESUR NEGATIVE 02/14/2024 1922   PROTEINUR NEGATIVE 02/14/2024 1922   UROBILINOGEN 0.2 07/11/2010 1400   NITRITE NEGATIVE 02/14/2024 1922   LEUKOCYTESUR NEGATIVE 02/14/2024 1922   Sepsis Labs Recent Labs  Lab 02/14/24 1748  02/15/24 0527  WBC 15.8* 17.1*    Procedures/Studies: CT Angio Chest PE W and/or Wo Contrast Result Date: 02/14/2024 EXAM: CTA CHEST PE WITHOUT AND WITH CONTRAST CT ABDOMEN AND PELVIS WITH CONTRAST 02/14/2024 09:00:45 PM TECHNIQUE: CTA of the chest was performed after the administration of 100 mL of iohexol  (OMNIPAQUE ) 350 MG/ML injection. Multiplanar reformatted images are provided for review. MIP images are provided for review. CT of the abdomen and pelvis was performed with the administration of intravenous contrast. Automated exposure control, iterative reconstruction, and/or weight based adjustment of the mA/kV was utilized to reduce the radiation dose to as low as reasonably achievable. COMPARISON: Comparison with same day chest x-ray and CT chest 11/21/2021. CLINICAL HISTORY: Pulmonary embolism (PE) suspected, high prob. Acute um localized abdominal pain. Cough. FINDINGS: CHEST: PULMONARY ARTERIES: Pulmonary arteries are adequately opacified for evaluation. No intraluminal filling defect to suggest pulmonary embolism. Main pulmonary artery is normal in caliber. MEDIASTINUM: No mediastinal lymphadenopathy. The heart and pericardium demonstrate no acute abnormality. Coronary artery and aortic atherosclerotic calcification. There is no acute abnormality of the thoracic aorta. LUNGS AND PLEURA: Bronchial wall thickening with mucus plugging and bronchiectasis/bronchiolectasis in the lower lobes. No focal consolidation or pulmonary edema. No pleural effusion or pneumothorax. SOFT TISSUES AND BONES: No acute bone or soft tissue abnormality. ABDOMEN AND PELVIS: LIVER: The liver is unremarkable. GALLBLADDER AND BILE DUCTS: Distended gallbladder.  No biliary ductal dilatation. SPLEEN: Spleen demonstrates no acute abnormality. 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 No pancreatic ductal dilation. ADRENAL GLANDS: Stable left adrenal adenoma. KIDNEYS,  URETERS AND BLADDER: Punctate nonobstructing bilateral nephrolithiasis. Heterogeneously enhancing mass in the lower pole of the left kidney measures 4.7 cm. Tumor thrombus extends into an inferior branch of the left renal vein. No hydronephrosis. No perinephric or periureteral stranding. Urinary bladder is unremarkable. GI AND BOWEL: Diverticulosis without diverticulitis. Stomach and duodenal sweep demonstrate no acute abnormality. There is no bowel obstruction. No abnormal bowel wall thickening or distension. REPRODUCTIVE: 5.7 cm fibroid in the uterus. PERITONEUM AND RETROPERITONEUM: No ascites or free air. LYMPH NODES: No lymphadenopathy. BONES AND SOFT TISSUES: No acute abnormality of the visualized bones. No focal soft tissue abnormality. IMPRESSION: 1. No pulmonary embolism. 2. Findings consistent with chronic bronchitis. 3. 4.7 cm enhancing mass in the lower pole of the left kidney highly suspicious for renal cell carcinoma. There is tumor thrombus extending into an inferior branch of the left renal vein; recommend urology consultation. 4. 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 Follow up as appropriate given age and comorbidities. Electronically signed by: Norman Gatlin MD 02/14/2024 09:22 PM EST RP Workstation: [id number]   CT ABDOMEN PELVIS W CONTRAST Result Date: 02/14/2024 EXAM: CTA CHEST PE WITHOUT AND WITH CONTRAST CT ABDOMEN AND PELVIS WITH CONTRAST 02/14/2024 09:00:45 PM TECHNIQUE: CTA of the chest was performed after the administration of 100 mL of iohexol  (OMNIPAQUE ) 350 MG/ML injection. Multiplanar reformatted images are provided for review. MIP images are provided for review. CT of the abdomen and pelvis was performed with the administration of intravenous contrast. Automated exposure control, iterative reconstruction, and/or weight based adjustment of the mA/kV was utilized to reduce the radiation dose to as  low as reasonably achievable. COMPARISON: Comparison with same day chest x-ray and CT chest 11/21/2021. CLINICAL HISTORY: Pulmonary embolism (PE) suspected, high prob. Acute um localized abdominal pain. Cough. FINDINGS: CHEST: PULMONARY ARTERIES: Pulmonary arteries are adequately opacified for evaluation. No intraluminal filling defect to suggest pulmonary embolism. Main pulmonary artery is normal in caliber. MEDIASTINUM: No mediastinal lymphadenopathy. The heart and pericardium demonstrate no acute abnormality. Coronary artery and aortic atherosclerotic calcification. There is no acute abnormality of the thoracic aorta. LUNGS AND PLEURA: Bronchial wall thickening with mucus plugging and bronchiectasis/bronchiolectasis in the lower lobes. No focal consolidation or pulmonary edema. No pleural effusion or pneumothorax. SOFT TISSUES AND BONES: No acute bone or soft tissue abnormality. ABDOMEN AND PELVIS: LIVER: The liver is unremarkable. GALLBLADDER AND BILE DUCTS: Distended gallbladder. No biliary ductal dilatation. SPLEEN: Spleen demonstrates no acute abnormality. 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 No pancreatic ductal dilation. ADRENAL GLANDS: Stable left adrenal adenoma. KIDNEYS, URETERS AND BLADDER: Punctate nonobstructing bilateral nephrolithiasis. Heterogeneously enhancing mass in the lower pole of the left kidney measures 4.7 cm. Tumor thrombus extends into an inferior branch of the left renal vein. No hydronephrosis. No perinephric or periureteral stranding. Urinary bladder is unremarkable. GI AND BOWEL: Diverticulosis without diverticulitis. Stomach and duodenal sweep demonstrate no acute abnormality. There is no bowel obstruction. No abnormal bowel wall thickening or distension. REPRODUCTIVE: 5.7 cm fibroid in the uterus. PERITONEUM AND RETROPERITONEUM: No ascites or free air. LYMPH NODES: No lymphadenopathy. BONES AND SOFT TISSUES: No acute  abnormality of the visualized bones. No focal soft tissue abnormality. IMPRESSION:  1. No pulmonary embolism. 2. Findings consistent with chronic bronchitis. 3. 4.7 cm enhancing mass in the lower pole of the left kidney highly suspicious for renal cell carcinoma. There is tumor thrombus extending into an inferior branch of the left renal vein; recommend urology consultation. 4. 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 Follow up as appropriate given age and comorbidities. Electronically signed by: Norman Gatlin MD 02/14/2024 09:22 PM EST RP Workstation: [id number]   DG Chest Portable 1 View Result Date: 02/14/2024 CLINICAL DATA:  Cough. EXAM: PORTABLE CHEST 1 VIEW COMPARISON:  Chest recommended 04/27/2022. FINDINGS: Shallow inspiration. No focal consolidation, pleural effusion or pneumothorax. The cardiac silhouette is within limits. Atherosclerotic calcification of the aorta. No acute osseous pathology. Partially visualized right shoulder arthroplasty. IMPRESSION: No active disease. Electronically Signed   By: Vanetta Chou M.D.   On: 02/14/2024 18:03    Time coordinating discharge: 60 mins  SIGNED:  Camellia Door, DO [facility] 02/15/24, 11:04 AM

## 2024-02-15 NOTE — Progress Notes (Signed)
   Spoke with dtr janet again on the phone. Pt will be assessed by PT/OT prior to DC back to SNF.  Camellia Door, DO [facility]

## 2024-02-15 NOTE — Assessment & Plan Note (Addendum)
 02/15/24 unclear the cause. Likely due to hypovolemia. Started On IVF. Repeat lactic acid is normal. Resolved.

## 2024-02-15 NOTE — ED Notes (Signed)
 RN called PT/OT to expedite process.

## 2024-02-15 NOTE — Assessment & Plan Note (Signed)
 02/15/24 baseline Scr 1-1.2

## 2024-02-15 NOTE — NC FL2 (Signed)
 [place]  MEDICAID FL2 LEVEL OF CARE FORM     IDENTIFICATION  Patient Name: Gina [name] Birthdate: 10-Jan-1933 Sex: female Admission Date (Current Location): 02/14/2024  [facility] and [place] Number:  Producer, Television/film/video and Address:  [facility],  [address] [place], [place] [address]      Provider Number: [id number]  Attending Physician Name and Address:  Laurence Locus, DO  Relative Name and Phone Number:  Geralene Hoose (Daughter)  [phone] [facility])    Current Level of Care: Hospital Recommended Level of Care: Skilled Nursing Facility Prior Approval Number:    Date Approved/Denied:   PASRR Number: [id number]  Discharge Plan: SNF    Current Diagnoses: Patient Active Problem List   Diagnosis Date Noted   Left renal mass 02/15/2024   DNR (do not resuscitate) 02/15/2024   OA (osteoarthritis) of knee 12/31/2023   Primary osteoarthritis of left knee 12/31/2023   CKD stage 3a, GFR 45-59 ml/min (HCC) - baseline Scr 1-1.2 11/21/2021   Left knee pain 11/21/2021   Acute metabolic encephalopathy 11/21/2021   SIRS (systemic inflammatory response syndrome) (HCC) 06/12/2020   Hypotension 06/12/2020   Weakness 06/12/2020    Orientation RESPIRATION BLADDER Height & Weight     Self, Situation, Place  Normal Continent Weight: 149 lb 14.6 oz (68 kg) Height:  5' 5 (165.1 cm)  BEHAVIORAL SYMPTOMS/MOOD NEUROLOGICAL BOWEL NUTRITION STATUS      Continent Diet (Regular)  AMBULATORY STATUS COMMUNICATION OF NEEDS Skin   Limited Assist Verbally Normal                       Personal Care Assistance Level of Assistance  Bathing, Feeding, Dressing Bathing Assistance: Limited assistance Feeding assistance: Independent Dressing Assistance: Limited assistance     Functional Limitations Info  Sight, Hearing, Speech Sight Info: Adequate Hearing Info: Adequate Speech Info: Adequate    SPECIAL CARE FACTORS FREQUENCY  PT (By licensed PT), OT (By licensed OT)      PT Frequency: x5/week OT Frequency: x5/week            Contractures Contractures Info: Not present    Additional Factors Info  Code Status Code Status Info: DNR-Limited             Current Medications (02/15/2024):  This is the current hospital active medication list Current Facility-Administered Medications  Medication Dose Route Frequency Provider Last Rate Last Admin   acetaminophen  (TYLENOL ) tablet 650 mg  650 mg Oral Q6H PRN Opyd, Timothy S, MD       Or   acetaminophen  (TYLENOL ) suppository 650 mg  650 mg Rectal Q6H PRN Opyd, Timothy S, MD       artificial tears ophthalmic solution 1 drop  1 drop Both Eyes PRN Opyd, Timothy S, MD       artificial tears ophthalmic solution 2 drop  2 drop Right Eye TID Opyd, Timothy S, MD       aspirin  chewable tablet 81 mg  81 mg Oral Daily Opyd, Timothy S, MD   81 mg at 02/15/24 0953   heparin injection 5,000 Units  5,000 Units Subcutaneous Q8H Opyd, Timothy S, MD   5,000 Units at 02/15/24 0533   ondansetron  (ZOFRAN ) tablet 4 mg  4 mg Oral Q6H PRN Opyd, Timothy S, MD       Or   ondansetron  (ZOFRAN ) injection 4 mg  4 mg Intravenous Q6H PRN Opyd, Timothy S, MD       pantoprazole  (PROTONIX ) EC tablet 40  mg  40 mg Oral QAC breakfast Opyd, Timothy S, MD   40 mg at 02/15/24 0729   senna-docusate (Senokot-S) tablet 1 tablet  1 tablet Oral QHS PRN Opyd, Timothy S, MD       sodium chloride  flush (NS) 0.9 % injection 3 mL  3 mL Intravenous Q12H Opyd, Timothy S, MD   3 mL at 02/15/24 9046   Current Outpatient Medications  Medication Sig Dispense Refill   acetaminophen  (TYLENOL ) 500 MG tablet Take 1,000 mg by mouth every 8 (eight) hours as needed for moderate pain (pain score 4-6).     aspirin  81 MG chewable tablet Chew 1 tablet (81 mg total) by mouth 2 (two) times daily for 21 days. Then take one 81 mg aspirin  once a day for three weeks. Then discontinue aspirin . 63 tablet 0   carboxymethylcellulose (REFRESH PLUS) 0.5 % SOLN Place 1 drop into both  eyes daily.     divalproex (DEPAKOTE) 125 MG DR tablet Take 125 mg by mouth every evening.     hydroxypropyl methylcellulose / hypromellose (ISOPTO TEARS / GONIOVISC) 2.5 % ophthalmic solution Place 2 drops into the right eye 3 (three) times daily.     omeprazole (PRILOSEC) 20 MG capsule Take 20 mg by mouth daily.     traMADol  (ULTRAM ) 50 MG tablet Take 1-2 tablets (50-100 mg total) by mouth every 8 (eight) hours as needed for moderate pain (pain score 4-6). 30 tablet 0     Discharge Medications: Please see discharge summary for a list of discharge medications.  Relevant Imaging Results:  Relevant Lab Results:   Additional Information SSN:[id number]  Kari JONETTA Daisy, LCSW

## 2024-02-15 NOTE — ED Notes (Signed)
 Per facility they can't accept patient until new assessment has been done. Provider informed, Ordered SW and PT/OT consult.

## 2024-02-15 NOTE — Assessment & Plan Note (Signed)
 02/15/24 case discussed with urology(Dr. Selma) by EDP. Pt will need to f/u with urology as outpatient for further workup.

## 2024-02-15 NOTE — Assessment & Plan Note (Addendum)
 02/15/24 not from sepsis. Due to SIRS. On IVF. BP improved. Stop lopressor. Was on it after 01-2024 admissions at [facility] due to PSVT from her UTI.  Now resolved.  I do not think she needs to be on Lopressor anymore.  Will also stop her losartan.

## 2024-02-15 NOTE — Evaluation (Signed)
 Physical Therapy Evaluation Patient Details Name: Gina [name] MRN: [id number] DOB: 04-Oct-1932 Today's Date: 02/15/2024  History of Present Illness  Gina [name] is an 89 y.o. female presents from her SNF with somnolence, confusion, and hypotension. PMH: HTN, GERD, CKD 3A, left knee replacement in September, and admission 2 weeks ago for sepsis due to UTI with hypotension, SVT, and delirium  Clinical Impression  Pt admitted with above diagnosis. PTA, daughter reports pt has been at [facility] rehab since 10/25 and has been active with therapy, using RW for ambulation, facility assisting with self care. Prior to rehab daughter reports pt was progressing nicely from TKA, lived with daughter. Today on eval, pt pleasantly confused, follows commands, daughter reports pt not at baseline cognitively but significantly improved since presenting to hospital. Pt needing mod A to mobilize to bedside, min A to steady and power to stand with RW from elevated gurney, min A to amb in room with RW with very slow cadence and near shuffling gait pattern. BP detailed below and pt denies dizziness or lightheadedness sensations. Patient will benefit from continued inpatient follow up therapy, <3 hours/day. Pt currently with functional limitations due to the deficits listed below (see PT Problem List). Pt will benefit from acute skilled PT to increase their independence and safety with mobility to allow discharge.           If plan is discharge home, recommend the following: A little help with walking and/or transfers;A little help with bathing/dressing/bathroom;Assistance with cooking/housework;Assist for transportation;Help with stairs or ramp for entrance   Can travel by private vehicle   Yes    Equipment Recommendations None recommended by PT  Recommendations for Other Services       Functional Status Assessment Patient has had a recent decline in their functional status and demonstrates the ability to make  significant improvements in function in a reasonable and predictable amount of time.     Precautions / Restrictions Precautions Precautions: Fall Restrictions Weight Bearing Restrictions Per Provider Order: No      Mobility  Bed Mobility Overal bed mobility: Needs Assistance Bed Mobility: Supine to Sit, Sit to Supine     Supine to sit: Mod assist Sit to supine: Min assist   General bed mobility comments: mod A to upright and into sitting and scooting out to bedside, min A to lift BLE Back into bed and reposition to comfort    Transfers Overall transfer level: Needs assistance Equipment used: Rolling walker (2 wheels) Transfers: Sit to/from Stand Sit to Stand: Min assist, From elevated surface           General transfer comment: min A to power up and steady with rising from elevated gurney    Ambulation/Gait Ambulation/Gait assistance: Min assist Gait Distance (Feet): 12 Feet Assistive device: Rolling walker (2 wheels) Gait Pattern/deviations: Step-through pattern, Decreased stride length, Decreased step length - left, Decreased stance time - right, Trunk flexed Gait velocity: decreased     General Gait Details: slow, short steps with forward flexed trunk, minimal bil foot clearance near shuffling gait pattern, no knee buckling noted, increased BUE weightbearing on RW and fatigues easily with distance  Stairs            Wheelchair Mobility     Tilt Bed    Modified Rankin (Stroke Patients Only)       Balance Overall balance assessment: Needs assistance Sitting-balance support: Feet unsupported, Bilateral upper extremity supported Sitting balance-Leahy Scale: Poor Sitting balance - Comments: propped on  BUE, feet not touching floor due to elevated gurney   Standing balance support: Reliant on assistive device for balance, During functional activity, Bilateral upper extremity supported Standing balance-Leahy Scale: Poor                                Pertinent Vitals/Pain Pain Assessment Pain Assessment: No/denies pain    Home Living Family/patient expects to be discharged to:: Skilled nursing facility                   Additional Comments: Daughter reports pt ended up in hospital10/21 and d/c to [facility] on 10/25 where pt has been working with therapy; prior to 10/21 pt was progressing well with OPPT for post-op TKA    Prior Function Prior Level of Function : Needs assist             Mobility Comments: daughter reports pt using RW with therapy for short distances, using w/c with nursing in facility ADLs Comments: daughter reports facility assisting     Extremity/Trunk Assessment   Upper Extremity Assessment Upper Extremity Assessment: Defer to OT evaluation    Lower Extremity Assessment Lower Extremity Assessment: Generalized weakness (AROM WFL, strength grossly 3+/5, denies numbnes/tingling throughout)    Cervical / Trunk Assessment Cervical / Trunk Assessment: Kyphotic  Communication   Communication Communication: Impaired Factors Affecting Communication: Hearing impaired    Cognition Arousal: Alert Behavior During Therapy: WFL for tasks assessed/performed   PT - Cognitive impairments: History of cognitive impairments                       PT - Cognition Comments: daughter present reports pt not at baseline, referencing previous employeers and co-workers; pt pleasant, able to follow commands Following commands: Intact       Cueing Cueing Techniques: Verbal cues, Tactile cues     General Comments General comments (skin integrity, edema, etc.): BP 99/43 in supine, 112/53 in standing, denies dizziness, lightheadedness or swimmy feeling throughout    Exercises     Assessment/Plan    PT Assessment Patient needs continued PT services  PT Problem List Decreased strength;Decreased activity tolerance;Decreased balance;Decreased mobility;Decreased cognition;Decreased knowledge of  use of DME;Decreased safety awareness       PT Treatment Interventions DME instruction;Gait training;Functional mobility training;Therapeutic activities;Therapeutic exercise;Balance training;Neuromuscular re-education;Patient/family education    PT Goals (Current goals can be found in the Care Plan section)  Acute Rehab PT Goals Patient Stated Goal: SNF then home PT Goal Formulation: With patient/family Time For Goal Achievement: 02/29/24 Potential to Achieve Goals: Good    Frequency Min 2X/week     Co-evaluation               AM-PAC PT 6 Clicks Mobility  Outcome Measure Help needed turning from your back to your side while in a flat bed without using bedrails?: A Little Help needed moving from lying on your back to sitting on the side of a flat bed without using bedrails?: A Little Help needed moving to and from a bed to a chair (including a wheelchair)?: A Little Help needed standing up from a chair using your arms (e.g., wheelchair or bedside chair)?: A Little Help needed to walk in hospital room?: A Lot Help needed climbing 3-5 steps with a railing? : Total 6 Click Score: 15    End of Session Equipment Utilized During Treatment: Gait belt Activity Tolerance: Patient tolerated treatment well  Patient left: in bed;with call bell/phone within reach;with family/visitor present Nurse Communication: Mobility status PT Visit Diagnosis: Unsteadiness on feet (R26.81);Other abnormalities of gait and mobility (R26.89);Muscle weakness (generalized) (M62.81)    Time: 8689-8651 PT Time Calculation (min) (ACUTE ONLY): 38 min   Charges:   PT Evaluation $PT Eval Moderate Complexity: 1 Mod PT Treatments $Therapeutic Activity: 8-22 mins PT General Charges $$ ACUTE PT VISIT: 1 Visit         Tori Stefan Markarian PT, DPT 02/15/24, 2:34 PM

## 2024-02-15 NOTE — Assessment & Plan Note (Addendum)
 02/15/24 present on admission. Fulfills criteria with lactic acid of 2.0, WBC 15.8, HR 109. No evidence of infection. Sepsis ruled out. Not on any abx.

## 2024-02-15 NOTE — Assessment & Plan Note (Addendum)
 02/15/24 unclear the cause. Not from sepsis. Mostly likely from hypotension, dehydration. Started On IVF. Sent from SNF with altered metal status. Reportedly at baseline she is AxOx4, but only oriented x 2 yesterday in ER.  Mentation is back to baseline now.  Stop IV fluids.  Stable for discharge back.  I think her leukocytosis is due to stress from her hypotension and not from infection.

## 2024-02-15 NOTE — Subjective & Objective (Addendum)
 Pt seen and examined. Chart reviewed. Sent to ER from SNF for AMS and hypotension. Recent admission and discharge from [facility] (Oct 21 - Oct 25) for UTI, sepsis, PSVT.  Spoke at length with pt's dtr Gina [name].  Patient is now alert and oriented x 4 at baseline according to the daughter.  Patient had been placed on Lopressor for PSVT while she was at [facility].  Patient is also been refusing her losartan while she has been at the nursing home.  Daughter states the patient does not eat and drink well.  She was hypotensive on arrival and with EMS.  She has been given 2 L of fluid and her mentation is now back at baseline.  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  Daughter wants to tell her herself.  She wants [facility] referral to [facility] for outpatient follow-up.  Patient herself wants to be discharged to back to the facility.  Daughter is in agreement.

## 2024-02-18 LAB — T.PALLIDUM AB, TOTAL: T Pallidum Abs: NONREACTIVE

## 2024-02-19 LAB — CULTURE, BLOOD (ROUTINE X 2)
Culture: NO GROWTH
Special Requests: ADEQUATE

## 2024-02-22 ENCOUNTER — Other Ambulatory Visit: Payer: Self-pay

## 2024-02-22 ENCOUNTER — Observation Stay (HOSPITAL_COMMUNITY): Admission: EM | Admit: 2024-02-22 | Discharge: 2024-02-29 | Disposition: A | Discharge status: Home / Self Care

## 2024-02-22 ENCOUNTER — Emergency Department (HOSPITAL_COMMUNITY)

## 2024-02-22 ENCOUNTER — Encounter (HOSPITAL_COMMUNITY): Payer: Self-pay | Admitting: Emergency Medicine

## 2024-02-22 DIAGNOSIS — M25562 Pain in left knee: Secondary | ICD-10-CM | POA: Diagnosis not present

## 2024-02-22 DIAGNOSIS — I129 Hypertensive chronic kidney disease with stage 1 through stage 4 chronic kidney disease, or unspecified chronic kidney disease: Secondary | ICD-10-CM | POA: Insufficient documentation

## 2024-02-22 DIAGNOSIS — R410 Disorientation, unspecified: Secondary | ICD-10-CM | POA: Diagnosis present

## 2024-02-22 DIAGNOSIS — E876 Hypokalemia: Secondary | ICD-10-CM | POA: Diagnosis present

## 2024-02-22 DIAGNOSIS — K219 Gastro-esophageal reflux disease without esophagitis: Secondary | ICD-10-CM

## 2024-02-22 DIAGNOSIS — W19XXXA Unspecified fall, initial encounter: Principal | ICD-10-CM

## 2024-02-22 DIAGNOSIS — N1831 Chronic kidney disease, stage 3a: Secondary | ICD-10-CM | POA: Diagnosis not present

## 2024-02-22 DIAGNOSIS — G9341 Metabolic encephalopathy: Secondary | ICD-10-CM | POA: Diagnosis not present

## 2024-02-22 DIAGNOSIS — N2889 Other specified disorders of kidney and ureter: Secondary | ICD-10-CM | POA: Insufficient documentation

## 2024-02-22 DIAGNOSIS — F39 Unspecified mood [affective] disorder: Secondary | ICD-10-CM | POA: Diagnosis not present

## 2024-02-22 DIAGNOSIS — N390 Urinary tract infection, site not specified: Secondary | ICD-10-CM | POA: Diagnosis not present

## 2024-02-22 DIAGNOSIS — I10 Essential (primary) hypertension: Secondary | ICD-10-CM

## 2024-02-22 LAB — BASIC METABOLIC PANEL WITH GFR
Anion gap: 12 (ref 5–15)
BUN: 19 mg/dL (ref 8–23)
CO2: 24 mmol/L (ref 22–32)
Calcium: 9.9 mg/dL (ref 8.9–10.3)
Chloride: 105 mmol/L (ref 98–111)
Creatinine, Ser: 0.88 mg/dL (ref 0.44–1.00)
GFR, Estimated: >60 mL/min (ref >60)
Glucose, Bld: 102 mg/dL — ABNORMAL HIGH (ref 70–99)
Potassium: 3.3 mmol/L — ABNORMAL LOW (ref 3.5–5.1)
Sodium: 141 mmol/L (ref 135–145)

## 2024-02-22 LAB — CBC WITH DIFFERENTIAL/PLATELET
Abs Immature Granulocytes: 0.05 K/uL (ref 0.00–0.07)
Basophils Absolute: 0 K/uL (ref 0.0–0.1)
Basophils Relative: 0 %
Eosinophils Absolute: 0.2 K/uL (ref 0.0–0.5)
Eosinophils Relative: 2 %
HCT: 38.5 % (ref 36.0–46.0)
Hemoglobin: 13.1 g/dL (ref 12.0–15.0)
Immature Granulocytes: 1 %
Lymphocytes Relative: 9 %
Lymphs Abs: 0.8 K/uL (ref 0.7–4.0)
MCH: 33.7 pg (ref 26.0–34.0)
MCHC: 34 g/dL (ref 30.0–36.0)
MCV: 99 fL (ref 80.0–100.0)
Monocytes Absolute: 0.9 K/uL (ref 0.1–1.0)
Monocytes Relative: 10 %
Neutro Abs: 7.3 K/uL (ref 1.7–7.7)
Neutrophils Relative %: 78 %
Platelets: 260 K/uL (ref 150–400)
RBC: 3.89 MIL/uL (ref 3.87–5.11)
RDW: 13.2 % (ref 11.5–15.5)
WBC: 9.3 K/uL (ref 4.0–10.5)
nRBC: 0 % (ref 0.0–0.2)

## 2024-02-22 MED ORDER — TRAMADOL HCL 50 MG PO TABS
50.0000 mg | ORAL_TABLET | Freq: Three times a day (TID) | ORAL | Status: DC | PRN
  Administered 2024-02-22: 50 mg via ORAL
  Filled 2024-02-22: qty 1

## 2024-02-22 NOTE — ED Notes (Addendum)
 Tech attempted to ambulate pt both with and without front-wheeled walker and gait belt; pt was unable to tolerate weight-bearing. Unable to ambulate. Provider notified.

## 2024-02-22 NOTE — ED Provider Notes (Incomplete Revision)
 [place] EMERGENCY DEPARTMENT AT [facility] Provider Note   CSN: [id number] Arrival date & time: 02/22/24  1621     Patient presents with: [place]   Gina [name] is a 89 y.o. female presents today after a witnessed/assisted fall during a transfer from a chair to another chair.  Patient was assisted to the ground.  Patient reports left knee pain with a history of knee replacement on the same knee.  Patient denies head injury, nausea, vomiting, numbness, weakness, any other complaints at this time.  Patient denies blood thinner use.    Fall       Prior to Admission medications   Medication Sig Start Date End Date Taking? Authorizing Provider  acetaminophen  (TYLENOL ) 500 MG tablet Take 1,000 mg by mouth every 8 (eight) hours as needed for moderate pain (pain score 4-6).    [provider]  carboxymethylcellulose (REFRESH PLUS) 0.5 % SOLN Place 1 drop into both eyes daily.    [provider]  divalproex (DEPAKOTE) 125 MG DR tablet Take 125 mg by mouth every evening.    [provider]  hydroxypropyl methylcellulose / hypromellose (ISOPTO TEARS / GONIOVISC) 2.5 % ophthalmic solution Place 2 drops into the right eye 3 (three) times daily.    [provider]  omeprazole (PRILOSEC) 20 MG capsule Take 20 mg by mouth daily. 04/05/20   [provider]  traMADol  (ULTRAM ) 50 MG tablet Take 1-2 tablets (50-100 mg total) by mouth every 8 (eight) hours as needed for moderate pain (pain score 4-6). 02/15/24 03/16/24  Laurence Locus, DO    Allergies: Codeine and Strawberry extract    Review of Systems  Musculoskeletal:  Positive for arthralgias.    Updated Vital Signs BP (!) 113/55 (BP Location: Right Arm)   Pulse 74   Temp 97.6 F (36.4 C) (Oral)   Resp 16   Ht 5' 5 (1.651 m)   Wt 68 kg   SpO2 96%   BMI 24.96 kg/m   Physical Exam Vitals and nursing note reviewed.  Constitutional:      General: She is not in acute distress.     Appearance: She is well-developed. She is not toxic-appearing.  HENT:     Head: Normocephalic and atraumatic.     Right Ear: External ear normal.     Left Ear: External ear normal.     Nose: Nose normal.  Eyes:     Conjunctiva/sclera: Conjunctivae normal.  Cardiovascular:     Rate and Rhythm: Normal rate and regular rhythm.     Heart sounds: No murmur heard. Pulmonary:     Effort: Pulmonary effort is normal. No respiratory distress.     Breath sounds: Normal breath sounds.  Abdominal:     Palpations: Abdomen is soft.     Tenderness: There is no abdominal tenderness.  Musculoskeletal:        General: Tenderness present. No swelling.     Cervical back: Neck supple.     Comments: Mild tenderness to palpation of the left anterior tibial plateau without ecchymosis or deformity noted on exam.  Patient is neurovascularly intact with +2 dorsalis pedis pulses.  Skin:    General: Skin is warm and dry.     Capillary Refill: Capillary refill takes less than 2 seconds.  Neurological:     Mental Status: She is alert.  Psychiatric:        Mood and Affect: Mood normal.     (all labs ordered are listed, but only  abnormal results are displayed) Labs Reviewed - No data to display  EKG: None  Radiology: DG Knee Complete 4 Views Left Result Date: 02/22/2024 EXAM: 4 VIEW(S) XRAY OF THE LEFT KNEE 02/22/2024 04:51:00 PM COMPARISON: 10/15/2023 CLINICAL HISTORY: fall FINDINGS: BONES AND JOINTS: Interval left total knee arthroplasty is in place. Small effusion in the suprapatellar bursa. No acute fracture. No focal osseous lesion. No joint dislocation. SOFT TISSUES: Femoropopliteal arterial calcifications. The soft tissues are otherwise unremarkable. IMPRESSION: 1. Interval left total knee arthroplasty in expected position. Electronically signed by: Morgane Naveau MD 02/22/2024 05:02 PM EST RP Workstation: [id number]     Procedures   Medications Ordered in the ED - No data to display                                   Medical Decision Making Amount and/or Complexity of Data Reviewed Radiology: ordered.   This patient presents to the ED for concern of knee pain differential diagnosis includes fracture, dislocation, musculoskeletal pain  Labs: CBC unremarkable, mild hypokalemia 3.3   Imaging Studies ordered:  I ordered imaging studies including left knee x-ray I independently visualized and interpreted imaging which showed interval left total knee arthroplasty in expected position. I agree with the radiologist interpretation  Problem List / ED Course:  Patient placed in knee sleeve for support. Considered for admission or further workup however patient's vital signs, physical exam, and imaging are reassuring.  Patient's symptoms likely due to musculoskeletal pain.  Patient given fevers for support prior to discharge.  Patient advised to ice and elevate affected limb.  I feel patient safe for discharge at this time.      Final diagnoses:  Fall, initial encounter  Acute pain of left knee    ED Discharge Orders     None          Francis Ileana SAILOR, PA-C 02/22/24 1733

## 2024-02-22 NOTE — ED Provider Notes (Cosign Needed Addendum)
 [place] EMERGENCY DEPARTMENT AT [facility] Provider Note   CSN: [id number] Arrival date & time: 02/22/24  1621     Patient presents with: [place]   Gina [name] is a 89 y.o. female presents today after a witnessed/assisted fall during a transfer from a chair to another chair.  Patient was assisted to the ground.  Patient reports left knee pain with a history of knee replacement on the same knee.  Patient denies head injury, nausea, vomiting, numbness, weakness, any other complaints at this time.  Patient denies blood thinner use.  Per patient's family patient has been more confused and weak recently. They are concerned for her safety at home.     Fall       Prior to Admission medications   Medication Sig Start Date End Date Taking? Authorizing Provider  acetaminophen  (TYLENOL ) 500 MG tablet Take 1,000 mg by mouth every 8 (eight) hours as needed for moderate pain (pain score 4-6).    [provider]  carboxymethylcellulose (REFRESH PLUS) 0.5 % SOLN Place 1 drop into both eyes daily.    [provider]  divalproex (DEPAKOTE) 125 MG DR tablet Take 125 mg by mouth every evening.    [provider]  hydroxypropyl methylcellulose / hypromellose (ISOPTO TEARS / GONIOVISC) 2.5 % ophthalmic solution Place 2 drops into the right eye 3 (three) times daily.    [provider]  omeprazole (PRILOSEC) 20 MG capsule Take 20 mg by mouth daily. 04/05/20   [provider]  traMADol  (ULTRAM ) 50 MG tablet Take 1-2 tablets (50-100 mg total) by mouth every 8 (eight) hours as needed for moderate pain (pain score 4-6). 02/15/24 03/16/24  Laurence Locus, DO    Allergies: Codeine and Strawberry extract    Review of Systems  Musculoskeletal:  Positive for arthralgias.    Updated Vital Signs BP (!) 122/49   Pulse 71   Temp (!) 97.5 F (36.4 C) (Oral)   Resp 16   Ht 5' 5 (1.651 m)   Wt 68 kg   SpO2 100%   BMI 24.96 kg/m   Physical  Exam Vitals and nursing note reviewed.  Constitutional:      General: She is not in acute distress.    Appearance: She is well-developed. She is not toxic-appearing.  HENT:     Head: Normocephalic and atraumatic.     Right Ear: External ear normal.     Left Ear: External ear normal.     Nose: Nose normal.  Eyes:     Conjunctiva/sclera: Conjunctivae normal.  Cardiovascular:     Rate and Rhythm: Normal rate and regular rhythm.     Heart sounds: No murmur heard. Pulmonary:     Effort: Pulmonary effort is normal. No respiratory distress.     Breath sounds: Normal breath sounds.  Abdominal:     Palpations: Abdomen is soft.     Tenderness: There is no abdominal tenderness.  Musculoskeletal:        General: Tenderness present. No swelling.     Cervical back: Neck supple.     Comments: Mild tenderness to palpation of the left anterior tibial plateau without ecchymosis or deformity noted on exam.  Patient is neurovascularly intact with +2 dorsalis pedis pulses.  Skin:    General: Skin is warm and dry.     Capillary Refill: Capillary refill takes less than 2 seconds.  Neurological:     Mental Status: She is alert.  Psychiatric:  Mood and Affect: Mood normal.     (all labs ordered are listed, but only abnormal results are displayed) Labs Reviewed  BASIC METABOLIC PANEL WITH GFR - Abnormal; Notable for the following components:      Result Value   Potassium 3.3 (*)    Glucose, Bld 102 (*)    All other components within normal limits  CBC WITH DIFFERENTIAL/PLATELET  URINALYSIS, W/ REFLEX TO CULTURE (INFECTION SUSPECTED)    EKG: None  Radiology: DG Knee Complete 4 Views Left Result Date: 02/22/2024 EXAM: 4 VIEW(S) XRAY OF THE LEFT KNEE 02/22/2024 04:51:00 PM COMPARISON: 10/15/2023 CLINICAL HISTORY: fall FINDINGS: BONES AND JOINTS: Interval left total knee arthroplasty is in place. Small effusion in the suprapatellar bursa. No acute fracture. No focal osseous lesion. No  joint dislocation. SOFT TISSUES: Femoropopliteal arterial calcifications. The soft tissues are otherwise unremarkable. IMPRESSION: 1. Interval left total knee arthroplasty in expected position. Electronically signed by: Morgane Naveau MD 02/22/2024 05:02 PM EST RP Workstation: [id number]     Procedures   Medications Ordered in the ED  traMADol  (ULTRAM ) tablet 50-100 mg (50 mg Oral Given 02/22/24 2241)                                    Medical Decision Making Amount and/or Complexity of Data Reviewed Labs: ordered. Radiology: ordered.  Risk Prescription drug management.   This patient presents to the ED for concern of knee pain differential diagnosis includes fracture, dislocation, musculoskeletal pain, UTI, dehydration  Labs: CBC unremarkable, mild hypokalemia 3.3,    Imaging Studies ordered:  I ordered imaging studies including left knee x-ray I independently visualized and interpreted imaging which showed interval left total knee arthroplasty in expected position. I agree with the radiologist interpretation  Problem List / ED Course:  Patient placed in knee sleeve for support. Patient unable to ambulate with walker as she normally does at home.  Patient signed out to Olam Slocumb, PA-C pending UA and likely admission.  Please refer to their note for full ED course.    Final diagnoses:  Fall, initial encounter  Acute pain of left knee    ED Discharge Orders     None          Francis Ileana SAILOR, PA-C 02/22/24 1733    Francis Ileana SAILOR, PA-C 02/23/24 0012    Neysa Caron PARAS, DO 02/25/24 1609

## 2024-02-22 NOTE — ED Triage Notes (Signed)
 Pt via [facility] after witnessed/assisted fall during transfer from chair to chair. Pt was assisted to the ground. Reports left knee pain, hx of surgery to same. A/O x 4. No visible deformity.   BP 110/64 HR 84 O2 98% RA CBG 130

## 2024-02-22 NOTE — Progress Notes (Signed)
 Orthopedic Tech Progress Note Patient Details:  Gina [name] 1932-09-13 [id number]  Ortho Devices Type of Ortho Device: Knee Sleeve Ortho Device/Splint Location: LLE Ortho Device/Splint Interventions: Application   Post Interventions Patient Tolerated: Well  Massie BRAVO Jahleel Stroschein 02/22/2024, 6:04 PM

## 2024-02-22 NOTE — ED Provider Notes (Incomplete)
 Gina [name] AT [facility] Provider Note   CSN: [id number] Arrival date & time: 02/22/24  1621     Patient presents with: Gina   [name] is a 89 y.o. female presents today after a witnessed/assisted fall during a transfer from a chair to another chair.  Patient was assisted to the ground.  Patient reports left knee pain with a history of knee replacement on the same knee.  Patient denies head injury, nausea, vomiting, numbness, weakness, any other complaints at this time.  Patient denies blood thinner use.  Per patient's family patient has been more confused and weak recently.    Fall       Prior to Admission medications   Medication Sig Start Date End Date Taking? Authorizing Provider  acetaminophen  (TYLENOL ) 500 MG tablet Take 1,000 mg by mouth every 8 (eight) hours as needed for moderate pain (pain score 4-6).    Provider, Historical, [name]  carboxymethylcellulose (REFRESH PLUS) 0.5 % SOLN Place 1 drop into both eyes daily.    Provider, Historical, [name]  divalproex (DEPAKOTE) 125 MG DR tablet Take 125 mg by mouth every evening.    Provider, Historical, [name]  hydroxypropyl methylcellulose / hypromellose (ISOPTO TEARS / GONIOVISC) 2.5 % ophthalmic solution Place 2 drops into the right eye 3 (three) times daily.    Provider, Historical, [name]  omeprazole (PRILOSEC) 20 MG capsule Take 20 mg by mouth daily. 04/05/20   Provider, Historical, [name]  traMADol  (ULTRAM ) 50 MG tablet Take 1-2 tablets (50-100 mg total) by mouth every 8 (eight) hours as needed for moderate pain (pain score 4-6). 02/15/24 03/16/24  Gina Locus, DO    Allergies: Codeine and Strawberry extract    Review of Systems  Musculoskeletal:  Positive for arthralgias.    Updated Vital Signs BP (!) 113/55 (BP Location: Right Arm)   Pulse 74   Temp 97.6 F (36.4 C) (Oral)   Resp 16   Ht 5' 5 (1.651 m)   Wt 68 kg   SpO2 96%   BMI 24.96 kg/m   Physical Exam Vitals and nursing note  reviewed.  Constitutional:      General: She is not in acute distress.    Appearance: She is well-developed. She is not toxic-appearing.  HENT:     Head: Normocephalic and atraumatic.     Right Ear: External ear normal.     Left Ear: External ear normal.     Nose: Nose normal.  Eyes:     Conjunctiva/sclera: Conjunctivae normal.  Cardiovascular:     Rate and Rhythm: Normal rate and regular rhythm.     Heart sounds: No murmur heard. Pulmonary:     Effort: Pulmonary effort is normal. No respiratory distress.     Breath sounds: Normal breath sounds.  Abdominal:     Palpations: Abdomen is soft.     Tenderness: There is no abdominal tenderness.  Musculoskeletal:        General: Tenderness present. No swelling.     Cervical back: Neck supple.     Comments: Mild tenderness to palpation of the left anterior tibial plateau without ecchymosis or deformity noted on exam.  Patient is neurovascularly intact with +2 dorsalis pedis pulses.  Skin:    General: Skin is warm and dry.     Capillary Refill: Capillary refill takes less than 2 seconds.  Neurological:     Mental Status: She is alert.  Psychiatric:        Mood and Affect: Mood  normal.     (all labs ordered are listed, but only abnormal results are displayed) Labs Reviewed - No data to display  EKG: None  Radiology: DG Knee Complete 4 Views Left Result Date: 02/22/2024 EXAM: 4 VIEW(S) XRAY OF THE LEFT KNEE 02/22/2024 04:51:00 PM COMPARISON: 10/15/2023 CLINICAL HISTORY: fall FINDINGS: BONES AND JOINTS: Interval left total knee arthroplasty is in place. Small effusion in the suprapatellar bursa. No acute fracture. No focal osseous lesion. No joint dislocation. SOFT TISSUES: Femoropopliteal arterial calcifications. The soft tissues are otherwise unremarkable. IMPRESSION: 1. Interval left total knee arthroplasty in expected position. Electronically signed by: Gina [name] 02/22/2024 05:02 PM EST RP Workstation: [id number]      Procedures   Medications Ordered in the ED - No data to display                                  Medical Decision Making Amount and/or Complexity of Data Reviewed Labs: ordered. Radiology: ordered.  Risk Prescription drug management.   This patient presents to the ED for concern of knee pain differential diagnosis includes fracture, dislocation, musculoskeletal pain  Labs: CBC unremarkable, mild hypokalemia 3.3,    Imaging Studies ordered:  I ordered imaging studies including left knee x-ray I independently visualized and interpreted imaging which showed interval left total knee arthroplasty in expected position. I agree with the radiologist interpretation  Problem List / ED Course:  Patient placed in knee sleeve for support.       Final diagnoses:  Fall, initial encounter  Acute pain of left knee    ED Discharge Orders     None          Gina Ileana SAILOR, PA-C 02/22/24 1733

## 2024-02-23 DIAGNOSIS — M25562 Pain in left knee: Secondary | ICD-10-CM | POA: Diagnosis not present

## 2024-02-23 DIAGNOSIS — N390 Urinary tract infection, site not specified: Secondary | ICD-10-CM

## 2024-02-23 LAB — URINALYSIS, W/ REFLEX TO CULTURE (INFECTION SUSPECTED)
Bilirubin Urine: NEGATIVE
Glucose, UA: NEGATIVE mg/dL
Hgb urine dipstick: NEGATIVE
Ketones, ur: NEGATIVE mg/dL
Nitrite: POSITIVE — AB
Protein, ur: NEGATIVE mg/dL
Specific Gravity, Urine: 1.017 (ref 1.005–1.030)
WBC, UA: >50 WBC/hpf (ref 0–5)
pH: 5 (ref 5.0–8.0)

## 2024-02-23 LAB — COMPREHENSIVE METABOLIC PANEL WITH GFR
ALT: 5 U/L (ref 0–44)
AST: 17 U/L (ref 15–41)
Albumin: 3.1 g/dL — ABNORMAL LOW (ref 3.5–5.0)
Alkaline Phosphatase: 86 U/L (ref 38–126)
Anion gap: 9 (ref 5–15)
BUN: 18 mg/dL (ref 8–23)
CO2: 25 mmol/L (ref 22–32)
Calcium: 9.3 mg/dL (ref 8.9–10.3)
Chloride: 106 mmol/L (ref 98–111)
Creatinine, Ser: 0.79 mg/dL (ref 0.44–1.00)
GFR, Estimated: >60 mL/min (ref >60)
Glucose, Bld: 79 mg/dL (ref 70–99)
Potassium: 3.1 mmol/L — ABNORMAL LOW (ref 3.5–5.1)
Sodium: 139 mmol/L (ref 135–145)
Total Bilirubin: 0.7 mg/dL (ref 0.0–1.2)
Total Protein: 5.5 g/dL — ABNORMAL LOW (ref 6.5–8.1)

## 2024-02-23 LAB — MAGNESIUM: Magnesium: 1.7 mg/dL (ref 1.7–2.4)

## 2024-02-23 LAB — MRSA NEXT GEN BY PCR, NASAL: MRSA by PCR Next Gen: NOT DETECTED

## 2024-02-23 MED ORDER — DIVALPROEX SODIUM 125 MG PO DR TAB
125.0000 mg | DELAYED_RELEASE_TABLET | Freq: Every evening | ORAL | Status: DC
  Administered 2024-02-23 – 2024-02-28 (×6): 125 mg via ORAL
  Filled 2024-02-23 (×7): qty 1

## 2024-02-23 MED ORDER — ORAL CARE MOUTH RINSE: 15.0000 mL | OROMUCOSAL | Status: DC | PRN

## 2024-02-23 MED ORDER — SODIUM CHLORIDE 0.9 % IV SOLN
1.0000 g | INTRAVENOUS | Status: DC
  Administered 2024-02-24 – 2024-02-25 (×3): 1 g via INTRAVENOUS
  Filled 2024-02-23 (×3): qty 10

## 2024-02-23 MED ORDER — POTASSIUM CHLORIDE CRYS ER 20 MEQ PO TBCR: 40.0000 meq | EXTENDED_RELEASE_TABLET | Freq: Once | ORAL | Status: DC

## 2024-02-23 MED ORDER — NAPHAZOLINE-PHENIRAMINE 0.025-0.3 % OP SOLN
1.0000 [drp] | Freq: Every day | OPHTHALMIC | Status: DC
  Administered 2024-02-23 – 2024-02-29 (×7): 1 [drp] via OPHTHALMIC
  Filled 2024-02-23: qty 15

## 2024-02-23 MED ORDER — ENOXAPARIN SODIUM 40 MG/0.4ML IJ SOSY
40.0000 mg | PREFILLED_SYRINGE | INTRAMUSCULAR | Status: DC
  Administered 2024-02-23 – 2024-02-29 (×7): 40 mg via SUBCUTANEOUS
  Filled 2024-02-23 (×7): qty 0.4

## 2024-02-23 MED ORDER — PANTOPRAZOLE SODIUM 40 MG PO TBEC
40.0000 mg | DELAYED_RELEASE_TABLET | Freq: Every day | ORAL | Status: DC
  Administered 2024-02-23 – 2024-02-29 (×7): 40 mg via ORAL
  Filled 2024-02-23 (×7): qty 1

## 2024-02-23 MED ORDER — POTASSIUM CHLORIDE CRYS ER 20 MEQ PO TBCR
40.0000 meq | EXTENDED_RELEASE_TABLET | Freq: Once | ORAL | Status: AC
  Administered 2024-02-23: 40 meq via ORAL
  Filled 2024-02-23: qty 2

## 2024-02-23 MED ORDER — TRAMADOL HCL 50 MG PO TABS
50.0000 mg | ORAL_TABLET | Freq: Three times a day (TID) | ORAL | Status: DC | PRN
  Administered 2024-02-23: 100 mg via ORAL
  Administered 2024-02-23: 50 mg via ORAL
  Administered 2024-02-24: 100 mg via ORAL
  Administered 2024-02-24: 50 mg via ORAL
  Administered 2024-02-25 – 2024-02-27 (×3): 100 mg via ORAL
  Administered 2024-02-28 – 2024-02-29 (×2): 50 mg via ORAL
  Filled 2024-02-23: qty 2
  Filled 2024-02-23: qty 1
  Filled 2024-02-23: qty 2
  Filled 2024-02-23: qty 1
  Filled 2024-02-23 (×3): qty 2
  Filled 2024-02-23: qty 1
  Filled 2024-02-23: qty 2

## 2024-02-23 MED ORDER — ACETAMINOPHEN 500 MG PO TABS
1000.0000 mg | ORAL_TABLET | Freq: Three times a day (TID) | ORAL | Status: DC | PRN
  Administered 2024-02-24 (×2): 1000 mg via ORAL
  Filled 2024-02-23 (×2): qty 2

## 2024-02-23 MED ORDER — SODIUM CHLORIDE 0.9 % IV SOLN
1.0000 g | Freq: Once | INTRAVENOUS | Status: AC
  Administered 2024-02-23: 1 g via INTRAVENOUS
  Filled 2024-02-23: qty 10

## 2024-02-23 NOTE — Progress Notes (Signed)
 PROGRESS NOTE    Gina [name]  FMW:[id number] DOB: Aug 20, 1932 DOA: 02/22/2024 PCP: Teresa Thersia Jansky, MD   Brief Narrative:  DAY ZERO NOTE -please see H&P by Dr. Respiratory early this morning for complete admission documentation and history  Briefly patient is a pleasant 89 year old female with history of hypertension CKD 3A left total knee replacement recently on 9/22 previously admitted to our facility late October for sepsis UTI SVT and encephalopathy.  She was admitted again on 11/6 for recurrent episodes of encephalopathy with left renal mass concerning for renal cell carcinoma recommendation for outpatient follow-up and discharged to SNF.  Patient was discharged from SNF 24 hours prior to presenting to our facility after what appears to be a collapse and fall without loss of consciousness.  There was no mechanical fall, as patient did not trip, however her legs gave out as her family was helping her out of the car.  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 - Recent left knee arthroplasty appears stable on imaging   Presumed UTI -Due to abnormal UA and symptoms of encephalopathy  - Follow culture, continue ceftriaxone  x 3 days  Acute encephalopathy - Likely secondary to UTI as above, no syncope prior to hospitalization, patient and family deny any head strike during fall - Hold off on repeat imaging given patient's recent radiation burden and ongoing improvement with  supportive care   Mild hypokalemia -replete as appropriate, likely secondary to poor p.o. intake  Hypertension -recently discontinued for concern of orthostatics/hypotension CKD stage II-IIIa - Creatinine stable.   Recently discovered left renal mass of unspecified etiology - Recent CT at our facility shows left renal mass with potential thrombus extending into the left renal vein concerning for renal cell carcinoma -Urology recommending outpatient follow-up   Mood disorder, unspecified continue Depakote GERD Continue PPI.  DVT prophylaxis: enoxaparin  (LOVENOX ) injection 40 mg Start: 02/23/24 1000 Code Status:   Code Status: Do not attempt resuscitation (DNR) PRE-ARREST INTERVENTIONS DESIRED Family Communication: At bedside  Status is: Observation  Dispo: The patient is from: Home              Anticipated d/c is to: To be determined              Anticipated d/c date is: To be determined              Patient currently is medically stable for discharge awaiting safe disposition at SNF  Consultants:  None  Procedures:  None  Antimicrobials:  Ceftriaxone   Subjective: No acute issues or events overnight  Objective: Vitals:   02/23/24 0154 02/23/24 0602 02/23/24 0718 02/23/24 0721  BP: (!) 112/50  (!) 107/93   Pulse: 73  84   Resp: 16  17   Temp: 97.6 F (36.4 C) (!) 97.2 F (36.2 C) 98 F (36.7 C)   TempSrc: Oral Oral Oral   SpO2: 97%  97% 96%  Weight:      Height:        Intake/Output Summary (Last 24 hours)  at 02/23/2024 0819 Last data filed at 02/23/2024 0150 Gross per 24 hour  Intake 100 ml  Output --  Net 100 ml   Filed Weights   02/22/24 1634  Weight: 68 kg    Examination:  General:  Pleasantly resting in bed, No acute distress. HEENT:  Normocephalic atraumatic.  Sclerae nonicteric, noninjected.  Extraocular movements intact bilaterally. Neck:  Without mass or deformity.  Trachea is midline. Lungs:  Clear to auscultate bilaterally without rhonchi,  wheeze, or rales. Heart:  Regular rate and rhythm.  Without murmurs, rubs, or gallops. Abdomen:  Soft, nontender, nondistended.  Without guarding or rebound. Extremities: Without cyanosis, clubbing, edema, or obvious deformity. Skin:  Warm and dry, no erythema.   Data Reviewed: I have personally reviewed following labs and imaging studies  CBC: Recent Labs  Lab 02/22/24 2012  WBC 9.3  NEUTROABS 7.3  HGB 13.1  HCT 38.5  MCV 99.0  PLT 260   Basic Metabolic Panel: Recent Labs  Lab 02/22/24 2012 02/23/24 0537  NA 141 139  K 3.3* 3.1*  CL 105 106  CO2 24 25  GLUCOSE 102* 79  BUN 19 18  CREATININE 0.88 0.79  CALCIUM 9.9 9.3  MG  --  1.7   GFR: Estimated Creatinine Clearance: 42.9 mL/min (by C-G formula based on SCr of 0.79 mg/dL). Liver Function Tests: Recent Labs  Lab 02/23/24 0537  AST 17  ALT 5  ALKPHOS 86  BILITOT 0.7  PROT 5.5*  ALBUMIN 3.1*   No results for input(s): LIPASE, AMYLASE in the last 168 hours. No results for input(s): AMMONIA in the last 168 hours. Coagulation Profile: No results for input(s): INR, PROTIME in the last 168 hours. Cardiac Enzymes: No results for input(s): CKTOTAL, CKMB, CKMBINDEX, TROPONINI in the last 168 hours. BNP (last 3 results) No results for input(s): PROBNP in the last 8760 hours. HbA1C: No results for input(s): HGBA1C in the last 72 hours. CBG: No results for input(s): GLUCAP in the last 168 hours. Lipid Profile: No results for input(s): CHOL, HDL, LDLCALC, TRIG, CHOLHDL, LDLDIRECT in the last 72 hours. Thyroid Function Tests: No results for input(s): TSH, T4TOTAL, FREET4, T3FREE, THYROIDAB in the last 72 hours. Anemia Panel: No results for input(s): VITAMINB12, FOLATE, FERRITIN, TIBC, IRON, RETICCTPCT in the last 72 hours. Sepsis Labs: No results for input(s): PROCALCITON, LATICACIDVEN in the last 168 hours.  Recent Results (from the past 240 hours)   Culture, blood (Routine x 2)     Status: None   Collection Time: 02/14/24  5:49 PM   Specimen: BLOOD  Result Value Ref Range Status   Specimen Description   Final    BLOOD BLOOD RIGHT ARM Performed at [facility], [address] [place], [place] [address]    Special Requests   Final    BOTTLES DRAWN AEROBIC AND ANAEROBIC Blood Culture adequate volume Performed at [facility], [address], [place], [place] [address]    Culture   Final    NO GROWTH 5 DAYS Performed at [facility], [address] [place], [place] [address]    Report Status 02/19/2024 FINAL  Final         Radiology Studies: DG Knee Complete 4 Views Left Result Date: 02/22/2024 EXAM: 4 VIEW(S) XRAY OF THE LEFT KNEE 02/22/2024 04:51:00 PM COMPARISON: 10/15/2023 CLINICAL HISTORY: fall FINDINGS: BONES AND JOINTS: Interval left total knee arthroplasty is in place. Small effusion in the suprapatellar bursa. No acute fracture. No focal  osseous lesion. No joint dislocation. SOFT TISSUES: Femoropopliteal arterial calcifications. The soft tissues are otherwise unremarkable. IMPRESSION: 1. Interval left total knee arthroplasty in expected position. Electronically signed by: Morgane Naveau MD 02/22/2024 05:02 PM EST RP Workstation: [id number]        Scheduled Meds:  divalproex  125 mg Oral QPM   enoxaparin  (LOVENOX ) injection  40 mg Subcutaneous Q24H   pantoprazole   40 mg Oral Daily   Continuous Infusions:  [START ON 02/24/2024] cefTRIAXone  (ROCEPHIN )  IV       LOS: 0 days   Time spent:  Elsie JAYSON Mont[place], DO [facility]  If 7PM-7AM, please contact night-coverage [email or web address]  02/23/2024, 8:19 AM

## 2024-02-23 NOTE — H&P (Signed)
 History and Physical    Gina [name] FMW:[id number] DOB: 07/26/1932 DOA: 02/22/2024  PCP: Teresa Thersia Jansky, MD  Chief Complaint: Fall  HPI: Gina [name] is a 89 y.o. 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  Recently admitted to the hospital 10/21-10/25 for acute metabolic encephalopathy/delirium secondary to UTI/severe sepsis and SVT.  She was started on metoprolol.  Admitted again 11/6-11/7 for acute metabolic encephalopathy, SIRS, and left renal mass.  CT was concerning for a left renal mass with tumor thrombus extending into a branch of the left renal vein highly concerning for renal cell carcinoma.  Urology was consulted and recommended outpatient follow-up.  Metoprolol and valsartan  were discontinued due to hypotension.  She was discharged to SNF.  Patient presents to the ED for evaluation of left knee pain after a fall.  Vital signs on arrival to the ED: Temperature 97.6 F, pulse 74, respiratory rate 16, blood pressure 113/55, and SpO2 96% on room air.  Labs showing no leukocytosis or anemia, potassium 3.3, glucose 102.  UA with positive nitrite, large leukocytes, 11-20 RBCs, >50 WBCs, and many bacteria.  Urine culture in process.  X-ray of left knee showing arthroplasty in expected position and no fracture or dislocation.  Knee sleeve applied.  Patient was unable to ambulate safely.  Ceftriaxone  and tramadol  given.  [facility] called to admit.  History limited as patient is hard of hearing.  She is currently oriented to person and place, she knows the month is November but thinks the year is 56.  She is reporting pain in her left knee since after having a fall yesterday although she is not able to tell how exactly she fell.  Also reports having bladder infection and states I pee a whole lot.  Denies fevers, headaches, neck pain, cough, shortness of breath, chest pain, nausea, vomiting, abdominal pain,  diarrhea, constipation, back or flank pain.  No family at bedside at this time.  I spoke to the patient's daughter [name] over the phone who informed me that the patient was just discharged from SNF yesterday evening and as soon as they reached home, daughter was helping the patient walk back to the house from the car when she fell due to generalized weakness.  Daughter states patient's legs buckled and she fell on her knees although she does not believe that the patient hit her knees on the floor too hard.  Daughter then assisted the patient to the ground.  No head injury or loss of consciousness reported.  Daughter states patient has had some ongoing confusion since her recent hospitalization for UTI and never went back to baseline.  Review of Systems:  Review of Systems  All other systems reviewed and are negative.   Past Medical History:  Diagnosis Date   CKD (chronic kidney disease) stage 3, GFR 30-59 ml/min (HCC)    GERD (gastroesophageal reflux disease)    Hypertension    Paroxysmal SVT (supraventricular tachycardia) 02/15/2024   Pneumonia     Past Surgical History:  Procedure Laterality Date   FOOT SURGERY     Partial removal right great toe   Right knee surgery     SHOULDER SURGERY Right    TOTAL KNEE ARTHROPLASTY Left 12/31/2023   Procedure: ARTHROPLASTY, KNEE, TOTAL;  Surgeon: Melodi Lerner, MD;  Location: [facility];  Service: Orthopedics;  Laterality: Left;     reports that she has never smoked. She has never used smokeless tobacco.  She reports that she does not drink alcohol  and does not use drugs.  Allergies  Allergen Reactions   Codeine Other (See Comments)    Insomnia    Strawberry Extract Hives    Family History  Problem Relation Age of Onset   Heart disease Mother    Cataracts Mother    Lung cancer Father     Prior to Admission medications   Medication Sig Start Date End Date Taking? Authorizing Provider  acetaminophen  (TYLENOL ) 500 MG tablet Take 1,000 mg  by mouth every 8 (eight) hours as needed for moderate pain (pain score 4-6).   Yes [provider]  Amino Acids-Protein Hydrolys (PRO-STAT PO) Take 30 mLs by mouth daily.   Yes [provider]  aspirin  EC 81 MG tablet Take 81 mg by mouth daily.   Yes [provider]  carboxymethylcellulose (REFRESH PLUS) 0.5 % SOLN Place 1 drop into both eyes daily.   Yes [provider]  divalproex (DEPAKOTE) 125 MG DR tablet Take 125 mg by mouth every evening.   Yes [provider]  omeprazole (PRILOSEC) 20 MG capsule Take 20 mg by mouth daily. 04/05/20  Yes [provider]  traMADol  (ULTRAM ) 50 MG tablet Take 1-2 tablets (50-100 mg total) by mouth every 8 (eight) hours as needed for moderate pain (pain score 4-6). 02/15/24 03/16/24 Yes Laurence Locus, DO  hydroxypropyl methylcellulose / hypromellose (ISOPTO TEARS / GONIOVISC) 2.5 % ophthalmic solution Place 2 drops into the right eye 3 (three) times daily. Patient not taking: Reported on 02/23/2024    [provider]  metoprolol succinate (TOPROL-XL) 25 MG 24 hr tablet Take 25 mg by mouth daily. Patient not taking: Reported on 02/23/2024 02/02/24 05/02/24  [provider]    Physical Exam: Vitals:   02/22/24 2110 02/22/24 2243 02/22/24 2335 02/23/24 0154  BP: 97/67 (!) 113/56 (!) 122/49 (!) 112/50  Pulse: 87 80 71 73  Resp: 16 16  16   Temp: 98.4 F (36.9 C) (!) 97.5 F (36.4 C)  97.6 F (36.4 C)  TempSrc: Oral Oral  Oral  SpO2: 96% 98% 100% 97%  Weight:      Height:        Physical Exam Vitals reviewed.  Constitutional:      General: She is not in acute distress. HENT:     Head: Normocephalic and atraumatic.  Eyes:     Extraocular Movements: Extraocular movements intact.  Cardiovascular:     Rate and Rhythm: Normal rate and regular rhythm.     Heart sounds: Normal heart sounds.  Pulmonary:     Effort: Pulmonary effort is normal. No respiratory distress.     Breath sounds:  Normal breath sounds.  Abdominal:     General: Bowel sounds are normal. There is no distension.     Palpations: Abdomen is soft.     Tenderness: There is no abdominal tenderness. There is no guarding.  Musculoskeletal:     Cervical back: Normal range of motion.     Comments: Left lower extremity: Knee sleeve in place  Skin:    General: Skin is warm and dry.  Neurological:     General: No focal deficit present.     Mental Status: She is alert.     Cranial Nerves: No cranial nerve deficit.     Sensory: No sensory deficit.     Motor: No weakness.     Labs on Admission: I have personally reviewed following labs and imaging studies  CBC: Recent  Labs  Lab 02/22/24 2012  WBC 9.3  NEUTROABS 7.3  HGB 13.1  HCT 38.5  MCV 99.0  PLT 260   Basic Metabolic Panel: Recent Labs  Lab 02/22/24 2012  NA 141  K 3.3*  CL 105  CO2 24  GLUCOSE 102*  BUN 19  CREATININE 0.88  CALCIUM 9.9   GFR: Estimated Creatinine Clearance: 39 mL/min (by C-G formula based on SCr of 0.88 mg/dL). Liver Function Tests: No results for input(s): AST, ALT, ALKPHOS, BILITOT, PROT, ALBUMIN in the last 168 hours. No results for input(s): LIPASE, AMYLASE in the last 168 hours. No results for input(s): AMMONIA in the last 168 hours. Coagulation Profile: No results for input(s): INR, PROTIME in the last 168 hours. Cardiac Enzymes: No results for input(s): CKTOTAL, CKMB, CKMBINDEX, TROPONINI in the last 168 hours. BNP (last 3 results) No results for input(s): PROBNP in the last 8760 hours. HbA1C: No results for input(s): HGBA1C in the last 72 hours. CBG: No results for input(s): GLUCAP in the last 168 hours. Lipid Profile: No results for input(s): CHOL, HDL, LDLCALC, TRIG, CHOLHDL, LDLDIRECT in the last 72 hours. Thyroid Function Tests: No results for input(s): TSH, T4TOTAL, FREET4, T3FREE, THYROIDAB in the last 72 hours. Anemia Panel: No results  for input(s): VITAMINB12, FOLATE, FERRITIN, TIBC, IRON, RETICCTPCT in the last 72 hours. Urine analysis:    Component Value Date/Time   COLORURINE AMBER (A) 02/22/2024 2333   APPEARANCEUR CLOUDY (A) 02/22/2024 2333   LABSPEC 1.017 02/22/2024 2333   PHURINE 5.0 02/22/2024 2333   GLUCOSEU NEGATIVE 02/22/2024 2333   HGBUR NEGATIVE 02/22/2024 2333   BILIRUBINUR NEGATIVE 02/22/2024 2333   KETONESUR NEGATIVE 02/22/2024 2333   PROTEINUR NEGATIVE 02/22/2024 2333   UROBILINOGEN 0.2 07/11/2010 1400   NITRITE POSITIVE (A) 02/22/2024 2333   LEUKOCYTESUR LARGE (A) 02/22/2024 2333    Radiological Exams on Admission: DG Knee Complete 4 Views Left Result Date: 02/22/2024 EXAM: 4 VIEW(S) XRAY OF THE LEFT KNEE 02/22/2024 04:51:00 PM COMPARISON: 10/15/2023 CLINICAL HISTORY: fall FINDINGS: BONES AND JOINTS: Interval left total knee arthroplasty is in place. Small effusion in the suprapatellar bursa. No acute fracture. No focal osseous lesion. No joint dislocation. SOFT TISSUES: Femoropopliteal arterial calcifications. The soft tissues are otherwise unremarkable. IMPRESSION: 1. Interval left total knee arthroplasty in expected position. Electronically signed by: Morgane Naveau MD 02/22/2024 05:02 PM EST RP Workstation: [id number]    Assessment and Plan  Left knee pain secondary to a mechanical fall in the setting of generalized weakness History of total left knee arthroplasty on 12/31/2023 X-ray showing arthroplasty in expected position and no fracture or dislocation.  Knee sleeve applied in the ED.  Patient was unable to ambulate safely.  PT/OT eval, fall precautions.  TOC consulted for SNF placement.  Continue pain management.  UTI UA with positive nitrite, large leukocytes, 11-20 RBCs, >50 WBCs, and many bacteria.  No fever, leukocytosis, or signs of sepsis.  Continue ceftriaxone  and follow-up urine culture.  Acute metabolic encephalopathy Suspect this is related to UTI.  Do not think  urgent head imaging is warranted at this time as patient does not have any focal neurologic deficits on exam to suggest stroke and also no head injury reported from fall.  Per daughter, confusion has been an ongoing issue since her previous 2 hospitalizations.  Per review of chart, during her hospital admission in October, patient was noted to have hospital-acquired delirium and was referred to neurology on discharge due to concern for possible underlying dementia. Continue treatment  of UTI for now, monitor closely, and obtain brain imaging if not improving.  Delirium precautions.  Mild hypokalemia Monitor potassium and magnesium levels, continue to replace as needed.  Hypertension Antihypertensives were discontinued during recent admission due to concern for hypotension.  Not hypertensive at this time.  CKD stage II-IIIa Creatinine stable.  History of left renal mass CT done during recent admission was concerning for a left renal mass with tumor thrombus extending into a branch of the left renal vein highly concerning for renal cell carcinoma.  Urology had recommended outpatient follow-up.    Mood disorder Continue Depakote  GERD Continue PPI.  DVT prophylaxis: Lovenox  Code Status: DNR pre-arrest interventions desired (discussed with the patient's daughter) Family Communication: Daughter updated. Level of care: Telemetry bed Admission status: It is my clinical opinion that referral for OBSERVATION is reasonable and necessary in this patient based on the above information provided. The aforementioned taken together are felt to place the patient at high risk for further clinical deterioration. However, it is anticipated that the patient may be medically stable for discharge from the hospital within 24 to 48 hours.  Editha Ram MD [facility]  If 7PM-7AM, please contact night-coverage [email or web address]  02/23/2024, 3:23 AM

## 2024-02-23 NOTE — ED Notes (Addendum)
 Writer sent note to Dr. Laveta him know pt is still in moderate pain after pain administration.

## 2024-02-23 NOTE — ED Notes (Signed)
 Writer called floor and let them know pt is about to come upstairs.

## 2024-02-23 NOTE — ED Provider Notes (Signed)
 Results for orders placed or performed during the hospital encounter of 02/22/24  CBC with Differential   Collection Time: 02/22/24  8:12 PM  Result Value Ref Range   WBC 9.3 4.0 - 10.5 K/uL   RBC 3.89 3.87 - 5.11 MIL/uL   Hemoglobin 13.1 12.0 - 15.0 g/dL   HCT 61.4 63.9 - 53.9 %   MCV 99.0 80.0 - 100.0 fL   MCH 33.7 26.0 - 34.0 pg   MCHC 34.0 30.0 - 36.0 g/dL   RDW 86.7 88.4 - 84.4 %   Platelets 260 150 - 400 K/uL   nRBC 0.0 0.0 - 0.2 %   Neutrophils Relative % 78 %   Neutro Abs 7.3 1.7 - 7.7 K/uL   Lymphocytes Relative 9 %   Lymphs Abs 0.8 0.7 - 4.0 K/uL   Monocytes Relative 10 %   Monocytes Absolute 0.9 0.1 - 1.0 K/uL   Eosinophils Relative 2 %   Eosinophils Absolute 0.2 0.0 - 0.5 K/uL   Basophils Relative 0 %   Basophils Absolute 0.0 0.0 - 0.1 K/uL   Immature Granulocytes 1 %   Abs Immature Granulocytes 0.05 0.00 - 0.07 K/uL  Basic metabolic panel   Collection Time: 02/22/24  8:12 PM  Result Value Ref Range   Sodium 141 135 - 145 mmol/L   Potassium 3.3 (L) 3.5 - 5.1 mmol/L   Chloride 105 98 - 111 mmol/L   CO2 24 22 - 32 mmol/L   Glucose, Bld 102 (H) 70 - 99 mg/dL   BUN 19 8 - 23 mg/dL   Creatinine, Ser 9.11 0.44 - 1.00 mg/dL   Calcium 9.9 8.9 - 89.6 mg/dL   GFR, Estimated >39 >39 mL/min   Anion gap 12 5 - 15  Urinalysis, w/ Reflex to Culture (Infection Suspected) -Urine, Clean Catch   Collection Time: 02/22/24 11:33 PM  Result Value Ref Range   Specimen Source URINE, CLEAN CATCH    Color, Urine AMBER (A) YELLOW   APPearance CLOUDY (A) CLEAR   Specific Gravity, Urine 1.017 1.005 - 1.030   pH 5.0 5.0 - 8.0   Glucose, UA NEGATIVE NEGATIVE mg/dL   Hgb urine dipstick NEGATIVE NEGATIVE   Bilirubin Urine NEGATIVE NEGATIVE   Ketones, ur NEGATIVE NEGATIVE mg/dL   Protein, ur NEGATIVE NEGATIVE mg/dL   Nitrite POSITIVE (A) NEGATIVE   Leukocytes,Ua LARGE (A) NEGATIVE   RBC / HPF 11-20 0 - 5 RBC/hpf   WBC, UA >50 0 - 5 WBC/hpf   Bacteria, UA MANY (A) NONE SEEN    Squamous Epithelial / HPF 0-5 0 - 5 /HPF   Mucus PRESENT    Ca Oxalate Crys, UA PRESENT    DG Knee Complete 4 Views Left Result Date: 02/22/2024 EXAM: 4 VIEW(S) XRAY OF THE LEFT KNEE 02/22/2024 04:51:00 PM COMPARISON: 10/15/2023 CLINICAL HISTORY: fall FINDINGS: BONES AND JOINTS: Interval left total knee arthroplasty is in place. Small effusion in the suprapatellar bursa. No acute fracture. No focal osseous lesion. No joint dislocation. SOFT TISSUES: Femoropopliteal arterial calcifications. The soft tissues are otherwise unremarkable. IMPRESSION: 1. Interval left total knee arthroplasty in expected position. Electronically signed by: Morgane Naveau MD 02/22/2024 05:02 PM EST RP Workstation: [id number]   CT Angio Chest PE W and/or Wo Contrast Result Date: 02/14/2024 EXAM: CTA CHEST PE WITHOUT AND WITH CONTRAST CT ABDOMEN AND PELVIS WITH CONTRAST 02/14/2024 09:00:45 PM TECHNIQUE: CTA of the chest was performed after the administration of 100 mL of iohexol  (OMNIPAQUE ) 350 MG/ML injection. Multiplanar reformatted images  are provided for review. MIP images are provided for review. CT of the abdomen and pelvis was performed with the administration of intravenous contrast. Automated exposure control, iterative reconstruction, and/or weight based adjustment of the mA/kV was utilized to reduce the radiation dose to as low as reasonably achievable. COMPARISON: Comparison with same day chest x-ray and CT chest 11/21/2021. CLINICAL HISTORY: Pulmonary embolism (PE) suspected, high prob. Acute um localized abdominal pain. Cough. FINDINGS: CHEST: PULMONARY ARTERIES: Pulmonary arteries are adequately opacified for evaluation. No intraluminal filling defect to suggest pulmonary embolism. Main pulmonary artery is normal in caliber. MEDIASTINUM: No mediastinal lymphadenopathy. The heart and pericardium demonstrate no acute abnormality. Coronary artery and aortic atherosclerotic calcification. There is no acute abnormality of  the thoracic aorta. LUNGS AND PLEURA: Bronchial wall thickening with mucus plugging and bronchiectasis/bronchiolectasis in the lower lobes. No focal consolidation or pulmonary edema. No pleural effusion or pneumothorax. SOFT TISSUES AND BONES: No acute bone or soft tissue abnormality. ABDOMEN AND PELVIS: LIVER: The liver is unremarkable. GALLBLADDER AND BILE DUCTS: Distended gallbladder. No biliary ductal dilatation. SPLEEN: Spleen demonstrates no acute abnormality. 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 No pancreatic ductal dilation. ADRENAL GLANDS: Stable left adrenal adenoma. KIDNEYS, URETERS AND BLADDER: Punctate nonobstructing bilateral nephrolithiasis. Heterogeneously enhancing mass in the lower pole of the left kidney measures 4.7 cm. Tumor thrombus extends into an inferior branch of the left renal vein. No hydronephrosis. No perinephric or periureteral stranding. Urinary bladder is unremarkable. GI AND BOWEL: Diverticulosis without diverticulitis. Stomach and duodenal sweep demonstrate no acute abnormality. There is no bowel obstruction. No abnormal bowel wall thickening or distension. REPRODUCTIVE: 5.7 cm fibroid in the uterus. PERITONEUM AND RETROPERITONEUM: No ascites or free air. LYMPH NODES: No lymphadenopathy. BONES AND SOFT TISSUES: No acute abnormality of the visualized bones. No focal soft tissue abnormality. IMPRESSION: 1. No pulmonary embolism. 2. Findings consistent with chronic bronchitis. 3. 4.7 cm enhancing mass in the lower pole of the left kidney highly suspicious for renal cell carcinoma. There is tumor thrombus extending into an inferior branch of the left renal vein; recommend urology consultation. 4. 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 Follow up as appropriate given age and comorbidities. Electronically signed by: Norman Gatlin MD  02/14/2024 09:22 PM EST RP Workstation: [id number]   CT ABDOMEN PELVIS W CONTRAST Result Date: 02/14/2024 EXAM: CTA CHEST PE WITHOUT AND WITH CONTRAST CT ABDOMEN AND PELVIS WITH CONTRAST 02/14/2024 09:00:45 PM TECHNIQUE: CTA of the chest was performed after the administration of 100 mL of iohexol  (OMNIPAQUE ) 350 MG/ML injection. Multiplanar reformatted images are provided for review. MIP images are provided for review. CT of the abdomen and pelvis was performed with the administration of intravenous contrast. Automated exposure control, iterative reconstruction, and/or weight based adjustment of the mA/kV was utilized to reduce the radiation dose to as low as reasonably achievable. COMPARISON: Comparison with same day chest x-ray and CT chest 11/21/2021. CLINICAL HISTORY: Pulmonary embolism (PE) suspected, high prob. Acute um localized abdominal pain. Cough. FINDINGS: CHEST: PULMONARY ARTERIES: Pulmonary arteries are adequately opacified for evaluation. No intraluminal filling defect to suggest pulmonary embolism. Main pulmonary artery is normal in caliber. MEDIASTINUM: No mediastinal lymphadenopathy. The heart and pericardium demonstrate no acute abnormality. Coronary artery and aortic atherosclerotic calcification. There is no acute abnormality of the thoracic aorta. LUNGS AND PLEURA: Bronchial wall thickening with mucus plugging and bronchiectasis/bronchiolectasis in the lower lobes. No  focal consolidation or pulmonary edema. No pleural effusion or pneumothorax. SOFT TISSUES AND BONES: No acute bone or soft tissue abnormality. ABDOMEN AND PELVIS: LIVER: The liver is unremarkable. GALLBLADDER AND BILE DUCTS: Distended gallbladder. No biliary ductal dilatation. SPLEEN: Spleen demonstrates no acute abnormality. 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 No pancreatic ductal dilation. ADRENAL GLANDS: Stable left adrenal adenoma. KIDNEYS, URETERS AND BLADDER:  Punctate nonobstructing bilateral nephrolithiasis. Heterogeneously enhancing mass in the lower pole of the left kidney measures 4.7 cm. Tumor thrombus extends into an inferior branch of the left renal vein. No hydronephrosis. No perinephric or periureteral stranding. Urinary bladder is unremarkable. GI AND BOWEL: Diverticulosis without diverticulitis. Stomach and duodenal sweep demonstrate no acute abnormality. There is no bowel obstruction. No abnormal bowel wall thickening or distension. REPRODUCTIVE: 5.7 cm fibroid in the uterus. PERITONEUM AND RETROPERITONEUM: No ascites or free air. LYMPH NODES: No lymphadenopathy. BONES AND SOFT TISSUES: No acute abnormality of the visualized bones. No focal soft tissue abnormality. IMPRESSION: 1. No pulmonary embolism. 2. Findings consistent with chronic bronchitis. 3. 4.7 cm enhancing mass in the lower pole of the left kidney highly suspicious for renal cell carcinoma. There is tumor thrombus extending into an inferior branch of the left renal vein; recommend urology consultation. 4. 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 Follow up as appropriate given age and comorbidities. Electronically signed by: Norman Gatlin MD 02/14/2024 09:22 PM EST RP Workstation: [id number]   DG Chest Portable 1 View Result Date: 02/14/2024 CLINICAL DATA:  Cough. EXAM: PORTABLE CHEST 1 VIEW COMPARISON:  Chest recommended 04/27/2022. FINDINGS: Shallow inspiration. No focal consolidation, pleural effusion or pneumothorax. The cardiac silhouette is within limits. Atherosclerotic calcification of the aorta. No acute osseous pathology. Partially visualized right shoulder arthroplasty. IMPRESSION: No active disease. Electronically Signed   By: Vanetta Chou M.D.   On: 02/14/2024 18:03   UA appears infectious.  Culture pending.  Started IV rocephin .  Spoke with Dr. Alfornia-- will admit for ongoing care.    Jarold Olam HERO, PA-C 02/23/24 0302    Trine Raynell Moder, MD 02/23/24 [id number]

## 2024-02-23 NOTE — Evaluation (Addendum)
 Occupational Therapy Evaluation Patient Details Name: Gina [name] MRN: [id number] DOB: 1932/09/29 Today's Date: 02/23/2024   History of Present Illness   Gina [name] is a 89 y.o. female presened to the ED for evaluation of left knee pain after a fall. Found to have UTI and acute metabolic encephalopathy. 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  Recently admitted to the hospital 10/21-10/25 for acute metabolic encephalopathy/delirium secondary to UTI/severe sepsis and SVT.  She was started on metoprolol.  Admitted again 11/6-11/7 for acute metabolic encephalopathy, SIRS, and left renal mass.     Clinical Impressions This 89 yo female admitted with above presents to acute OT with PLOF of just getting home from rehab and falling as she was trying to get in the door. Per pt when she left rehab that day she did dress herself. Currently pt is setup-Mod A for basic ADLs from a RW standpoint. She will continue to benefit from acute OT with follow up OT  from continued inpatient follow up therapy, <3 hours/day.      If plan is discharge home, recommend the following:   A little help with bathing/dressing/bathroom;A lot of help with bathing/dressing/bathroom;Assistance with cooking/housework;Assist for transportation;Help with stairs or ramp for entrance     Functional Status Assessment   Patient has had a recent decline in their functional status and demonstrates the ability to make significant improvements in function in a reasonable and predictable amount of time.     Equipment Recommendations   BSC/3in1      Precautions/Restrictions   Precautions Precautions: Fall Required Braces or Orthoses: Other Brace Other Brace: neoprene knee brace on left knee Restrictions Weight Bearing Restrictions Per Provider Order: No     Mobility Bed Mobility Overal bed mobility: Needs Assistance Bed Mobility: Supine to Sit, Sit to Supine      Supine to sit: Min assist, Mod assist, HOB elevated Sit to supine: Total assist, +2 for physical assistance   General bed mobility comments: CGA to come up sit on stretcher, Mod A to scoot forward to edge of stretcher; total A +2 from sitting to supine on stretcher due to stretcher height and pt only 5'3.    Transfers Overall transfer level: Needs assistance Equipment used: Rolling walker (2 wheels) Transfers: Sit to/from Stand Sit to Stand: Min assist, From elevated surface           General transfer comment: increased time      Balance Overall balance assessment: Needs assistance Sitting-balance support: No upper extremity supported, Feet supported Sitting balance-Leahy Scale: Fair     Standing balance support: Bilateral upper extremity supported, Reliant on assistive device for balance Standing balance-Leahy Scale: Poor                             ADL either performed or assessed with clinical judgement   ADL Overall ADL's : Needs assistance/impaired Eating/Feeding: Independent;Sitting Eating/Feeding Details (indicate cue type and reason): in chair Grooming: Set up;Sitting Grooming Details (indicate cue type and reason): in chair     Lower Body Bathing: Moderate assistance Lower Body Bathing Details (indicate cue type and reason): min A sit<>stand Upper Body Dressing : Set up;Sitting Upper Body Dressing Details (indicate cue type and reason): in chair Lower Body Dressing: Moderate assistance Lower Body Dressing Details (indicate cue type and reason): min A sit<>stand Toilet Transfer: Minimal assistance;Stand-pivot;Rolling walker (2 wheels) Toilet Transfer Details (indicate cue  type and reason): simulated stretcher to chair next to stretcher Toileting- Clothing Manipulation and Hygiene: Total assistance Toileting - Clothing Manipulation Details (indicate cue type and reason): min A sit<>stand             Vision Patient Visual Report: No change  from baseline              Pertinent Vitals/Pain Pain Assessment Pain Assessment: Faces Faces Pain Scale: Hurts even more Pain Location: left knee at rest with increasing pain with movement and WB'ing Pain Descriptors / Indicators: Aching, Sore, Guarding, Grimacing Pain Intervention(s): Limited activity within patient's tolerance, Monitored during session, Repositioned     Extremity/Trunk Assessment Upper Extremity Assessment Upper Extremity Assessment: Right hand dominant;RUE deficits/detail RUE Deficits / Details: Chronic R shoulder AROM limitations (pt's daughter reported a history of shoulder replacement). Elbow and hand AROM WFL. Grip strength grossly 4-/5 LUE Deficits / Details: AROM WFL           Communication Communication Communication: Impaired Factors Affecting Communication: Hearing impaired   Cognition Arousal: Alert Behavior During Therapy: WFL for tasks assessed/performed               OT - Cognition Comments: not oriented to year  2558 but was oriented to month, place and why she is here.                 Following commands: Intact       Cueing    Cueing Techniques: Verbal cues;Tactile cues              Home Living Family/patient expects to be discharged to:: Private residence Living Arrangements: Alone Available Help at Discharge: Family;Available 24 hours/day (if goes to dtr's house) Type of Home: House Home Access: Stairs to enter [employer] of Steps: 5 Entrance Stairs-Rails: Left Home Layout: Two level;Able to live on main level with bedroom/bathroom Alternate Level Stairs-Number of Steps: main and upper level to home; she resides on the main level of the home   Bathroom Shower/Tub: Chief Strategy Officer: Standard     Home Equipment: Agricultural Consultant (2 wheels);Cane - single point;BSC/3in1;Tub bench          Prior Functioning/Environment Prior Level of Function : Needs assist              Mobility Comments: was independent up until knee sx in August of 2025      OT Problem List: Decreased strength;Decreased range of motion;Decreased activity tolerance;Impaired balance (sitting and/or standing);Pain   OT Treatment/Interventions: DME and/or AE instruction;Balance training;Self-care/ADL training      OT Goals(Current goals can be found in the care plan section)   Acute Rehab OT Goals Patient Stated Goal: for knee not to hurt OT Goal Formulation: With patient/family Time For Goal Achievement: 03/08/24 Potential to Achieve Goals: Good ADL Goals Pt Will Perform Grooming: Independently;standing Pt Will Perform Upper Body Bathing: Independently;sitting;standing Pt Will Perform Lower Body Bathing: with modified independence;with adaptive equipment;sit to/from stand Pt Will Perform Upper Body Dressing: with modified independence;sitting;standing Pt Will Perform Lower Body Dressing: with modified independence;sit to/from stand Pt Will Transfer to Toilet: with modified independence;ambulating;bedside commode (over toielt) Pt Will Perform Toileting - Clothing Manipulation and hygiene: with modified independence;sit to/from stand Additional ADL Goal #1: Pt will be Mod I in and OOB for basic ADLs (increased time)   OT Frequency:  Min 2X/week       AM-PAC OT 6 Clicks Daily Activity     Outcome Measure Help  from another person eating meals?: None Help from another person taking care of personal grooming?: A Little Help from another person toileting, which includes using toliet, bedpan, or urinal?: A Lot Help from another person bathing (including washing, rinsing, drying)?: A Lot Help from another person to put on and taking off regular upper body clothing?: A Little Help from another person to put on and taking off regular lower body clothing?: A Lot 6 Click Score: 16   End of Session Equipment Utilized During Treatment: Rolling walker (2 wheels) Nurse Communication:  Mobility status (to NT)  Activity Tolerance: Patient limited by pain Patient left: in bed;with call bell/phone within reach (both siderails up on stretcher)  OT Visit Diagnosis: Unsteadiness on feet (R26.81);Other abnormalities of gait and mobility (R26.89);Repeated falls (R29.6);History of falling (Z91.81);Pain Pain - Right/Left: Left Pain - part of body: Knee                Time: 1115-1150 OT Time Calculation (min): 35 min Charges:  OT General Charges $OT Visit: 1 Visit OT Evaluation $OT Eval Moderate Complexity: 1 Mod OT Treatments $Self Care/Home Management : 8-22 mins  Donny BECKER OT Acute Rehabilitation Services Office [phone]    Rodgers Dorothyann Distel 02/23/2024, 1:26 PM

## 2024-02-23 NOTE — Evaluation (Signed)
 Physical Therapy Evaluation Patient Details Name: Gina [name] MRN: [id number] DOB: 1933-02-16 Today's Date: 02/23/2024  History of Present Illness  Gina [name] is a 89 y.o. female presened to the ED for evaluation of left knee pain after a fall on 02/22/24. Found to have UTI and acute metabolic encephalopathy. Pt had recent L TKA (12/31/23), imaging after fall negative for acute injury.  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  Recently admitted to the hospital 10/21-10/25 for acute metabolic encephalopathy/delirium secondary to UTI/severe sepsis and SVT.  She was started on metoprolol.  Admitted again 11/6-11/7 for acute metabolic encephalopathy, SIRS, and left renal mass.  Clinical Impression  Pt admitted with above diagnosis. At baseline pt was independent per prior notes - pt poor historian today.  She had recent TKA and was walking at SNF with RW but at d/c home, knee buckled, she fell, and returned to hospital.  Today, pt requiring min-mod A for transfers and only able to take a couple steps at EOB.  Limited by pain and self limiting.  Pt currently with functional limitations due to the deficits listed below (see PT Problem List). Pt will benefit from acute skilled PT to increase their independence and safety with mobility to allow discharge.  Patient will benefit from continued inpatient follow up therapy, <3 hours/day at d/c.          If plan is discharge home, recommend the following: Assistance with cooking/housework;Assist for transportation;Help with stairs or ramp for entrance;A lot of help with walking and/or transfers;A lot of help with bathing/dressing/bathroom   Can travel by private vehicle   Yes    Equipment Recommendations Wheelchair cushion (measurements PT);Wheelchair (measurements PT)  Recommendations for Other Services       Functional Status Assessment Patient has had a recent decline in their functional status and  demonstrates the ability to make significant improvements in function in a reasonable and predictable amount of time.     Precautions / Restrictions Precautions Precautions: Fall Other Brace: neoprene knee brace on left knee      Mobility  Bed Mobility Overal bed mobility: Needs Assistance Bed Mobility: Supine to Sit, Sit to Supine     Supine to sit: Mod assist Sit to supine: Mod assist   General bed mobility comments: Pt now on bed requiring railing, HOB elevated, and mod A for transfers    Transfers Overall transfer level: Needs assistance Equipment used: Rolling walker (2 wheels) Transfers: Sit to/from Stand Sit to Stand: Mod assist, +2 safety/equipment           General transfer comment: Mod A from low bed, +2 for safety    Ambulation/Gait Ambulation/Gait assistance: Min assist Gait Distance (Feet): 2 Feet Assistive device: Rolling walker (2 wheels) Gait Pattern/deviations: Step-to pattern, Decreased weight shift to left Gait velocity: decreased     General Gait Details: Pt asking to sit immediatly upon standing due to L knee pain (no signs of severe pain).  Did take some side steps at EOB with encouragment  Stairs            Wheelchair Mobility     Tilt Bed    Modified Rankin (Stroke Patients Only)       Balance Overall balance assessment: Needs assistance Sitting-balance support: No upper extremity supported, Feet supported Sitting balance-Leahy Scale: Fair     Standing balance support: Bilateral upper extremity supported, Reliant on assistive device for balance Standing balance-Leahy Scale: Poor Standing  balance comment: RW and min A static stand                             Pertinent Vitals/Pain Pain Assessment Pain Assessment: Faces Faces Pain Scale: Hurts even more Pain Location: L knee with full extension and weight bearing Pain Descriptors / Indicators: Aching, Sore, Guarding, Grimacing Pain Intervention(s): Limited  activity within patient's tolerance, Monitored during session, Repositioned, Other (comment) (knee sleeve in place)    Home Living Family/patient expects to be discharged to:: Private residence Living Arrangements: Alone Available Help at Discharge: Family;Available 24 hours/day (if goes to daughter's house) Type of Home: House Home Access: Stairs to enter Entrance Stairs-Rails: Left Entrance Stairs-Number of Steps: 5 Alternate Level Stairs-Number of Steps: main and upper level to home; she resides on the main level of the home Home Layout: Two level;Able to live on main level with bedroom/bathroom Home Equipment: Rolling Walker (2 wheels);Cane - single point;BSC/3in1;Tub bench Additional Comments: Pt with recent TKA and went to SNF for rehab.  She had just returned home and fell - knee buckled without warning    Prior Function Prior Level of Function : Needs assist             Mobility Comments: was independent up until knee sx in 12/31/23; walking with walker at SNF and had just returned home but fell ADLs Comments: Per prior note, pt limited historian today: Prior to her L knee replacement in September, the pt required assist for spongebathing and occasional assist for toileting hygiene. She could dress herself, though with some difficulty. Her daughter completed the laundry & household cleaning.     Extremity/Trunk Assessment   Upper Extremity Assessment Upper Extremity Assessment: Defer to OT evaluation RUE Deficits / Details: Chronic R shoulder AROM limitations (pt's daughter reported a history of shoulder replacement). Elbow and hand AROM WFL. Grip strength grossly 4-/5 LUE Deficits / Details: AROM WFL    Lower Extremity Assessment Lower Extremity Assessment: LLE deficits/detail;RLE deficits/detail RLE Deficits / Details: ROM WFL; MMT grossly 4+/5 LLE Deficits / Details: Recent TKA, reports was moving better but now painful after fall, ROM ~5 to 60 degrees limited by pain.   Ankle and hip ROM WFL.  MMT ankle 5/5, knee and hip 3/5 not further tested due to knee pain    Cervical / Trunk Assessment Cervical / Trunk Assessment: Kyphotic  Communication   Communication Communication: Impaired Factors Affecting Communication: Hearing impaired    Cognition Arousal: Alert Behavior During Therapy: WFL for tasks assessed/performed   PT - Cognitive impairments: History of cognitive impairments, No family/caregiver present to determine baseline                       PT - Cognition Comments: Pt not providing detailed hx today, needs enrouragement, that it was 2 am not 2 pm         Cueing       General Comments General comments (skin integrity, edema, etc.): VSS.  Spoke with OT who reports spoke with daughter who states has health issues of her own at this time and difficulty managing pt's current physical needs.    Exercises     Assessment/Plan    PT Assessment Patient needs continued PT services  PT Problem List Decreased strength;Decreased activity tolerance;Decreased balance;Decreased mobility;Decreased cognition;Decreased knowledge of use of DME;Decreased safety awareness;Decreased range of motion       PT Treatment Interventions DME instruction;Gait  training;Functional mobility training;Therapeutic activities;Therapeutic exercise;Balance training;Neuromuscular re-education;Patient/family education;Modalities;Stair training    PT Goals (Current goals can be found in the Care Plan section)  Acute Rehab PT Goals Patient Stated Goal: SNF then home PT Goal Formulation: With patient Time For Goal Achievement: 03/08/24 Potential to Achieve Goals: Good    Frequency Min 2X/week     Co-evaluation               AM-PAC PT 6 Clicks Mobility  Outcome Measure Help needed turning from your back to your side while in a flat bed without using bedrails?: A Lot Help needed moving from lying on your back to sitting on the side of a flat bed  without using bedrails?: A Lot Help needed moving to and from a bed to a chair (including a wheelchair)?: A Lot Help needed standing up from a chair using your arms (e.g., wheelchair or bedside chair)?: A Lot Help needed to walk in hospital room?: Total Help needed climbing 3-5 steps with a railing? : Total 6 Click Score: 10    End of Session Equipment Utilized During Treatment: Gait belt Activity Tolerance: Patient limited by fatigue (requesting to rest after standing) Patient left: in bed;with call bell/phone within reach;with bed alarm set Nurse Communication: Mobility status PT Visit Diagnosis: Unsteadiness on feet (R26.81);Other abnormalities of gait and mobility (R26.89);Muscle weakness (generalized) (M62.81)    Time: 8591-8576 PT Time Calculation (min) (ACUTE ONLY): 15 min   Charges:   PT Evaluation $PT Eval Low Complexity: 1 Low   PT General Charges $$ ACUTE PT VISIT: 1 Visit         Benjiman, PT Acute Rehab [facility] [phone]   Benjiman VEAR Mulberry 02/23/2024, 2:55 PM

## 2024-02-24 DIAGNOSIS — E876 Hypokalemia: Secondary | ICD-10-CM | POA: Diagnosis not present

## 2024-02-24 DIAGNOSIS — W19XXXA Unspecified fall, initial encounter: Secondary | ICD-10-CM | POA: Diagnosis not present

## 2024-02-24 DIAGNOSIS — G9341 Metabolic encephalopathy: Secondary | ICD-10-CM | POA: Diagnosis not present

## 2024-02-24 DIAGNOSIS — N3000 Acute cystitis without hematuria: Secondary | ICD-10-CM | POA: Diagnosis not present

## 2024-02-24 LAB — BASIC METABOLIC PANEL WITH GFR
Anion gap: 8 (ref 5–15)
BUN: 16 mg/dL (ref 8–23)
CO2: 25 mmol/L (ref 22–32)
Calcium: 9.4 mg/dL (ref 8.9–10.3)
Chloride: 104 mmol/L (ref 98–111)
Creatinine, Ser: 0.81 mg/dL (ref 0.44–1.00)
GFR, Estimated: >60 mL/min (ref >60)
Glucose, Bld: 73 mg/dL (ref 70–99)
Potassium: 3.9 mmol/L (ref 3.5–5.1)
Sodium: 137 mmol/L (ref 135–145)

## 2024-02-24 NOTE — Plan of Care (Signed)

## 2024-02-24 NOTE — TOC Initial Note (Signed)
 Transition of Care [facility]) - Initial/Assessment Note    Patient Details  Name: Gina [name] MRN: [id number] Date of Birth: 27-Jul-1932  [facility]) CM/SW Contact:    Jon ONEIDA Anon, RN Phone Number: 02/24/2024, 4:10 PM  Clinical Narrative:                 Pt from home. Recently discharged to [facility]. Admitted after witnessed fall at home. PT/OT rec STR. NCM spoke with pt daughter [name] and she is agreeable to recommendation. Would prefer pt return to [facility] for STR if possible. Pt will likely be in copay days. RNCM sent out SNF referrals, awaiting bed offers. IP CM will continue to follow to assist in DC needs.     Expected Discharge Plan: Skilled Nursing Facility Barriers to Discharge: SNF Pending bed offer   Patient Goals and CMS Choice Patient states their goals for this hospitalization and ongoing recovery are:: To go to SNF for STR CMS Medicare.gov Compare Post Acute Care list provided to:: Patient Represenative (must comment) (Pt daughter [name]) Choice offered to / list presented to : Adult Children Crane ownership interest in [facility].provided to:: Adult Children    Expected Discharge Plan and Services In-house Referral: NA Discharge Planning Services: CM Consult Post Acute Care Choice: Skilled Nursing Facility Living arrangements for the past 2 months: Skilled Nursing Facility, Single Family Home                 DME Arranged: N/A DME Agency: NA       HH Arranged: NA HH Agency: NA        Prior Living Arrangements/Services Living arrangements for the past 2 months: Skilled Holiday Representative, Single Family Home Lives with:: Facility Resident, Relatives Patient language and need for interpreter reviewed:: Yes Do you feel safe going back to the place where you live?: Yes      Need for Family Participation in Patient Care: Yes (Comment) Care giver support system in place?: Yes (comment) Current home services: DME Criminal  Activity/Legal Involvement Pertinent to Current Situation/Hospitalization: No - Comment as needed  Activities of Daily Living   ADL Screening (condition at time of admission) Independently performs ADLs?: No Does the patient have a NEW difficulty with bathing/dressing/toileting/self-feeding that is expected to last >3 days?: No Does the patient have a NEW difficulty with getting in/out of bed, walking, or climbing stairs that is expected to last >3 days?: Yes (Initiates electronic notice to provider for possible PT consult) Does the patient have a NEW difficulty with communication that is expected to last >3 days?: No Is the patient deaf or have difficulty hearing?: Yes Does the patient have difficulty seeing, even when wearing glasses/contacts?: No Does the patient have difficulty concentrating, remembering, or making decisions?: Yes  Permission Sought/Granted Permission sought to share information with : Family Supports Permission granted to share information with : Yes, Verbal Permission Granted  Share Information with NAME: Geralene [name] (Daughter)  [phone]           Emotional Assessment Appearance:: Appears stated age Attitude/Demeanor/Rapport: Engaged Affect (typically observed): Accepting Orientation: : Oriented to Self, Oriented to Place, Oriented to Situation Alcohol  / Substance Use: Not Applicable Psych Involvement: No (comment)  Admission diagnosis:  Left knee pain [M25.562] Fall, initial encounter [W19.XXXA] Acute pain of left knee [M25.562] Patient Active Problem List   Diagnosis Date Noted   UTI (urinary tract infection) 02/23/2024   Left renal mass 02/15/2024   DNR (do not resuscitate) 02/15/2024  OA (osteoarthritis) of knee 12/31/2023   Primary osteoarthritis of left knee 12/31/2023   CKD stage 3a, GFR 45-59 ml/min (HCC) - baseline Scr 1-1.2 11/21/2021   Left knee pain 11/21/2021   Acute metabolic encephalopathy 11/21/2021   SIRS (systemic  inflammatory response syndrome) (HCC) 06/12/2020   Fall at home, initial encounter 06/12/2020   Hypotension 06/12/2020   Hypokalemia 06/12/2020   Weakness 06/12/2020   PCP:  Teresa Thersia Jansky, MD Pharmacy:   [facility] - [place], [place] - [address] [place] [address] Phone: [phone] Fax: [phone]  [id number] [place], [place] - [address] AT [facility] OF [address] [place] [address] Phone: [phone] Fax: [phone]  [facility] [address] [place] [address] Phone: [phone] Fax: [phone]  [facility] [address] [place] [address] Phone: [phone] Fax: [phone]     Social Drivers of Health (SDOH) Social History: SDOH Screenings   Food Insecurity: No Food Insecurity (02/23/2024)  Housing: Low Risk  (02/23/2024)  Transportation Needs: No Transportation Needs (02/23/2024)  Utilities: Not At Risk (02/23/2024)  Financial Resource Strain: Low Risk  (07/29/2019)   Received from [facility] visits prior to 06/10/2022.  Physical Activity: Sufficiently Active (07/29/2019)   Received from [facility] visits prior to 06/10/2022.  Social Connections: Moderately Integrated (02/23/2024)  Stress: No Stress Concern Present (07/29/2019)   Received from [facility] visits prior to 06/10/2022.  Tobacco Use: Low Risk  (02/22/2024)   SDOH Interventions:     Readmission Risk Interventions     No data to display

## 2024-02-24 NOTE — Care Management Obs Status (Signed)
 MEDICARE OBSERVATION STATUS NOTIFICATION   Patient Details  Name: Gina [name] MRN: [id number] Date of Birth: [birth year]   Medicare Observation Status Notification Given:  Yes    Jon ONEIDA Anon, RN 02/24/2024, 3:44 PM

## 2024-02-24 NOTE — [place] FL2 (Signed)
 [place]  MEDICAID FL2 LEVEL OF CARE FORM     IDENTIFICATION  Patient Name: Gina [name] Birthdate: [birth year] Sex: female Admission Date (Current Location): 02/22/2024  [facility] and [place] Number:  Erik   Facility and Address:  [facility],  [address], [place] [address]      Provider Number: [id number]  Attending Physician Name and Address:  Lue Elsie BROCKS, MD  Relative Name and Phone Number:  Geralene Hoose (Daughter)  [phone]    Current Level of Care: Hospital Recommended Level of Care: Skilled Nursing Facility Prior Approval Number:    Date Approved/Denied:   PASRR Number: [id number]  Discharge Plan: SNF    Current Diagnoses: Patient Active Problem List   Diagnosis Date Noted   UTI (urinary tract infection) 02/23/2024   Left renal mass 02/15/2024   DNR (do not resuscitate) 02/15/2024   OA (osteoarthritis) of knee 12/31/2023   Primary osteoarthritis of left knee 12/31/2023   CKD stage 3a, GFR 45-59 ml/min (HCC) - baseline Scr 1-1.2 11/21/2021   Left knee pain 11/21/2021   Acute metabolic encephalopathy 11/21/2021   SIRS (systemic inflammatory response syndrome) (HCC) 06/12/2020   Fall at home, initial encounter 06/12/2020   Hypotension 06/12/2020   Hypokalemia 06/12/2020   Weakness 06/12/2020    Orientation RESPIRATION BLADDER Height & Weight     Self, Situation, Place  Normal Incontinent Weight: 68 kg Height:  5' 5 (165.1 cm)  BEHAVIORAL SYMPTOMS/MOOD NEUROLOGICAL BOWEL NUTRITION STATUS      Continent Diet (Heart Healthy, Thin Liquids)  AMBULATORY STATUS COMMUNICATION OF NEEDS Skin   Extensive Assist Verbally Other (Comment), PU Stage and Appropriate Care (Pressure Injury Sacrum Mid Stage 4)                       Personal Care Assistance Level of Assistance  Bathing, Feeding, Dressing Bathing Assistance: Limited assistance Feeding assistance: Limited assistance Dressing Assistance: Limited assistance      Functional Limitations Info  Sight, Hearing, Speech Sight Info: Impaired Financial Trader) Hearing Info: Adequate Speech Info: Adequate    SPECIAL CARE FACTORS FREQUENCY  PT (By licensed PT), OT (By licensed OT)     PT Frequency: 5x/wk OT Frequency: 5x/wk            Contractures Contractures Info: Not present    Additional Factors Info  Code Status, Allergies Code Status Info: DNR Allergies Info: Codeine, Strawberry Extract           Current Medications (02/24/2024):  This is the current hospital active medication list Current Facility-Administered Medications  Medication Dose Route Frequency Provider Last Rate Last Admin   acetaminophen  (TYLENOL ) tablet 1,000 mg  1,000 mg Oral Q8H PRN Lue Elsie BROCKS, MD   1,000 mg at 02/24/24 0933   cefTRIAXone  (ROCEPHIN ) 1 g in sodium chloride  0.9 % 100 mL IVPB  1 g Intravenous Q24H Rathore, Vasundhra, MD 200 mL/hr at 02/24/24 0111 1 g at 02/24/24 0111   divalproex (DEPAKOTE) DR tablet 125 mg  125 mg Oral QPM Rathore, Vasundhra, MD   125 mg at 02/23/24 1730   enoxaparin  (LOVENOX ) injection 40 mg  40 mg Subcutaneous Q24H Rathore, Vasundhra, MD   40 mg at 02/24/24 0934   naphazoline-pheniramine (NAPHCON-A) 0.025-0.3 % ophthalmic solution 1 drop  1 drop Both Eyes Daily Lue Elsie BROCKS, MD   1 drop at 02/24/24 0934   Oral care mouth rinse  15 mL Mouth Rinse PRN Lue Elsie BROCKS, MD  pantoprazole  (PROTONIX ) EC tablet 40 mg  40 mg Oral Daily Rathore, Vasundhra, MD   40 mg at 02/24/24 9065   traMADol  (ULTRAM ) tablet 50-100 mg  50-100 mg Oral Q8H PRN Rathore, Vasundhra, MD   50 mg at 02/24/24 0441     Discharge Medications: Please see discharge summary for a list of discharge medications.  Relevant Imaging Results:  Relevant Lab Results:   Additional Information SSN: [id number]  Jon ONEIDA Anon, RN

## 2024-02-24 NOTE — Progress Notes (Signed)
 Mobility Specialist - Progress Note   02/24/24 0956  Mobility  Activity Pivoted/transferred from bed to chair  Level of Assistance Moderate assist, patient does 50-74%  Assistive Device Front wheel walker  Distance Ambulated (ft) 2 ft  Range of Motion/Exercises Active  Activity Response Tolerated fair  Mobility visit 1 Mobility  Mobility Specialist Start Time (ACUTE ONLY) 0945  Mobility Specialist Stop Time (ACUTE ONLY) 0956  Mobility Specialist Time Calculation (min) (ACUTE ONLY) 11 min   Pt was found in bed and agreeable to mobilize. Grew fatigued with session. At EOS was left on recliner chair with all needs met. Call bell in reach and chair alarm on.   Erminio Leos,  Mobility Specialist Can be reached via Secure Chat

## 2024-02-24 NOTE — Plan of Care (Signed)
  Problem: Clinical Measurements: Goal: Respiratory complications will improve Outcome: Progressing Goal: Cardiovascular complication will be avoided Outcome: Progressing   Problem: Activity: Goal: Risk for activity intolerance will decrease Outcome: Progressing   Problem: Coping: Goal: Level of anxiety will decrease Outcome: Progressing   Problem: Elimination: Goal: Will not experience complications related to urinary retention Outcome: Progressing   

## 2024-02-24 NOTE — Progress Notes (Signed)
 PROGRESS NOTE    Gina [name]  FMW:[id number] DOB: [birth year] DOA: 02/22/2024 PCP: Teresa Thersia Jansky, MD   Brief Narrative:  Patient is a pleasant 89 year old female with history of hypertension CKD 3A left total knee replacement recently on 9/22 previously admitted to our facility late October for sepsis UTI SVT and encephalopathy.  She was admitted again on 11/6 for recurrent episodes of encephalopathy with left renal mass concerning for renal cell carcinoma recommendation for outpatient follow-up and discharged to SNF.  Patient was discharged from SNF 24 hours prior to presenting to our facility after what appears to be a collapse and fall without loss of consciousness.  There was no mechanical fall, as patient did not trip, however her legs gave out as her family was helping her out of the car.  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 - Recent left knee arthroplasty appears stable on imaging   Presumed UTI -Due to abnormal UA and symptoms of encephalopathy  - Follow culture, continue ceftriaxone  x 3 days -stop date 02/25/2024  Acute encephalopathy - Likely secondary to UTI as above, no syncope prior to hospitalization, patient and family deny any head strike during fall - Hold off on repeat imaging given patient's recent radiation burden and ongoing improvement with supportive care   Mild hypokalemia -replete as appropriate, likely secondary to poor p.o. intake   Hypertension -recently discontinued for concern of orthostatics/hypotension CKD stage II-IIIa - Creatinine stable.   Recently discovered left renal mass of unspecified etiology - Recent CT at our facility shows left renal mass with potential thrombus extending into the left renal vein concerning for renal cell carcinoma -Urology recommending outpatient follow-up   Mood disorder, unspecified continue Depakote GERD Continue PPI.  DVT prophylaxis: enoxaparin  (LOVENOX ) injection 40 mg Start: 02/23/24 1000 Code Status:   Code Status: Do not attempt resuscitation (DNR) PRE-ARREST INTERVENTIONS DESIRED Family Communication: At bedside  Status is: Observation  Dispo: The patient is from: Home              Anticipated d/c is to: To be determined              Anticipated d/c date is: To be determined              Patient currently is medically stable for discharge awaiting safe disposition at SNF  Consultants:  None  Procedures:  None  Antimicrobials:  Ceftriaxone   Subjective: No acute issues or events overnight  Objective: Vitals:   02/23/24 1251 02/23/24 2043 02/24/24 0114 02/24/24 0443  BP: (!) 106/43 (!) 123/52 (!) 116/49 136/63  Pulse: 70 71 66 67  Resp: 16 20 20 16   Temp: (!) 97.3 F (36.3 C) 98 F (36.7 C) 97.8 F (36.6 C) 98 F (36.7 C)  TempSrc:  Oral Oral Oral  SpO2: 99% 97% 98% 98%  Weight:      Height:        Intake/Output Summary (Last 24 hours) at 02/24/2024 0742 Last data filed at 02/24/2024 0600 Gross per 24 hour  Intake 100 ml  Output --  Net 100 ml   Filed Weights   02/22/24 1634  Weight: 68 kg    Examination:  General:  Pleasantly resting in bed, No acute distress. HEENT:  Normocephalic atraumatic.  Sclerae nonicteric, noninjected.  Extraocular movements intact bilaterally. Neck:  Without mass or deformity.  Trachea is midline. Lungs:  Clear to auscultate bilaterally without rhonchi, wheeze, or rales. Heart:  Regular rate and rhythm.   Without murmurs, rubs, or gallops. Abdomen:  Soft, nontender, nondistended.  Without guarding or rebound. Extremities: Without cyanosis, clubbing, edema, or obvious deformity. Skin:  Warm and dry, no erythema.   Data Reviewed: I have personally reviewed following labs and imaging studies  CBC: Recent Labs  Lab 02/22/24 2012  WBC 9.3  NEUTROABS 7.3  HGB 13.1  HCT 38.5  MCV 99.0  PLT 260   Basic Metabolic Panel: Recent Labs  Lab 02/22/24 2012 02/23/24 0537 02/24/24 0524  NA 141 139 137  K 3.3* 3.1* 3.9  CL 105 106 104  CO2 24 25 25   GLUCOSE 102* 79 73  BUN 19 18 16   CREATININE 0.88 0.79 0.81  CALCIUM 9.9 9.3 9.4  MG  --  1.7  --    GFR: Estimated Creatinine Clearance: 42.4 mL/min (by C-G formula based on SCr of 0.81 mg/dL). Liver Function Tests: Recent Labs  Lab 02/23/24 0537  AST 17  ALT 5  ALKPHOS 86  BILITOT 0.7  PROT 5.5*  ALBUMIN 3.1*   No results for input(s): LIPASE, AMYLASE in the last 168 hours. No results for input(s): AMMONIA in the last 168 hours. Coagulation Profile: No results for input(s): INR, PROTIME in the last 168 hours. Cardiac Enzymes: No results for input(s): CKTOTAL, CKMB, CKMBINDEX, TROPONINI in the last 168 hours. BNP (last 3 results) No results for input(s): PROBNP in the last 8760 hours. HbA1C: No results for input(s): HGBA1C in the last 72 hours. CBG: No results for input(s): GLUCAP in the last 168 hours. Lipid Profile: No results for input(s): CHOL, HDL, LDLCALC, TRIG, CHOLHDL, LDLDIRECT in the last 72 hours. Thyroid Function Tests: No results for input(s): TSH, T4TOTAL, FREET4, T3FREE, THYROIDAB in the last 72 hours. Anemia Panel: No results for input(s): VITAMINB12, FOLATE, FERRITIN, TIBC, IRON, RETICCTPCT in the last 72 hours. Sepsis Labs: No results for input(s): PROCALCITON, LATICACIDVEN in the last 168 hours.  Recent Results (from the past 240 hours)   Culture, blood (Routine x 2)     Status: None   Collection Time: 02/14/24  5:49 PM   Specimen: BLOOD  Result Value Ref Range Status   Specimen Description   Final    BLOOD BLOOD RIGHT ARM Performed at [facility], [address], [place], [place] [address]    Special Requests   Final    BOTTLES DRAWN AEROBIC AND ANAEROBIC Blood Culture adequate volume Performed at [facility], [address], [place], [place] [address]    Culture   Final    NO GROWTH 5 DAYS Performed at [facility], [address], [place], [place] [address]    Report Status 02/19/2024 FINAL  Final  MRSA Next Gen by PCR, Nasal     Status: None   Collection Time: 02/23/24  1:25 PM   Specimen: Nasal Mucosa; Nasal Swab  Result Value Ref Range Status   MRSA by PCR Next Gen NOT DETECTED NOT DETECTED Final    Comment: (NOTE) The GeneXpert MRSA Assay (FDA approved for NASAL  specimens only), is one component of a comprehensive MRSA colonization surveillance program. It is not intended to diagnose MRSA infection nor to guide or monitor treatment for MRSA infections. Test performance is not FDA approved in patients less than 56 years old. Performed at [facility] [place] [facility], [address] [place], [place] [address]          Radiology Studies: DG Knee Complete 4 Views Left Result Date: 02/22/2024 EXAM: 4 VIEW(S) XRAY OF THE LEFT KNEE 02/22/2024 04:51:00 PM COMPARISON: 10/15/2023 CLINICAL HISTORY: fall FINDINGS: BONES AND JOINTS: Interval left total knee arthroplasty is in place. Small effusion in the suprapatellar bursa. No acute fracture. No focal osseous lesion. No joint dislocation. SOFT TISSUES: Femoropopliteal arterial calcifications. The soft tissues are otherwise unremarkable. IMPRESSION: 1. Interval left total knee arthroplasty in expected position. Electronically signed by: Morgane Naveau MD 02/22/2024 05:02 PM EST RP Workstation: [id number]         Scheduled Meds:  divalproex  125 mg Oral QPM   enoxaparin  (LOVENOX ) injection  40 mg Subcutaneous Q24H   naphazoline-pheniramine  1 drop Both Eyes Daily   pantoprazole   40 mg Oral Daily   Continuous Infusions:  cefTRIAXone  (ROCEPHIN )  IV 1 g (02/24/24 0111)     LOS: 0 days   Time spent:  Elsie JAYSON Montclair, DO [facility]  If 7PM-7AM, please contact night-coverage [email or web address]  02/24/2024, 7:42 AM

## 2024-02-25 DIAGNOSIS — G9341 Metabolic encephalopathy: Secondary | ICD-10-CM | POA: Diagnosis not present

## 2024-02-25 DIAGNOSIS — E876 Hypokalemia: Secondary | ICD-10-CM | POA: Diagnosis not present

## 2024-02-25 DIAGNOSIS — N3000 Acute cystitis without hematuria: Secondary | ICD-10-CM | POA: Diagnosis not present

## 2024-02-25 DIAGNOSIS — W19XXXA Unspecified fall, initial encounter: Secondary | ICD-10-CM | POA: Diagnosis not present

## 2024-02-25 LAB — URINE CULTURE: Culture: >=100000 — AB

## 2024-02-25 MED ORDER — ASPIRIN 81 MG PO TBEC
81.0000 mg | DELAYED_RELEASE_TABLET | Freq: Every day | ORAL | Status: DC
  Administered 2024-02-25 – 2024-02-29 (×5): 81 mg via ORAL
  Filled 2024-02-25 (×6): qty 1

## 2024-02-25 NOTE — Plan of Care (Signed)
  Problem: Clinical Measurements: Goal: Respiratory complications will improve Outcome: Progressing Goal: Cardiovascular complication will be avoided Outcome: Progressing   Problem: Activity: Goal: Risk for activity intolerance will decrease Outcome: Progressing   Problem: Nutrition: Goal: Adequate nutrition will be maintained Outcome: Progressing   Problem: Elimination: Goal: Will not experience complications related to urinary retention Outcome: Progressing   Problem: Pain Managment: Goal: General experience of comfort will improve and/or be controlled Outcome: Progressing

## 2024-02-25 NOTE — Progress Notes (Signed)
 Mobility Specialist - Progress Note   02/25/24 1132  Mobility  Activity Pivoted/transferred from bed to chair  Level of Assistance Minimal assist, patient does 75% or more  Assistive Device Front wheel walker  Range of Motion/Exercises Active  Activity Response Tolerated well  Mobility visit 1 Mobility  Mobility Specialist Start Time (ACUTE ONLY) 1110  Mobility Specialist Stop Time (ACUTE ONLY) 1124  Mobility Specialist Time Calculation (min) (ACUTE ONLY) 14 min   Pt was found in bed and agreeable to mobilize. No complaints. At EOS was left on recliner chair with all needs met. Call bell in reach and RN notified. Chair alarm on.   Erminio Leos,  Mobility Specialist Can be reached via Secure Chat

## 2024-02-25 NOTE — Progress Notes (Signed)
 PROGRESS NOTE    Gina [name]  FMW:[id number] DOB: 10/05/1932 DOA: 02/22/2024 PCP: Teresa Thersia Jansky, MD   Brief Narrative:  Patient is a pleasant 89 year old female with history of hypertension CKD 3A left total knee replacement recently on 9/22 previously admitted to our facility late October for sepsis UTI SVT and encephalopathy.  She was admitted again on 11/6 for recurrent episodes of encephalopathy with left renal mass concerning for renal cell carcinoma recommendation for outpatient follow-up and discharged to SNF.  Patient was discharged from SNF 24 hours prior to presenting to our facility after what appears to be a collapse and fall without loss of consciousness.  There was no mechanical fall, as patient did not trip, however her legs gave out as her family was helping her out of the car.  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 - Recent left knee arthroplasty appears stable on imaging   Presumed UTI - Due to abnormal UA and symptoms of encephalopathy  - Follow culture, continue ceftriaxone  x 3 days -stop date 02/25/2024 - Consider prophylactic antibiotics moving forward if patient continues to have recurrent infections  Acute encephalopathy, resolved - Likely secondary to UTI as above, no syncope prior to hospitalization, patient and family deny any head strike during fall - Approaching baseline per daughter at bedside   Mild hypokalemia -replete as appropriate,  likely secondary to poor p.o. intake  Hypertension -recently discontinued for concern of orthostatics/hypotension CKD stage II-IIIa - Creatinine stable.   Recently discovered left renal mass of unspecified etiology - Recent CT at our facility shows left renal mass with potential thrombus extending into the left renal vein concerning for renal cell carcinoma -Urology recommending outpatient follow-up -reached out per request of daughter for further recommendations in regards to possible biopsy   Mood disorder, unspecified - continue Depakote GERD Continue PPI.  DVT prophylaxis: enoxaparin  (LOVENOX ) injection 40 mg Start: 02/23/24 1000 Code Status:   Code Status: Do not attempt resuscitation (DNR) PRE-ARREST INTERVENTIONS DESIRED Family Communication: At bedside  Status is: Observation  Dispo: The patient is from: Home              Anticipated d/c is to: To be determined              Anticipated d/c date is: To be determined              Patient currently is medically stable for discharge awaiting safe disposition at SNF  Consultants:  None  Procedures:  None  Antimicrobials:  Ceftriaxone   Subjective: No acute issues or events overnight -complaining of tailbone pain at site of pressure  Objective: Vitals:   02/24/24 0443 02/24/24 1244 02/24/24 1947 02/25/24 0648  BP: 136/63 (!) 113/50 (!) 115/48 (!) 115/46  Pulse: 67 71 65 61  Resp: 16 17 17 18   Temp: 98 F (36.7 C) 98 F (36.7 C) 98.3 F (36.8 C) 98.2 F (36.8 C)  TempSrc: Oral Oral Oral Oral  SpO2: 98% 96% 95% 97%  Weight:  Height:        Intake/Output Summary (Last 24 hours) at 02/25/2024 0724 Last data filed at 02/24/2024 1859 Gross per 24 hour  Intake 170 ml  Output 300 ml  Net -130 ml   Filed Weights   02/22/24 1634  Weight: 68 kg    Examination:  General:  Pleasantly resting in bed, No acute distress. HEENT:  Normocephalic atraumatic.  Sclerae nonicteric, noninjected.  Extraocular movements  intact bilaterally. Neck:  Without mass or deformity.  Trachea is midline. Lungs:  Clear to auscultate bilaterally without rhonchi, wheeze, or rales. Heart:  Regular rate and rhythm.  Without murmurs, rubs, or gallops. Abdomen:  Soft, nontender, nondistended.  Without guarding or rebound. Extremities: Without cyanosis, clubbing, edema, or obvious deformity. Skin:  Warm and dry; wounds noted POA Wound 12/31/23 1200 Pressure Injury Buttocks Mid Stage 2 -  Partial thickness loss of dermis presenting as a shallow open injury with a red, pink wound bed without slough. (Active)     Wound 02/23/24 1252 Pressure Injury Sacrum Mid Stage 4 - Full thickness tissue loss with exposed bone, tendon or muscle. (Active)    Data Reviewed: I have personally reviewed following labs and imaging studies  CBC: Recent Labs  Lab 02/22/24 2012  WBC 9.3  NEUTROABS 7.3  HGB 13.1  HCT 38.5  MCV 99.0  PLT 260   Basic Metabolic Panel: Recent Labs  Lab 02/22/24 2012 02/23/24 0537 02/24/24 0524  NA 141 139 137  K 3.3* 3.1* 3.9  CL 105 106 104  CO2 24 25 25   GLUCOSE 102* 79 73  BUN 19 18 16   CREATININE 0.88 0.79 0.81  CALCIUM 9.9 9.3 9.4  MG  --  1.7  --    GFR: Estimated Creatinine Clearance: 42.4 mL/min (by C-G formula based on SCr of 0.81 mg/dL). Liver Function Tests: Recent Labs  Lab 02/23/24 0537  AST 17  ALT 5  ALKPHOS 86  BILITOT 0.7  PROT 5.5*  ALBUMIN 3.1*   No results for input(s): LIPASE, AMYLASE in the last 168 hours. No results for input(s): AMMONIA in the last 168 hours. Coagulation Profile: No results for input(s): INR, PROTIME in the last 168 hours. Cardiac Enzymes: No results for input(s): CKTOTAL, CKMB, CKMBINDEX, TROPONINI in the last 168 hours. BNP (last 3 results) No results for input(s): PROBNP in the last 8760 hours. HbA1C: No results for input(s): HGBA1C in the last 72 hours. CBG: No results for input(s): GLUCAP in the last 168  hours. Lipid Profile: No results for input(s): CHOL, HDL, LDLCALC, TRIG, CHOLHDL, LDLDIRECT in the last 72 hours. Thyroid Function Tests: No results for input(s): TSH, T4TOTAL, FREET4, T3FREE, THYROIDAB in the last 72 hours. Anemia Panel: No results for input(s): VITAMINB12, FOLATE, FERRITIN, TIBC, IRON, RETICCTPCT in the last 72 hours. Sepsis Labs: No results for input(s): PROCALCITON, LATICACIDVEN in the last 168 hours.  Recent Results (from the past 240 hours)  Urine Culture     Status: Abnormal (Preliminary result)   Collection Time: 02/22/24 11:33 PM   Specimen: Urine, Random  Result Value Ref Range Status   Specimen Description   Final    URINE, RANDOM Performed at [facility], [address] [place], [place] [address]    Special Requests   Final    NONE Reflexed from [id number] Performed at [facility], [address] [place], [place] [address]    Culture (A)  Final    >=100,000 COLONIES/mL GRAM NEGATIVE RODS IDENTIFICATION AND  SUSCEPTIBILITIES TO FOLLOW Performed at [facility], [address], [place], [place] [address]    Report Status PENDING  Incomplete  MRSA Next Gen by PCR, Nasal     Status: None   Collection Time: 02/23/24  1:25 PM   Specimen: Nasal Mucosa; Nasal Swab  Result Value Ref Range Status   MRSA by PCR Next Gen NOT DETECTED NOT DETECTED Final    Comment: (NOTE) The GeneXpert MRSA Assay (FDA approved for NASAL specimens only), is one component of a comprehensive MRSA colonization surveillance program. It is not intended to diagnose MRSA infection nor to guide or monitor treatment for MRSA infections. Test performance is not FDA approved in patients less than 93 years old. Performed at [facility], [address] [place], [place] [address]          Radiology Studies: No results found.       Scheduled Meds:  divalproex  125 mg Oral QPM    enoxaparin  (LOVENOX ) injection  40 mg Subcutaneous Q24H   naphazoline-pheniramine  1 drop Both Eyes Daily   pantoprazole   40 mg Oral Daily   Continuous Infusions:  cefTRIAXone  (ROCEPHIN )  IV 1 g (02/24/24 2220)     LOS: 0 days   Time spent:  Elsie JAYSON Montclair, DO [facility]  If 7PM-7AM, please contact night-coverage [email or web address]  02/25/2024, 7:24 AM

## 2024-02-25 NOTE — TOC Progression Note (Signed)
 Transition of Care [facility]) - Progression Note    Patient Details  Name: Gina [name] MRN: [id number] Date of Birth: 14-Nov-1932  Transition of Care [facility]) CM/SW Contact  Ryszard Socarras, Nathanel, RN Phone Number: 02/25/2024, 9:53 AM  Clinical Narrative: Spoke to janet(dtr) informed of bed choice-[facility] only one accepted. [name] still prefers [facility] to contact them since they have declined. [name] will review choices-also informed of HHC(intermittent) proviate duty(out of pocket cost for aide service more often that what insurance covers under [facility] beneift-[name] voiced understanding. Await choice.Informed attending of [name] wanting to discuss tx plan.   Service Provider Request Status Stars Address Phone  [facility]  Accepted 3 [address], [place] [address] [phone]  [facility] Preferred SNF  Considering 2    Need bed availability -- [address], [place] [address] [phone]  [facility] [place] [place] Preferred SNF  Considering 4    Need additional clinical information -- [address], [place] [address] [phone]  [facility]  Considering 2    Need bed availability -- [address], [place] [address] [phone]  [facility] SN[facility]  Considering 1    Need bed availability -- [address], [place] [address] [phone]  [facility] [place] [facility] Preferred SNF  Pending - Request Sent 1 [address] [place] [address] [phone]  [facility] REHABILITATION, [facility] Preferred SNF  Pending - Request Sent 4 [address] [place] [address] [phone]  [facility] Preferred SNF  Pending - Request Sent 3 [address]  [place] [address] [phone]  [facility]  Pending - Request Sent 4 [address] [place] [address] [phone]  [facility]  Pending -  Request Sent [address], [place] [address] [phone]  [facility]  Pending - Request Sent 3 [address] [place] [address] [phone]  [facility] Preferred SNF  Declined 5 [address] [place] [address] [phone]  [facility] [place] Preferred SNF  Declined 5    No bed availablity -- [address], [place] [address] [phone]  [facility] SNF  Declined 2    Out of network -- [address] [place], [place] [address] [phone]     Expected Discharge Plan: Skilled Nursing Facility Barriers to Discharge: Continued Medical Work up               Expected Discharge Plan and Services In-house Referral: NA Discharge Planning Services: CM Consult Post Acute Care Choice: Skilled Nursing Facility Living arrangements for the past 2 months: Single Family Home                 DME Arranged: N/A DME Agency: NA       HH Arranged: NA HH Agency: NA         Social Drivers of Health (SDOH) Interventions SDOH Screenings   Food Insecurity: No Food Insecurity (02/23/2024)  Housing: Low Risk  (02/23/2024)  Transportation Needs: No Transportation Needs (02/23/2024)  Utilities: Not At Risk (02/23/2024)  Financial Resource Strain: Low Risk  (07/29/2019)   Received from [facility] visits prior to 06/10/2022.  Physical Activity: Sufficiently Active (07/29/2019)   Received from [facility] visits prior to 06/10/2022.  Social Connections: Moderately Integrated (02/23/2024)  Stress: No Stress Concern Present (07/29/2019)   Received from [facility] visits prior to 06/10/2022.  Tobacco Use: Low Risk  (02/22/2024)    Readmission Risk Interventions     No data to display

## 2024-02-25 NOTE — Plan of Care (Signed)
  Problem: Education: Goal: Knowledge of General Education information will improve Description: Including pain rating scale, medication(s)/side effects and non-pharmacologic comfort measures Outcome: Progressing   Problem: Health Behavior/Discharge Planning: Goal: Ability to manage health-related needs will improve Outcome: Progressing   Problem: Clinical Measurements: Goal: Diagnostic test results will improve Outcome: Progressing Goal: Cardiovascular complication will be avoided Outcome: Progressing   Problem: Activity: Goal: Risk for activity intolerance will decrease Outcome: Progressing   Problem: Elimination: Goal: Will not experience complications related to urinary retention Outcome: Progressing   Problem: Pain Managment: Goal: General experience of comfort will improve and/or be controlled Outcome: Progressing

## 2024-02-26 DIAGNOSIS — N3000 Acute cystitis without hematuria: Secondary | ICD-10-CM | POA: Diagnosis not present

## 2024-02-26 DIAGNOSIS — M25562 Pain in left knee: Secondary | ICD-10-CM | POA: Diagnosis not present

## 2024-02-26 DIAGNOSIS — W19XXXA Unspecified fall, initial encounter: Secondary | ICD-10-CM | POA: Diagnosis not present

## 2024-02-26 DIAGNOSIS — G9341 Metabolic encephalopathy: Secondary | ICD-10-CM | POA: Diagnosis not present

## 2024-02-26 NOTE — TOC Progression Note (Signed)
 Transition of Care [facility]) - Progression Note    Patient Details  Name: Gina [name] MRN: [id number] Date of Birth: 04/29/1932  [facility] CM/SW Contact  Makai Dumond, Nathanel, RN Phone Number: 02/26/2024, 8:56 AM  Clinical Narrative:Kambri(dtr) is also aware that [facility] accepted also.       Expected Discharge Plan: Skilled Nursing Facility Barriers to Discharge: Continued Medical Work up               Expected Discharge Plan and Services In-house Referral: NA Discharge Planning Services: CM Consult Post Acute Care Choice: Skilled Nursing Facility Living arrangements for the past 2 months: Single Family Home                 DME Arranged: N/A DME Agency: NA       HH Arranged: NA HH Agency: NA         Social Drivers of Health (SDOH) Interventions SDOH Screenings   Food Insecurity: No Food Insecurity (02/23/2024)  Housing: Low Risk  (02/23/2024)  Transportation Needs: No Transportation Needs (02/23/2024)  Utilities: Not At Risk (02/23/2024)  Financial Resource Strain: Low Risk  (07/29/2019)   [facility] visits prior to 06/10/2022.  Physical Activity: Sufficiently Active (07/29/2019)   [facility] from [facility] visits prior to 06/10/2022.  Social Connections: Moderately Integrated (02/23/2024)  Stress: No Stress Concern Present (07/29/2019)   Received from [facility] visits prior to 06/10/2022.  Tobacco Use: Low Risk  (02/22/2024)    Readmission Risk Interventions     No data to display

## 2024-02-26 NOTE — Progress Notes (Signed)
 Physical Therapy Treatment Patient Details Name: Gina [name] MRN: [id number] DOB: [birth year] Today's Date: 02/26/2024   History of Present Illness Gina [name] is a 89 y.o. female presened to the ED for evaluation of left knee pain after a fall on 02/22/24. Found to have UTI and acute metabolic encephalopathy. Pt had recent L TKA (12/31/23), imaging after fall negative for acute injury.  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  Recently admitted to the hospital 10/21-10/25 for acute metabolic encephalopathy/delirium secondary to UTI/severe sepsis and SVT.  She was started on metoprolol.  Admitted again 11/6-11/7 for acute metabolic encephalopathy, SIRS, and left renal mass.    PT Comments  Pt with gradual progress but does fatigue easy and still with c/o L knee and lower extremity pain.  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  Encouraged towel roll under ankle and quad sets as able. Does have some redness proximal to L knee - daughter expressed concern and would like ortho to see pt - notified hospitalist. Cont POC.  Patient will benefit from continued inpatient follow up therapy, <3 hours/day a d/c.     If plan is discharge home, recommend the following: Assistance with cooking/housework;Assist for transportation;Help with stairs or ramp for entrance;A lot of help with walking and/or transfers;A lot of help with bathing/dressing/bathroom   Can travel by private vehicle     Yes  Equipment Recommendations  Wheelchair cushion (measurements PT);Wheelchair (measurements PT)    Recommendations for Other Services       Precautions / Restrictions Precautions Precautions: Fall     Mobility  Bed Mobility Overal bed mobility: Needs Assistance Bed Mobility: Supine to Sit, Sit to Supine     Supine to sit: Min assist Sit to supine: Min assist   General bed mobility comments: increased time  with cues and encouragement    Transfers Overall transfer level: Needs assistance Equipment used: Rolling walker (2 wheels) Transfers: Sit to/from Stand Sit to Stand: Min assist           General transfer comment: STS x 2; mod cues and encouragement    Ambulation/Gait Ambulation/Gait assistance: Min assist Gait Distance (Feet): 8 Feet (8'x2) Assistive device: Rolling walker (2 wheels) Gait Pattern/deviations: Step-to pattern, Decreased weight shift to left, Trunk flexed Gait velocity: decreased     General Gait Details: Min cues for RW , min A balance, max encouragement, when encouraged to try more pt reports well when you get my age you'll understand, you just don't know.   Stairs             Wheelchair Mobility     Tilt Bed    Modified Rankin (Stroke Patients Only)       Balance Overall balance assessment: Needs assistance Sitting-balance support: No upper extremity supported, Feet supported Sitting balance-Leahy Scale: Good     Standing balance support: Bilateral upper extremity supported, Reliant on assistive device for balance Standing balance-Leahy Scale: Poor Standing balance comment: RW and min A static stand                            Communication Communication Factors Affecting Communication: Hearing impaired  Cognition Arousal: Alert Behavior During Therapy: WFL for tasks assessed/performed   PT - Cognitive impairments: History of cognitive impairments  Cueing    Exercises Total Joint Exercises Ankle Circles/Pumps: AROM, Both, 10 reps, Supine Quad Sets: AROM, Left, 10 reps, Supine Long Arc Quad: AROM, Left, 10 reps, Seated Knee Flexion: AROM, Left, 10 reps, Seated Other Exercises Other Exercises: L knee lacking terminal knee ext.  Pt painful wtih stretches.  Did tolerate quad sets with therapist supporting at ankle but not tolerating over pressure.  Encouraged to rest with leg  straight. Did place towel roll under foot , when tried to elevate further pt removing.    General Comments General comments (skin integrity, edema, etc.): Pt with some redness just proximal to L knee - daughter concerned that is a larger area and due to pain.  Area is not bright red, more red/purple like ecchymosis. She would like ortho to see pt since had recent TKA.  Notified Dr. Lue of family request.      Pertinent Vitals/Pain Pain Assessment Pain Assessment: Faces Faces Pain Scale: Hurts little more Pain Location: L knee and lower anterior leg Pain Descriptors / Indicators: Aching, Sore, Guarding, Grimacing Pain Intervention(s): Limited activity within patient's tolerance, Monitored during session, Repositioned    Home Living                          Prior Function            PT Goals (current goals can now be found in the care plan section) Progress towards PT goals: Progressing toward goals    Frequency    Min 2X/week      PT Plan      Co-evaluation              AM-PAC PT 6 Clicks Mobility   Outcome Measure  Help needed turning from your back to your side while in a flat bed without using bedrails?: A Lot Help needed moving from lying on your back to sitting on the side of a flat bed without using bedrails?: A Lot Help needed moving to and from a bed to a chair (including a wheelchair)?: A Lot Help needed standing up from a chair using your arms (e.g., wheelchair or bedside chair)?: A Lot Help needed to walk in hospital room?: Total (<20') Help needed climbing 3-5 steps with a railing? : Total 6 Click Score: 10    End of Session Equipment Utilized During Treatment: Gait belt Activity Tolerance: Patient limited by pain;Patient limited by fatigue (self limiting) Patient left: in bed;with call bell/phone within reach;with bed alarm set;with family/visitor present Nurse Communication: Mobility status PT Visit Diagnosis: Unsteadiness on  feet (R26.81);Other abnormalities of gait and mobility (R26.89);Muscle weakness (generalized) (M62.81)     Time: 8450-8381 PT Time Calculation (min) (ACUTE ONLY): 29 min  Charges:    $Gait Training: 8-22 mins $Therapeutic Exercise: 8-22 mins PT General Charges $$ ACUTE PT VISIT: 1 Visit                     Benjiman, PT Acute Rehab [facility] [phone]    Benjiman VEAR [place] 02/26/2024, 4:31 PM

## 2024-02-26 NOTE — Discharge Summary (Signed)
 Physician Discharge Summary  Gina [name] FMW:[id number] DOB: [birth year] DOA: 02/22/2024  PCP: Teresa Thersia Jansky, MD  Admit date: 02/22/2024 Discharge date: 02/26/2024  Admitted From: Home Disposition: SNF  Recommendations for Outpatient Follow-up:  Follow up with PCP in 1-2 weeks  Discharge Condition: Stable CODE STATUS: DNR Diet recommendation:    Brief/Interim Summary: Patient is a pleasant 89 year old female with history of hypertension CKD 3A left total knee replacement recently on 9/22 previously admitted to our facility late October for sepsis UTI SVT and encephalopathy.  She was admitted again on 11/6 for recurrent episodes of encephalopathy with left renal mass concerning for renal cell carcinoma recommendation for outpatient follow-up and discharged to SNF.  Patient was discharged from SNF 24 hours prior to presenting to our facility after what appears to be a collapse and fall without loss of consciousness.  There was no mechanical fall, as patient did not trip, however her legs gave out as her family was helping her out of the car.  Patient admitted as above after acute fall, considered mechanical but notedly patient has signs and symptoms of UTI, has completed treatment at this time and is otherwise medically stable for discharge.  Given patient's ongoing ambulatory dysfunction she is planned to return back to her skilled nursing facility for ongoing treatment.  Currently awaiting family and patient to choose facility, unfortunately only 2 options were presented to the family due to patient's insurance.  Patient remains medically stable at this time.  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 - Recent left knee arthroplasty appears stable on imaging -pain improving with supportive care   Presumed UTI, POA - Due to abnormal UA and symptoms of encephalopathy  - Follow culture, completed ceftriaxone , symptoms resolved - Consider prophylactic antibiotics moving forward if patient continues to have recurrent infections   Acute encephalopathy, resolved - Likely secondary to UTI as above, no syncope prior to hospitalization, patient and family deny any head strike during fall - Essentially at baseline   Mild hypokalemia -replete as appropriate, likely secondary to poor p.o. intake  Hypertension -recently discontinued for concern of orthostatics/hypotension CKD stage II-IIIa - Creatinine stable.   Recently discovered left renal mass of unspecified etiology - Recent CT at our facility shows left renal mass with potential thrombus extending into the left renal vein concerning for renal cell carcinoma -Urology recommending outpatient follow-up -reached out per request of daughter for further recommendations in regards to possible biopsy which urology indicated was not something they typically do for these as they require surgical removal and biopsy is done postoperatively.  It is unclear that patient is a surgical candidate for this case given her age and comorbid conditions.   Mood disorder, unspecified - continue Depakote GERD Continue PPI.    Discharge Instructions  Discharge Instructions     Call MD for:  difficulty breathing, headache or visual disturbances   Complete by: As directed    Call MD for:  extreme fatigue   Complete by: As directed    Call MD for:  hives   Complete by: As directed    Call MD for:  persistant dizziness or  light-headedness   Complete by: As directed    Call MD for:  persistant nausea and vomiting   Complete by: As directed    Call MD for:  severe uncontrolled pain   Complete by: As directed    Call MD for:  temperature >100.4   Complete by: As directed    Diet - low sodium heart healthy   Complete by: As directed    Increase activity slowly   Complete by: As directed    No wound care   Complete by: As directed       Allergies as of 02/26/2024       Reactions   Codeine Other (See Comments)   Insomnia   Strawberry Extract Hives        Medication List     TAKE these medications    acetaminophen  500 MG tablet Commonly known as: TYLENOL  Take 1,000 mg by mouth every 8 (eight) hours as needed for moderate pain (pain score 4-6).   aspirin  EC 81 MG tablet Take 81 mg by mouth daily.   carboxymethylcellulose 0.5 % Soln Commonly known as: REFRESH PLUS Place 1 drop into both eyes daily.   divalproex 125 MG DR tablet Commonly known as: DEPAKOTE Take 125 mg by mouth every evening.   omeprazole 20 MG capsule Commonly known as: PRILOSEC Take 20 mg by mouth daily.   PRO-STAT PO Take 30 mLs by mouth daily.   traMADol  50 MG tablet Commonly known as: ULTRAM  Take 1-2 tablets (50-100 mg total) by mouth every 8 (eight) hours as needed for moderate pain (pain score 4-6).        Allergies  Allergen Reactions   Codeine Other (See Comments)    Insomnia    Strawberry Extract Hives    Consultations: None, discussed case with urology as per patient request as above   Procedures/Studies: DG Knee Complete 4 Views Left Result Date: 02/22/2024 EXAM: 4 VIEW(S) XRAY OF THE LEFT KNEE 02/22/2024 04:51:00 PM COMPARISON: 10/15/2023 CLINICAL HISTORY: fall FINDINGS: BONES AND JOINTS: Interval left total knee arthroplasty is in place. Small effusion in the suprapatellar bursa. No acute fracture. No focal osseous lesion. No joint dislocation. SOFT TISSUES: Femoropopliteal arterial  calcifications. The soft tissues are otherwise unremarkable. IMPRESSION: 1. Interval left total knee arthroplasty in expected position. Electronically signed by: Morgane Naveau MD 02/22/2024 05:02 PM EST RP Workstation: [id number]   CT Angio Chest PE W and/or Wo Contrast Result Date: 02/14/2024 EXAM: CTA CHEST PE WITHOUT AND WITH CONTRAST CT ABDOMEN AND PELVIS WITH CONTRAST 02/14/2024 09:00:45 PM TECHNIQUE: CTA of the chest was performed after the administration of 100 mL of iohexol  (OMNIPAQUE ) 350 MG/ML injection. Multiplanar reformatted images are provided for review. MIP images are provided for review. CT of the abdomen and pelvis was performed with the administration of intravenous contrast. Automated exposure control, iterative reconstruction, and/or weight based adjustment of the mA/kV was utilized to reduce the radiation dose to as low as reasonably achievable. COMPARISON: Comparison with same day chest x-ray and CT chest 11/21/2021. CLINICAL HISTORY: Pulmonary embolism (PE) suspected, high prob. Acute um localized abdominal pain. Cough. FINDINGS: CHEST: PULMONARY ARTERIES: Pulmonary arteries are adequately opacified for evaluation. No intraluminal filling defect to suggest pulmonary embolism. Main pulmonary artery is normal in caliber. MEDIASTINUM: No mediastinal lymphadenopathy. The heart and pericardium demonstrate no acute abnormality. Coronary artery and aortic atherosclerotic calcification. There is no acute abnormality  of the thoracic aorta. LUNGS AND PLEURA: Bronchial wall thickening with mucus plugging and bronchiectasis/bronchiolectasis in the lower lobes. No focal consolidation or pulmonary edema. No pleural effusion or pneumothorax. SOFT TISSUES AND BONES: No acute bone or soft tissue abnormality. ABDOMEN AND PELVIS: LIVER: The liver is unremarkable. GALLBLADDER AND BILE DUCTS: Distended gallbladder. No biliary ductal dilatation. SPLEEN: Spleen demonstrates no acute abnormality. PANCREAS:  Multicystic lesion in the pancreatic body with mural calcification measures 4.1 x 3.9 cm. No pancreatic ductal dilation. ADRENAL GLANDS: Stable left adrenal adenoma. KIDNEYS, URETERS AND BLADDER: Punctate nonobstructing bilateral nephrolithiasis. Heterogeneously enhancing mass in the lower pole of the left kidney measures 4.7 cm. Tumor thrombus extends into an inferior branch of the left renal vein. No hydronephrosis. No perinephric or periureteral stranding. Urinary bladder is unremarkable. GI AND BOWEL: Diverticulosis without diverticulitis. Stomach and duodenal sweep demonstrate no acute abnormality. There is no bowel obstruction. No abnormal bowel wall thickening or distension. REPRODUCTIVE: 5.7 cm fibroid in the uterus. PERITONEUM AND RETROPERITONEUM: No ascites or free air. LYMPH NODES: No lymphadenopathy. BONES AND SOFT TISSUES: No acute abnormality of the visualized bones. No focal soft tissue abnormality. IMPRESSION: 1. No pulmonary embolism. 2. Findings consistent with chronic bronchitis. 3. 4.7 cm enhancing mass in the lower pole of the left kidney highly suspicious for renal cell carcinoma. There is tumor thrombus extending into an inferior branch of the left renal vein; recommend urology consultation. 4. 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 Follow up as appropriate given age and comorbidities. Electronically signed by: Norman Gatlin MD 02/14/2024 09:22 PM EST RP Workstation: [id number]   CT ABDOMEN PELVIS W CONTRAST Result Date: 02/14/2024 EXAM: CTA CHEST PE WITHOUT AND WITH CONTRAST CT ABDOMEN AND PELVIS WITH CONTRAST 02/14/2024 09:00:45 PM TECHNIQUE: CTA of the chest was performed after the administration of 100 mL of iohexol  (OMNIPAQUE ) 350 MG/ML injection. Multiplanar reformatted images are provided for review. MIP images are provided for review. CT of the abdomen and pelvis was performed with the  administration of intravenous contrast. Automated exposure control, iterative reconstruction, and/or weight based adjustment of the mA/kV was utilized to reduce the radiation dose to as low as reasonably achievable. COMPARISON: Comparison with same day chest x-ray and CT chest 11/21/2021. CLINICAL HISTORY: Pulmonary embolism (PE) suspected, high prob. Acute um localized abdominal pain. Cough. FINDINGS: CHEST: PULMONARY ARTERIES: Pulmonary arteries are adequately opacified for evaluation. No intraluminal filling defect to suggest pulmonary embolism. Main pulmonary artery is normal in caliber. MEDIASTINUM: No mediastinal lymphadenopathy. The heart and pericardium demonstrate no acute abnormality. Coronary artery and aortic atherosclerotic calcification. There is no acute abnormality of the thoracic aorta. LUNGS AND PLEURA: Bronchial wall thickening with mucus plugging and bronchiectasis/bronchiolectasis in the lower lobes. No focal consolidation or pulmonary edema. No pleural effusion or pneumothorax. SOFT TISSUES AND BONES: No acute bone or soft tissue abnormality. ABDOMEN AND PELVIS: LIVER: The liver is unremarkable. GALLBLADDER AND BILE DUCTS: Distended gallbladder. No biliary ductal dilatation. SPLEEN: Spleen demonstrates no acute abnormality. 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 No pancreatic ductal dilation. ADRENAL GLANDS: Stable left adrenal adenoma. KIDNEYS, URETERS AND BLADDER: Punctate nonobstructing bilateral nephrolithiasis. Heterogeneously enhancing mass in the lower pole of the left kidney measures 4.7 cm. Tumor thrombus extends into an inferior branch of the left renal vein. No hydronephrosis. No perinephric or periureteral stranding. Urinary bladder is unremarkable. GI AND BOWEL: Diverticulosis without diverticulitis. Stomach and  duodenal sweep demonstrate no acute abnormality. There is no bowel obstruction. No abnormal bowel wall  thickening or distension. REPRODUCTIVE: 5.7 cm fibroid in the uterus. PERITONEUM AND RETROPERITONEUM: No ascites or free air. LYMPH NODES: No lymphadenopathy. BONES AND SOFT TISSUES: No acute abnormality of the visualized bones. No focal soft tissue abnormality. IMPRESSION: 1. No pulmonary embolism. 2. Findings consistent with chronic bronchitis. 3. 4.7 cm enhancing mass in the lower pole of the left kidney highly suspicious for renal cell carcinoma. There is tumor thrombus extending into an inferior branch of the left renal vein; recommend urology consultation. 4. 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 Follow up as appropriate given age and comorbidities. Electronically signed by: Norman Gatlin MD 02/14/2024 09:22 PM EST RP Workstation: [id number]   DG Chest Portable 1 View Result Date: 02/14/2024 CLINICAL DATA:  Cough. EXAM: PORTABLE CHEST 1 VIEW COMPARISON:  Chest recommended 04/27/2022. FINDINGS: Shallow inspiration. No focal consolidation, pleural effusion or pneumothorax. The cardiac silhouette is within limits. Atherosclerotic calcification of the aorta. No acute osseous pathology. Partially visualized right shoulder arthroplasty. IMPRESSION: No active disease. Electronically Signed   By: Vanetta Chou M.D.   On: 02/14/2024 18:03     Subjective: No acute issues or events overnight denies nausea vomit diarrhea constipation headache fevers chills or chest pain   Discharge Exam: Vitals:   02/26/24 0420 02/26/24 1330  BP: (!) 127/50 (!) 107/53  Pulse: 73 82  Resp: 14 18  Temp: 98 F (36.7 C) 98.1 F (36.7 C)  SpO2: 98% 96%   Vitals:   02/25/24 1304 02/25/24 2043 02/26/24 0420 02/26/24 1330  BP: (!) 113/50 131/63 (!) 127/50 (!) 107/53  Pulse: 79 77 73 82  Resp: 16 16 14 18   Temp: 98.2 F (36.8 C) 98.5 F (36.9 C) 98 F (36.7 C) 98.1 F (36.7 C)  TempSrc: Oral Oral Oral Oral  SpO2: 97% 93% 98%  96%  Weight:      Height:        General: Pt is alert, awake, not in acute distress Cardiovascular: RRR, S1/S2 +, no rubs, no gallops Respiratory: CTA bilaterally, no wheezing, no rhonchi Abdominal: Soft, NT, ND, bowel sounds + Extremities: no edema, no cyanosis    The results of significant diagnostics from this hospitalization (including imaging, microbiology, ancillary and laboratory) are listed below for reference.     Microbiology: Recent Results (from the past 240 hours)  Urine Culture     Status: Abnormal   Collection Time: 02/22/24 11:33 PM   Specimen: Urine, Random  Result Value Ref Range Status   Specimen Description   Final    URINE, RANDOM Performed at [facility], [address], [place], [place] [address]    Special Requests   Final    NONE Reflexed from [id number] Performed at [facility], [address] [place], [place] [address]    Culture >=100,000 COLONIES/mL CITROBACTER SPECIES (A)  Final   Report Status 02/25/2024 FINAL  Final   Organism ID, Bacteria CITROBACTER SPECIES (A)  Final      Susceptibility   Citrobacter species - MIC*    CEFEPIME <=0.12 SENSITIVE Sensitive     ERTAPENEM <=0.12 SENSITIVE Sensitive     CEFTRIAXONE  <=0.25 SENSITIVE Sensitive     CIPROFLOXACIN 0.12 SENSITIVE Sensitive     GENTAMICIN <=1 SENSITIVE Sensitive     NITROFURANTOIN <=16 SENSITIVE Sensitive     TRIMETH/SULFA <=20 SENSITIVE Sensitive  PIP/TAZO Value in next row Sensitive      <=4 SENSITIVEThis is a modified FDA-approved test that has been validated and its performance characteristics determined by the reporting laboratory.  This laboratory is certified under the Clinical Laboratory Improvement Amendments CLIA as qualified to perform high complexity clinical laboratory testing.    MEROPENEM Value in next row Sensitive      <=4 SENSITIVEThis is a modified FDA-approved test that has been validated and its performance characteristics  determined by the reporting laboratory.  This laboratory is certified under the Clinical Laboratory Improvement Amendments CLIA as qualified to perform high complexity clinical laboratory testing.    * >=100,000 COLONIES/mL CITROBACTER SPECIES  MRSA Next Gen by PCR, Nasal     Status: None   Collection Time: 02/23/24  1:25 PM   Specimen: Nasal Mucosa; Nasal Swab  Result Value Ref Range Status   MRSA by PCR Next Gen NOT DETECTED NOT DETECTED Final    Comment: (NOTE) The GeneXpert MRSA Assay (FDA approved for NASAL specimens only), is one component of a comprehensive MRSA colonization surveillance program. It is not intended to diagnose MRSA infection nor to guide or monitor treatment for MRSA infections. Test performance is not FDA approved in patients less than 56 years old. Performed at [facility], [address] [place], [place] [address]      Labs: BNP (last 3 results) No results for input(s): BNP in the last 8760 hours. Basic Metabolic Panel: Recent Labs  Lab 02/22/24 2012 02/23/24 0537 02/24/24 0524  NA 141 139 137  K 3.3* 3.1* 3.9  CL 105 106 104  CO2 24 25 25   GLUCOSE 102* 79 73  BUN 19 18 16   CREATININE 0.88 0.79 0.81  CALCIUM 9.9 9.3 9.4  MG  --  1.7  --    Liver Function Tests: Recent Labs  Lab 02/23/24 0537  AST 17  ALT 5  ALKPHOS 86  BILITOT 0.7  PROT 5.5*  ALBUMIN 3.1*   No results for input(s): LIPASE, AMYLASE in the last 168 hours. No results for input(s): AMMONIA in the last 168 hours. CBC: Recent Labs  Lab 02/22/24 2012  WBC 9.3  NEUTROABS 7.3  HGB 13.1  HCT 38.5  MCV 99.0  PLT 260   Cardiac Enzymes: No results for input(s): CKTOTAL, CKMB, CKMBINDEX, TROPONINI in the last 168 hours. BNP: Invalid input(s): POCBNP CBG: No results for input(s): GLUCAP in the last 168 hours. D-Dimer No results for input(s): DDIMER in the last 72 hours. Hgb A1c No results for input(s): HGBA1C in the last 72  hours. Lipid Profile No results for input(s): CHOL, HDL, LDLCALC, TRIG, CHOLHDL, LDLDIRECT in the last 72 hours. Thyroid function studies No results for input(s): TSH, T4TOTAL, T3FREE, THYROIDAB in the last 72 hours.  Invalid input(s): FREET3 Anemia work up No results for input(s): VITAMINB12, FOLATE, FERRITIN, TIBC, IRON, RETICCTPCT in the last 72 hours. Urinalysis    Component Value Date/Time   COLORURINE AMBER (A) 02/22/2024 2333   APPEARANCEUR CLOUDY (A) 02/22/2024 2333   LABSPEC 1.017 02/22/2024 2333   PHURINE 5.0 02/22/2024 2333   GLUCOSEU NEGATIVE 02/22/2024 2333   HGBUR NEGATIVE 02/22/2024 2333   BILIRUBINUR NEGATIVE 02/22/2024 2333   KETONESUR NEGATIVE 02/22/2024 2333   PROTEINUR NEGATIVE 02/22/2024 2333   UROBILINOGEN 0.2 07/11/2010 1400   NITRITE POSITIVE (A) 02/22/2024 2333   LEUKOCYTESUR LARGE (A) 02/22/2024 2333   Sepsis Labs Recent Labs  Lab 02/22/24 2012  WBC 9.3   Microbiology Recent  Results (from the past 240 hours)  Urine Culture     Status: Abnormal   Collection Time: 02/22/24 11:33 PM   Specimen: Urine, Random  Result Value Ref Range Status   Specimen Description   Final    URINE, RANDOM Performed at [facility], [address] [place], [place] [address]    Special Requests   Final    NONE Reflexed from [id number] Performed at [facility], [address] [place], [place] [address]    Culture >=100,000 COLONIES/mL CITROBACTER SPECIES (A)  Final   Report Status 02/25/2024 FINAL  Final   Organism ID, Bacteria CITROBACTER SPECIES (A)  Final      Susceptibility   Citrobacter species - MIC*    CEFEPIME <=0.12 SENSITIVE Sensitive     ERTAPENEM <=0.12 SENSITIVE Sensitive     CEFTRIAXONE  <=0.25 SENSITIVE Sensitive     CIPROFLOXACIN 0.12 SENSITIVE Sensitive     GENTAMICIN <=1 SENSITIVE Sensitive     NITROFURANTOIN <=16 SENSITIVE Sensitive     TRIMETH/SULFA <=20 SENSITIVE  Sensitive     PIP/TAZO Value in next row Sensitive      <=4 SENSITIVEThis is a modified FDA-approved test that has been validated and its performance characteristics determined by the reporting laboratory.  This laboratory is certified under the Clinical Laboratory Improvement Amendments CLIA as qualified to perform high complexity clinical laboratory testing.    MEROPENEM Value in next row Sensitive      <=4 SENSITIVEThis is a modified FDA-approved test that has been validated and its performance characteristics determined by the reporting laboratory.  This laboratory is certified under the Clinical Laboratory Improvement Amendments CLIA as qualified to perform high complexity clinical laboratory testing.    * >=100,000 COLONIES/mL CITROBACTER SPECIES  MRSA Next Gen by PCR, Nasal     Status: None   Collection Time: 02/23/24  1:25 PM   Specimen: Nasal Mucosa; Nasal Swab  Result Value Ref Range Status   MRSA by PCR Next Gen NOT DETECTED NOT DETECTED Final    Comment: (NOTE) The GeneXpert MRSA Assay (FDA approved for NASAL specimens only), is one component of a comprehensive MRSA colonization surveillance program. It is not intended to diagnose MRSA infection nor to guide or monitor treatment for MRSA infections. Test performance is not FDA approved in patients less than 75 years old. Performed at [facility], [address] [place], [place] [address]      Time coordinating discharge: Over 30 minutes  SIGNED:   Elsie JAYSON [place], DO [facility] 02/26/2024, 2:46 PM Pager   If 7PM-7AM, please contact night-coverage [email or web address]

## 2024-02-26 NOTE — TOC Progression Note (Signed)
 Transition of Care [facility]) - Progression Note   Patient Details  Name: Gina [name] MRN: [id number] Date of Birth: [birth year]  Transition of [facility]) CM/SW Contact  Duwaine GORMAN Aran, LCSW Phone Number: 02/26/2024, 3:40 PM  Clinical Narrative: CSW spoke with daughter, [name] No, regarding SNF bed choice. Daughter reported she prefers the patient go to [facility]. CSW explained that [facility] declined the referral and patient's only bed offers are [facility] and [facility]. Daughter reported she does not want the patient to go to [facility], but would not confirm she agreed to [facility]. Daughter requested that referrals be sent to [facility] as this will be closer for her to visit. Bed search expanded, but patient only received one additional bed offer:  [facility] [address] [place], [place] [address] [phone] Overall rating ? Much below average  Daughter declined [facility], but continued to not make a bed choice. CSW explained several times that patient was medically stable for SNF and daughter will need to make a choice from the bed offers available. Daughter has chosen [facility] and bed was confirmed with Brittany in admissions. Per Brittany, she will meet with the daughter tomorrow at 10am and she is aware 2 weeks of copays will be due upfront.  CSW started insurance authorization on [employer] portal, which is pending. Reference ID # is: [id number].  Expected Discharge Plan: Skilled Nursing Facility Barriers to Discharge: Continued Medical Work up  Expected Discharge Plan and Services In-house Referral: NA Discharge Planning Services: CM Consult Post Acute Care Choice: Skilled Nursing Facility Living arrangements for the past 2 months: Single Family Home Expected Discharge Date: 02/26/24               DME Arranged: N/A DME Agency: NA HH Arranged: NA HH Agency: NA  Social Drivers of Health (SDOH) Interventions SDOH  Screenings   Food Insecurity: No Food Insecurity (02/23/2024)  Housing: Low Risk  (02/23/2024)  Transportation Needs: No Transportation Needs (02/23/2024)  Utilities: Not At Risk (02/23/2024)  Financial Resource Strain: Low Risk  (07/29/2019)   Received from [facility] visits prior to 06/10/2022.  Physical Activity: Sufficiently Active (07/29/2019)   Received from [facility] visits prior to 06/10/2022.  Social Connections: Moderately Integrated (02/23/2024)  Stress: No Stress Concern Present (07/29/2019)   Received from [facility] visits prior to 06/10/2022.  Tobacco Use: Low Risk  (02/22/2024)   Readmission Risk Interventions     No data to display

## 2024-02-27 DIAGNOSIS — Y92009 Unspecified place in unspecified non-institutional (private) residence as the place of occurrence of the external cause: Secondary | ICD-10-CM

## 2024-02-27 DIAGNOSIS — I10 Essential (primary) hypertension: Secondary | ICD-10-CM

## 2024-02-27 DIAGNOSIS — N3000 Acute cystitis without hematuria: Secondary | ICD-10-CM | POA: Diagnosis not present

## 2024-02-27 DIAGNOSIS — W19XXXA Unspecified fall, initial encounter: Secondary | ICD-10-CM | POA: Diagnosis not present

## 2024-02-27 DIAGNOSIS — K219 Gastro-esophageal reflux disease without esophagitis: Secondary | ICD-10-CM

## 2024-02-27 DIAGNOSIS — E876 Hypokalemia: Secondary | ICD-10-CM

## 2024-02-27 DIAGNOSIS — M25562 Pain in left knee: Secondary | ICD-10-CM | POA: Diagnosis not present

## 2024-02-27 DIAGNOSIS — G9341 Metabolic encephalopathy: Secondary | ICD-10-CM | POA: Diagnosis not present

## 2024-02-27 MED ORDER — COLLAGENASE 250 UNIT/GM EX OINT
TOPICAL_OINTMENT | Freq: Every day | CUTANEOUS | Status: DC
  Filled 2024-02-27: qty 30

## 2024-02-27 NOTE — Progress Notes (Signed)
 Occupational Therapy Treatment Patient Details Name: Gina [name] MRN: [id number] DOB: [birth year] Today's Date: 02/27/2024   History of present illness Gina [name] is a 89 y.o. female presened to the ED for evaluation of left knee pain after a fall on 02/22/24. Found to have UTI and acute metabolic encephalopathy. Pt had recent L TKA (12/31/23), imaging after fall negative for acute injury.  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  Recently admitted to the hospital 10/21-10/25 for acute metabolic encephalopathy/delirium secondary to UTI/severe sepsis and SVT.  She was started on metoprolol.  Admitted again 11/6-11/7 for acute metabolic encephalopathy, SIRS, and left renal mass.   OT comments  Patient seen in order to progress with functional mobility and ADL independence. Patient with continued L knee pain, with redness and skin warm to the touch on the top of the old incision (RN notified at end of session). Patient CGA for bed mobility, and mod A for sit<>stand, with posterior lean. Patient able to ambulate minimally with min A, with noted increased effort to advance LLE. Patient only able to complete one bout of ambulation in session. Given decreased activity tolerance, decreased awareness and current assist level, OT recommendation remains appropriate. OT will continue to follow acutely.       If plan is discharge home, recommend the following:  A little help with bathing/dressing/bathroom;A lot of help with bathing/dressing/bathroom;Assistance with cooking/housework;Assist for transportation;Help with stairs or ramp for entrance   Equipment Recommendations  BSC/3in1    Recommendations for Other Services      Precautions / Restrictions Precautions Precautions: Fall       Mobility Bed Mobility Overal bed mobility: Needs Assistance Bed Mobility: Supine to Sit     Supine to sit: Contact guard     General bed mobility comments:  increased time with cues and encouragement, HOB elevated    Transfers Overall transfer level: Needs assistance Equipment used: Rolling walker (2 wheels) Transfers: Sit to/from Stand, Bed to chair/wheelchair/BSC Sit to Stand: Mod assist     Step pivot transfers: Min assist     General transfer comment: Mod A to come into standing, cues for hand placement, posterior lean and increased cues to gain balance prior to ambulation, min A to ambulate minimal feet to the recliner     Balance Overall balance assessment: Needs assistance Sitting-balance support: No upper extremity supported, Feet supported Sitting balance-Leahy Scale: Fair     Standing balance support: Bilateral upper extremity supported, Reliant on assistive device for balance Standing balance-Leahy Scale: Poor Standing balance comment: Reliant on OT and RW, posterior lean with initial stand                           ADL either performed or assessed with clinical judgement   ADL       Grooming: Wash/dry hands;Wash/dry face;Oral care;Set up;Sitting           Upper Body Dressing : Set up;Sitting Upper Body Dressing Details (indicate cue type and reason): donning back gown     Toilet Transfer: Minimal assistance;Stand-pivot;Rolling walker (2 wheels) Toilet Transfer Details (indicate cue type and reason): simulated to recliner           General ADL Comments: Patient seen in order to progress with functional mobility and ADL independence. Patient with continued L knee pain, with redness and skin warm to the touch on the top of the old incision (RN notified  at end of session). Patient CGA for bed mobility, and mod A for sit<>stand, with posterior lean. Patient able to ambulate minimally with min A, with noted increased effort to advance LLE. Patient only able to complete one bout of ambulation in session. Given decreased activity tolerance, decreased awareness and current assist level, OT recommendation  remains appropriate. OT will continue to follow acutely.    Extremity/Trunk Assessment              Occupational Psychologist Communication: Impaired Factors Affecting Communication: Hearing impaired   Cognition Arousal: Alert Behavior During Therapy: WFL for tasks assessed/performed Cognition: Cognition impaired     Awareness: Intellectual awareness intact, Online awareness impaired Memory impairment (select all impairments): Short-term memory Attention impairment (select first level of impairment): Sustained attention Executive functioning impairment (select all impairments): Sequencing, Problem solving OT - Cognition Comments: decreased STM, minimally impulsive, decreased safety awarness                 Following commands: Intact        Cueing   Cueing Techniques: Verbal cues, Tactile cues  Exercises      Shoulder Instructions       General Comments      Pertinent Vitals/ Pain       Pain Assessment Pain Assessment: Faces Faces Pain Scale: Hurts little more Pain Location: L knee Pain Descriptors / Indicators: Aching, Sore, Guarding, Grimacing Pain Intervention(s): Limited activity within patient's tolerance, Monitored during session, Repositioned  Home Living                                          Prior Functioning/Environment              Frequency  Min 2X/week        Progress Toward Goals  OT Goals(current goals can now be found in the care plan section)  Progress towards OT goals: Progressing toward goals  Acute Rehab OT Goals Patient Stated Goal: to get better OT Goal Formulation: With patient Time For Goal Achievement: 03/08/24 Potential to Achieve Goals: Good  Plan      Co-evaluation                 AM-PAC OT 6 Clicks Daily Activity     Outcome Measure   Help from another person eating meals?: None Help from another person taking care of  personal grooming?: A Little Help from another person toileting, which includes using toliet, bedpan, or urinal?: A Lot Help from another person bathing (including washing, rinsing, drying)?: A Lot Help from another person to put on and taking off regular upper body clothing?: A Little Help from another person to put on and taking off regular lower body clothing?: A Lot 6 Click Score: 16    End of Session Equipment Utilized During Treatment: Gait belt;Rolling walker (2 wheels)  OT Visit Diagnosis: Unsteadiness on feet (R26.81);Other abnormalities of gait and mobility (R26.89);Repeated falls (R29.6);History of falling (Z91.81);Pain Pain - Right/Left: Left Pain - part of body: Knee   Activity Tolerance Patient limited by fatigue   Patient Left in chair;with call bell/phone within reach;with chair alarm set   Nurse Communication Mobility status;Other (comment) (L knee redness)        Time: 8960-8944 OT Time Calculation (min): 16 min  Charges:  OT General Charges $OT Visit: 1 Visit OT Treatments $Self Care/Home Management : 8-22 mins  Ronal Gift E. Javaria Knapke, OTR/L Acute Rehabilitation Services [phone]   Ronal Gift Salt 02/27/2024, 11:30 AM

## 2024-02-27 NOTE — TOC Progression Note (Addendum)
 Transition of Care [facility]) - Progression Note   Patient Details  Name: Gina [name] MRN: [id number] Date of Birth: 09-08-1932  [facility]) CM/SW Contact  Duwaine GORMAN Aran, LCSW Phone Number: 02/27/2024, 3:19 PM  Clinical Narrative: Updated PT note added to [employer] portal for review. Insurance authorization remains pending. CSW updated Brittany in admissions at [facility].  Addendum: CSW received call from [facility] that the SNF request was denied and the number for patient/family to call and appeal is [phone]. Clinicals can be faxed to [phone]. CSW provided daughter with number to file an appeal.  Expected Discharge Plan: Skilled Nursing Facility Barriers to Discharge: Family Issues (Daughter refusing to choose from available bed offers)  Expected Discharge Plan and Services In-house Referral: Clinical Social Work Discharge Planning Services: CM Consult Post Acute Care Choice: Skilled Nursing Facility Living arrangements for the past 2 months: Single Family Home Expected Discharge Date: 02/26/24               DME Arranged: N/A DME Agency: NA HH Arranged: NA HH Agency: NA  Social Drivers of Health (SDOH) Interventions SDOH Screenings   Food Insecurity: No Food Insecurity (02/23/2024)  Housing: Low Risk  (02/23/2024)  Transportation Needs: No Transportation Needs (02/23/2024)  Utilities: Not At Risk (02/23/2024)  Financial Resource Strain: Low Risk  (07/29/2019)   Received from [facility] visits prior to 06/10/2022.  Physical Activity: Sufficiently Active (07/29/2019)   Received from [facility] visits prior to 06/10/2022.  Social Connections: Moderately Integrated (02/23/2024)  Stress: No Stress Concern Present (07/29/2019)   Received from [facility] visits prior to 06/10/2022.  Tobacco Use: Low Risk  (02/22/2024)   Readmission Risk Interventions     No data to display

## 2024-02-27 NOTE — Consult Note (Signed)
 WOC Nurse Consult Note: Reason for Consult: sacral wound  Wound type: Unstageable Pressure injury sacrum  Pressure Injury POA: Yes (see admission photo 11/15)  Measurement: see nursing flowsheet; some tunneling present Wound bed:50% necrotic 50% red  Drainage (amount, consistency, odor) see nursing flowsheet  Periwound: erythema  Dressing procedure/placement/frequency:  Cleanse sacral wound with NS, Apply 1/4 thick layer of Santyl to wound bed daily, using a Q tip applicator insert a small piece of saline moistened gauze leaving a portion hanging from wound for easy removal.  Cover with dry gauze and secure with silicone foam.    POC discussed with bedside nurse Appreciate IVAR Idol, RN assistance with this wound.   WOC team will not follow. Reconsult if further needs arise.   Thank you,    Powell Bar MSN, RN-BC, [employer]

## 2024-02-27 NOTE — Progress Notes (Signed)
 PROGRESS NOTE    Gina [name]  FMW:[id number] DOB: September 27, 1932 DOA: 02/22/2024 PCP: Teresa Thersia Jansky, MD    Chief Complaint  Patient presents with   Fall    Brief Narrative:  Patient is a pleasant 89 year old female with history of hypertension CKD 3A left total knee replacement recently on 9/22 previously admitted to our facility late October for sepsis UTI SVT and encephalopathy.  She was admitted again on 11/6 for recurrent episodes of encephalopathy with left renal mass concerning for renal cell carcinoma recommendation for outpatient follow-up and discharged to SNF.  Patient was discharged from SNF 24 hours prior to presenting to our facility after what appears to be a collapse and fall without loss of consciousness.  There was no mechanical fall, as patient did not trip, however her legs gave out as her family was helping her out of the car.   Patient admitted as above after acute fall, considered mechanical but notedly patient has signs and symptoms of UTI, has completed treatment at this time and is otherwise medically stable for discharge.  Given patient's ongoing ambulatory dysfunction she is planned to return back to her skilled nursing facility for ongoing treatment.  Currently awaiting family and patient to choose facility, unfortunately only 2 options were presented to the family due to patient's insurance.  Currently awaiting insurance authorization.  Patient remains medically stable at this time.   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 - Patient with recent left knee arthroplasty stable with no signs or symptoms of infection. - PT/OT. - Awaiting SNF placement.  2.  UTI -Patient noted with abnormal UA and symptoms of encephalopathy on admission. - Urine cultures with > 100,000 colonies of Citrobacter species which is pansensitive. - status post 3 days IV Rocephin . - Clinical improvement. - PCP may need to consider prophylactic antibiotics if moving forward patient continues to have recurrent infections.  3.  Acute metabolic encephalopathy -Likely secondary to UTI as noted above. - Patient noted to have denied any syncopal episodes prior to hospitalization, patient and family deny any head strike during fall. - Mentation close to baseline.  4.  Hypokalemia -Repleted. - Repeat labs in the AM.  5.  Hypertension - Antihypertensive medications noted to have recently been discontinued due to concerns for orthostasis/hypotension.  6.  CKD stage II-IIIa -Stable.  7.  Recently discovered left renal mass of unspecified etiology -Patient noted to have had a recent CT that showed left renal mass with potential thrombus extending into the left renal vein concerning for renal cell carcinoma. - Urology recommending outpatient follow-up. - Dr. Lue reached out to urology per request of patient's daughter for further recommendations in regards to possible biopsy.  8.  Mood disorder -Continue Depakote.  9.  GERD -PPI.   DVT prophylaxis: Lovenox  Code Status: DNR Family Communication: Updated patient and daughter at bedside. Disposition: Patient medically stable.  Awaiting SNF placement.  Status is: Observation The patient remains OBS appropriate and  will d/c before 2 midnights.   Consultants:  Wound care RN  Procedures:  Plain films of the left knee  02/22/2024   Antimicrobials:  Anti-infectives (From admission, onward)    Start     Dose/Rate Route Frequency Ordered Stop   02/24/24 0000  cefTRIAXone  (ROCEPHIN ) 1 g in sodium chloride  0.9 % 100 mL IVPB  Status:  Discontinued        1 g 200 mL/hr over 30 Minutes Intravenous Every 24 hours 02/23/24 0454 02/26/24 0948   02/23/24 0030  cefTRIAXone  (ROCEPHIN ) 1 g in sodium chloride  0.9 % 100 mL IVPB        1 g 200 mL/hr over 30 Minutes Intravenous  Once 02/23/24 0023 02/23/24 0150         Subjective: Patient laying in bed.  States she is feeling much better today.  Denies any chest pain or shortness of breath.  No abdominal pain.  Daughter at bedside.  Objective: Vitals:   02/26/24 1330 02/26/24 2036 02/27/24 0452 02/27/24 1357  BP: (!) 107/53 (!) 121/48 (!) 119/48 97/64  Pulse: 82 78 60 77  Resp: 18 16 18 20   Temp: 98.1 F (36.7 C) 98.6 F (37 C) 98.2 F (36.8 C) 98.3 F (36.8 C)  TempSrc: Oral Oral Oral Oral  SpO2: 96% 96% 95% 97%  Weight:      Height:        Intake/Output Summary (Last 24 hours) at 02/27/2024 1934 Last data filed at 02/26/2024 2300 Gross per 24 hour  Intake 240 ml  Output 350 ml  Net -110 ml   Filed Weights   02/22/24 1634  Weight: 68 kg    Examination:  General exam: Appears calm and comfortable  Respiratory system: Clear to auscultation anterior lung fields.  No wheezes, no crackles, no rhonchi.  Fair air movement.  Speaking full sentences.[name] Respiratory effort normal. Cardiovascular system: S1 & S2 heard, RRR. No JVD, murmurs, rubs, gallops or clicks. No pedal edema. Gastrointestinal system: Abdomen is nondistended, soft and nontender. No organomegaly or masses felt. Normal bowel sounds heard. Central nervous system: Alert and oriented. No focal neurological deficits. Extremities: Symmetric 5 x 5 power. Skin: No rashes, lesions or ulcers Psychiatry: Judgement and insight appear normal. Mood & affect appropriate.     Data Reviewed: I  have personally reviewed following labs and imaging studies  CBC: Recent Labs  Lab 02/22/24 2012  WBC 9.3  NEUTROABS 7.3  HGB 13.1  HCT 38.5  MCV 99.0  PLT 260    Basic Metabolic Panel: Recent Labs  Lab 02/22/24 2012 02/23/24 0537 02/24/24 0524  NA 141 139 137  K 3.3* 3.1* 3.9  CL 105 106 104  CO2 24 25 25   GLUCOSE 102* 79 73  BUN 19 18 16   CREATININE 0.88 0.79 0.81  CALCIUM 9.9 9.3 9.4  MG  --  1.7  --     GFR: Estimated Creatinine Clearance: 42.4 mL/min (by C-G formula based on SCr of 0.81 mg/dL).  Liver Function Tests: Recent Labs  Lab 02/23/24 0537  AST 17  ALT 5  ALKPHOS 86  BILITOT 0.7  PROT 5.5*  ALBUMIN 3.1*    CBG: No results for input(s): GLUCAP in the last 168 hours.   Recent Results (from the past 240 hours)  Urine Culture     Status: Abnormal   Collection Time: 02/22/24 11:33 PM   Specimen: Urine, Random  Result Value Ref Range Status   Specimen Description   Final  URINE, RANDOM Performed at [facility], [address], [place], [place] [address]    Special Requests   Final    NONE Reflexed from [id number] Performed at [facility], [address] [place], [place] [address]    Culture >=100,000 COLONIES/mL CITROBACTER SPECIES (A)  Final   Report Status 02/25/2024 FINAL  Final   Organism ID, Bacteria CITROBACTER SPECIES (A)  Final      Susceptibility   Citrobacter species - MIC*    CEFEPIME <=0.12 SENSITIVE Sensitive     ERTAPENEM <=0.12 SENSITIVE Sensitive     CEFTRIAXONE  <=0.25 SENSITIVE Sensitive     CIPROFLOXACIN 0.12 SENSITIVE Sensitive     GENTAMICIN <=1 SENSITIVE Sensitive     NITROFURANTOIN <=16 SENSITIVE Sensitive     TRIMETH/SULFA <=20 SENSITIVE Sensitive     PIP/TAZO Value in next row Sensitive      <=4 SENSITIVEThis is a modified FDA-approved test that has been validated and its performance characteristics determined by the reporting laboratory.  This laboratory is certified  under the Clinical Laboratory Improvement Amendments CLIA as qualified to perform high complexity clinical laboratory testing.    MEROPENEM Value in next row Sensitive      <=4 SENSITIVEThis is a modified FDA-approved test that has been validated and its performance characteristics determined by the reporting laboratory.  This laboratory is certified under the Clinical Laboratory Improvement Amendments CLIA as qualified to perform high complexity clinical laboratory testing.    * >=100,000 COLONIES/mL CITROBACTER SPECIES  MRSA Next Gen by PCR, Nasal     Status: None   Collection Time: 02/23/24  1:25 PM   Specimen: Nasal Mucosa; Nasal Swab  Result Value Ref Range Status   MRSA by PCR Next Gen NOT DETECTED NOT DETECTED Final    Comment: (NOTE) The GeneXpert MRSA Assay (FDA approved for NASAL specimens only), is one component of a comprehensive MRSA colonization surveillance program. It is not intended to diagnose MRSA infection nor to guide or monitor treatment for MRSA infections. Test performance is not FDA approved in patients less than 2 years old. Performed at [facility], [address], [place], [place] [address]          Radiology Studies: No results found.      Scheduled Meds:  aspirin  EC  81 mg Oral Daily   collagenase   Topical Daily   divalproex  125 mg Oral QPM   enoxaparin  (LOVENOX ) injection  40 mg Subcutaneous Q24H   naphazoline-pheniramine  1 drop Both Eyes Daily   pantoprazole   40 mg Oral Daily   Continuous Infusions:   LOS: 0 days    Time spent: 35 minutes    Toribio Hummer, MD [facility]   To contact the attending provider between 7A-7P or the covering provider during after hours 7P-7A, please log into the web site [email or web address] and access using universal [place] password for that web site. If you do not have the password, please call the hospital operator.  02/27/2024, 7:34 PM

## 2024-02-28 DIAGNOSIS — M25562 Pain in left knee: Secondary | ICD-10-CM | POA: Diagnosis not present

## 2024-02-28 DIAGNOSIS — N3000 Acute cystitis without hematuria: Secondary | ICD-10-CM | POA: Diagnosis not present

## 2024-02-28 DIAGNOSIS — G9341 Metabolic encephalopathy: Secondary | ICD-10-CM | POA: Diagnosis not present

## 2024-02-28 DIAGNOSIS — W19XXXA Unspecified fall, initial encounter: Secondary | ICD-10-CM | POA: Diagnosis not present

## 2024-02-28 LAB — BASIC METABOLIC PANEL WITH GFR
Anion gap: 9 (ref 5–15)
BUN: 10 mg/dL (ref 8–23)
CO2: 27 mmol/L (ref 22–32)
Calcium: 10.5 mg/dL — ABNORMAL HIGH (ref 8.9–10.3)
Chloride: 103 mmol/L (ref 98–111)
Creatinine, Ser: 0.8 mg/dL (ref 0.44–1.00)
GFR, Estimated: >60 mL/min (ref >60)
Glucose, Bld: 99 mg/dL (ref 70–99)
Potassium: 3.8 mmol/L (ref 3.5–5.1)
Sodium: 138 mmol/L (ref 135–145)

## 2024-02-28 LAB — MAGNESIUM: Magnesium: 1.9 mg/dL (ref 1.7–2.4)

## 2024-02-28 MED ORDER — QUETIAPINE FUMARATE 25 MG PO TABS: 25.0000 mg | ORAL_TABLET | Freq: Every evening | ORAL | Status: DC | PRN

## 2024-02-28 NOTE — TOC Progression Note (Signed)
 Transition of Care [facility]) - Progression Note   Patient Details  Name: Gina [name] MRN: [id number] Date of Birth: Aug 08, 1932  [facility]) CM/SW Contact  Duwaine GORMAN Aran, LCSW Phone Number: 02/28/2024, 3:44 PM  Clinical Narrative: Patient's case went to peer to peer. Information provided to hospitalist [phone]). CSW updated daughter. Care management awaiting outcome.  Expected Discharge Plan: Skilled Nursing Facility Barriers to Discharge: Family Issues (Daughter refusing to choose from available bed offers)  Expected Discharge Plan and Services In-house Referral: Clinical Social Work Discharge Planning Services: CM Consult Post Acute Care Choice: Skilled Nursing Facility Living arrangements for the past 2 months: Single Family Home Expected Discharge Date: 02/26/24               DME Arranged: N/A DME Agency: NA HH Arranged: NA HH Agency: NA  Social Drivers of Health (SDOH) Interventions SDOH Screenings   Food Insecurity: No Food Insecurity (02/23/2024)  Housing: Low Risk  (02/23/2024)  Transportation Needs: No Transportation Needs (02/23/2024)  Utilities: Not At Risk (02/23/2024)  Financial Resource Strain: Low Risk  (07/29/2019)   Received from [facility] visits prior to 06/10/2022.  Physical Activity: Sufficiently Active (07/29/2019)   Received from [facility] visits prior to 06/10/2022.  Social Connections: Moderately Integrated (02/23/2024)  Stress: No Stress Concern Present (07/29/2019)   Received from [facility] visits prior to 06/10/2022.  Tobacco Use: Low Risk  (02/22/2024)   Readmission Risk Interventions     No data to display

## 2024-02-28 NOTE — Progress Notes (Signed)
 PROGRESS NOTE    Gina [name]  FMW:[id number] DOB: 19-Sep-1932 DOA: 02/22/2024 PCP: Teresa Thersia Jansky, MD    Chief Complaint  Patient presents with   Fall    Brief Narrative:  Patient is a pleasant 89 year old female with history of hypertension CKD 3A left total knee replacement recently on 9/22 previously admitted to our facility late October for sepsis UTI SVT and encephalopathy.  She was admitted again on 11/6 for recurrent episodes of encephalopathy with left renal mass concerning for renal cell carcinoma recommendation for outpatient follow-up and discharged to SNF.  Patient was discharged from SNF 24 hours prior to presenting to our facility after what appears to be a collapse and fall without loss of consciousness.  There was no mechanical fall, as patient did not trip, however her legs gave out as her family was helping her out of the car.   Patient admitted as above after acute fall, considered mechanical but notedly patient has signs and symptoms of UTI, has completed treatment at this time and is otherwise medically stable for discharge.  Given patient's ongoing ambulatory dysfunction she is planned to return back to her skilled nursing facility for ongoing treatment.  Currently awaiting family and patient to choose facility, unfortunately only 2 options were presented to the family due to patient's insurance.  Currently awaiting insurance authorization.  Patient remains medically stable at this time.   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 - Patient with recent left knee arthroplasty stable with no signs or symptoms of infection. -Plain films of the left knee with left total knee arthroplasty in expected position.  No acute abnormalities noted. - PT/OT. -Patient follow-up with orthopedics as previously scheduled. - Awaiting SNF placement.  2.  UTI -Patient noted with abnormal UA and symptoms of encephalopathy on admission. - Urine cultures with > 100,000 colonies of Citrobacter species which is pansensitive. - status post 3 days IV Rocephin . - Clinical improvement. - PCP may need to consider prophylactic antibiotics if moving forward patient continues to have recurrent infections.  3.  Acute metabolic encephalopathy -Likely secondary to UTI as noted above. - Patient noted to have denied any syncopal episodes prior to hospitalization, patient and family deny any head strike during fall. - Mentation close to baseline.  4.  Hypokalemia -Repleted.  5.  Hypertension - Antihypertensive medications noted to have recently been discontinued due to concerns for orthostasis/hypotension. - BP stable.  6.  CKD stage II-IIIa -Stable.  7.  Recently discovered left renal mass of unspecified etiology -Patient noted to have had a recent CT that showed left renal mass with potential thrombus extending into the left renal vein concerning for renal cell carcinoma. - Dr. Lue reached out to urology per request of patient's daughter for further recommendations in regards to possible biopsy. -- Urology recommending outpatient follow-up.  8.  Mood disorder - Continue Depakote .   9.  GERD - Continue PPI.   DVT prophylaxis: Lovenox  Code Status:  DNR Family Communication: Updated patient.  No family at bedside.  Disposition: Patient medically stable.  Peer to peer done this morning.  Awaiting SNF  placement.  Status is: Observation The patient remains OBS appropriate and will d/c before 2 midnights.   Consultants:  Wound care RN  Procedures:  Plain films of the left knee 02/22/2024   Antimicrobials:  Anti-infectives (From admission, onward)    Start     Dose/Rate Route Frequency Ordered Stop   02/24/24 0000  cefTRIAXone  (ROCEPHIN ) 1 g in sodium chloride  0.9 % 100 mL IVPB  Status:  Discontinued        1 g 200 mL/hr over 30 Minutes Intravenous Every 24 hours 02/23/24 0454 02/26/24 0948   02/23/24 0030  cefTRIAXone  (ROCEPHIN ) 1 g in sodium chloride  0.9 % 100 mL IVPB        1 g 200 mL/hr over 30 Minutes Intravenous  Once 02/23/24 0023 02/23/24 0150         Subjective: Patient sitting up in recliner.  Denies any chest pain or shortness of breath.  No abdominal pain.  Tolerating current diet.   Objective: Vitals:   02/27/24 0452 02/27/24 1357 02/27/24 2128 02/28/24 0502  BP: (!) 119/48 97/64 (!) 134/51 139/60  Pulse: 60 77 85 85  Resp: 18 20 18 19   Temp: 98.2 F (36.8 C) 98.3 F (36.8 C) 97.8 F (36.6 C) 97.8 F (36.6 C)  TempSrc: Oral Oral Oral Oral  SpO2: 95% 97% 96% 98%  Weight:      Height:        Intake/Output Summary (Last 24 hours) at 02/28/2024 1228 Last data filed at 02/28/2024 0534 Gross per 24 hour  Intake --  Output 350 ml  Net -350 ml   Filed Weights   02/22/24 1634  Weight: 68 kg    Examination:  General exam: NAD. Respiratory system: CTAB.  No wheezes, no crackles, no rhonchi.  Fair air movement.  Speaking in full sentences.  Normal respiratory effort.   Cardiovascular system: Regular rate rhythm no murmurs rubs or gallops.  No JVD.  No pitting lower extremity edema.  Gastrointestinal system: Abdomen is soft, nontender, nondistended, positive bowel sounds.  No rebound.  No guarding. Central nervous system: Alert and oriented. No focal neurological deficits. Extremities: Symmetric 5 x 5 power. Skin: No rashes, lesions or  ulcers Psychiatry: Judgement and insight appear normal. Mood & affect appropriate.     Data Reviewed: I have personally reviewed following labs and imaging studies  CBC: Recent Labs  Lab 02/22/24 2012  WBC 9.3  NEUTROABS 7.3  HGB 13.1  HCT 38.5  MCV 99.0  PLT 260    Basic Metabolic Panel: Recent Labs  Lab 02/22/24 2012 02/23/24 0537 02/24/24 0524 02/28/24 0602  NA 141 139 137 138  K 3.3* 3.1* 3.9 3.8  CL 105 106 104 103  CO2 24 25 25 27   GLUCOSE 102* 79 73 99  BUN 19 18 16 10   CREATININE 0.88 0.79 0.81 0.80  CALCIUM 9.9 9.3 9.4 10.5*  MG  --  1.7  --  1.9    GFR: Estimated Creatinine Clearance: 42.9 mL/min (by C-G formula based on SCr of 0.8 mg/dL).  Liver Function Tests: Recent Labs  Lab 02/23/24 0537  AST 17  ALT 5  ALKPHOS 86  BILITOT 0.7  PROT 5.5*  ALBUMIN 3.1*    CBG: No results for input(s): GLUCAP in the last 168 hours.   Recent Results (from the past 240 hours)  Urine Culture     Status: Abnormal   Collection Time: 02/22/24 11:33 PM   Specimen: Urine, Random  Result Value Ref Range Status   Specimen Description   Final    URINE, RANDOM Performed at [facility], [address] [place], [place] [address]    Special Requests   Final    NONE Reflexed from [id number] Performed at [facility], [address], [place], [place] [address]    Culture >=100,000 COLONIES/mL CITROBACTER SPECIES (A)  Final   Report Status 02/25/2024 FINAL  Final   Organism ID, Bacteria CITROBACTER SPECIES (A)  Final      Susceptibility   Citrobacter species - MIC*    CEFEPIME <=0.12 SENSITIVE Sensitive     ERTAPENEM <=0.12 SENSITIVE Sensitive     CEFTRIAXONE  <=0.25 SENSITIVE Sensitive     CIPROFLOXACIN 0.12 SENSITIVE Sensitive     GENTAMICIN <=1 SENSITIVE Sensitive     NITROFURANTOIN <=16 SENSITIVE Sensitive     TRIMETH/SULFA <=20 SENSITIVE Sensitive     PIP/TAZO Value in next row Sensitive      <=4 SENSITIVEThis is a  modified FDA-approved test that has been validated and its performance characteristics determined by the reporting laboratory.  This laboratory is certified under the Clinical Laboratory Improvement Amendments CLIA as qualified to perform high complexity clinical laboratory testing.    MEROPENEM Value in next row Sensitive      <=4 SENSITIVEThis is a modified FDA-approved test that has been validated and its performance characteristics determined by the reporting laboratory.  This laboratory is certified under the Clinical Laboratory Improvement Amendments CLIA as qualified to perform high complexity clinical laboratory testing.    * >=100,000 COLONIES/mL CITROBACTER SPECIES  MRSA Next Gen by PCR, Nasal     Status: None   Collection Time: 02/23/24  1:25 PM   Specimen: Nasal Mucosa; Nasal Swab  Result Value Ref Range Status   MRSA by PCR Next Gen NOT DETECTED NOT DETECTED Final    Comment: (NOTE) The GeneXpert MRSA Assay (FDA approved for NASAL specimens only), is one component of a comprehensive MRSA colonization surveillance program. It is not intended to diagnose MRSA infection nor to guide or monitor treatment for MRSA infections. Test performance is not FDA approved in patients less than 53 years old. Performed at [facility], [address] [place], [place] [address]          Radiology Studies: No results found.      Scheduled Meds:  aspirin  EC  81 mg Oral Daily   collagenase   Topical Daily   divalproex  125 mg Oral QPM   enoxaparin  (LOVENOX ) injection  40 mg Subcutaneous Q24H   naphazoline-pheniramine  1 drop Both Eyes Daily   pantoprazole   40 mg Oral Daily   Continuous Infusions:   LOS: 0 days    Time spent: 35 minutes    Toribio Hummer, MD [facility]   To contact the attending provider between 7A-7P or the covering provider during after hours 7P-7A, please log into the web site [email or web address] and access using universal [facility] password for that web site. If you do not have the password, please call the hospital operator.  02/28/2024, 12:28 PM

## 2024-02-28 NOTE — Progress Notes (Signed)
 Mobility Specialist - Progress Note   02/28/24 1027  Mobility  Activity Ambulated with assistance  Level of Assistance Moderate assist, patient does 50-74%  Assistive Device Front wheel walker  Distance Ambulated (ft) 4 ft  Range of Motion/Exercises Active  Activity Response Tolerated well  Mobility visit 1 Mobility  Mobility Specialist Start Time (ACUTE ONLY) 1015  Mobility Specialist Stop Time (ACUTE ONLY) 1027  Mobility Specialist Time Calculation (min) (ACUTE ONLY) 12 min   Pt was found in bed an eager to mobilize. Was left on recliner chair with all needs met. Call bell in reach and chair alarm on. RN notified.   Erminio Leos,  Mobility Specialist Can be reached via Secure Chat

## 2024-02-28 NOTE — Progress Notes (Signed)
 Patient's daughter [name] called after receiving a phone call from the patient reporting she had been given a medication she was unaware of. At shift change, pt was A&Ox2, now patient is A&O to self only. RN informed daughter that the patient was unable to provide a pain score yelling, moaning, grimacing, reporting nagging discomfort in her buttock, and attempting to get out of bed. PRN Tramadol  was administered successfully after educating; however, the patient eventually spit out part of the dose. It is unclear how much of the medication was absorbed due to the pill being practically dissolved. [name] requested that the patient not call her overnight as she must wake early for her husband's surgery. Reassurance was provided to both the patient and the daughter. The patient is now calm and vitals signs are stable.

## 2024-02-29 DIAGNOSIS — I10 Essential (primary) hypertension: Secondary | ICD-10-CM | POA: Diagnosis not present

## 2024-02-29 DIAGNOSIS — M25562 Pain in left knee: Secondary | ICD-10-CM | POA: Diagnosis not present

## 2024-02-29 DIAGNOSIS — W19XXXA Unspecified fall, initial encounter: Secondary | ICD-10-CM | POA: Diagnosis not present

## 2024-02-29 DIAGNOSIS — G9341 Metabolic encephalopathy: Secondary | ICD-10-CM | POA: Diagnosis not present

## 2024-02-29 MED ORDER — COLLAGENASE 250 UNIT/GM EX OINT: TOPICAL_OINTMENT | Freq: Every day | CUTANEOUS | Status: DC

## 2024-02-29 MED ORDER — TRAMADOL HCL 50 MG PO TABS: 50.0000 mg | ORAL_TABLET | Freq: Three times a day (TID) | ORAL | 0 refills | Status: AC | PRN

## 2024-02-29 NOTE — Discharge Summary (Signed)
 Physician Discharge Summary  Gina [name] FMW:[id number] DOB: 11/17/1932 DOA: 02/22/2024  PCP: Teresa Thersia Jansky, MD  Admit date: 02/22/2024 Discharge date: 02/29/2024  Time spent: 60 minutes  Recommendations for Outpatient Follow-up:  Follow-up with MD at skilled nursing facility.  Patient will need a basic metabolic profile done in 1 week to follow-up on electrolytes and renal function. Follow-up with Dr. Melodi, orthopedics in 1 to 2 weeks. Follow-up with Teresa Thersia Jansky, MD in 2 to 3 weeks.   Discharge Diagnoses:  Principal Problem:   Left knee pain Active Problems:   Acute metabolic encephalopathy   Fall at home, initial encounter   Hypokalemia   UTI (urinary tract infection)   Hypertension   Gastroesophageal reflux disease   Discharge Condition: Stable and improved.  Diet recommendation: Heart healthy  Filed Weights   02/22/24 1634  Weight: 68 kg    History of present illness:  HPI per Dr. Alfornia Gina [name] is a 89 y.o. 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  Recently admitted to the hospital 10/21-10/25 for acute metabolic encephalopathy/delirium secondary to UTI/severe sepsis and SVT.  She was started on metoprolol .  Admitted again 11/6-11/7 for acute metabolic encephalopathy, SIRS, and left renal mass.  CT was concerning for a left renal mass with tumor thrombus extending into a branch of the left renal vein highly concerning for renal cell carcinoma.  Urology was consulted and recommended outpatient follow-up.  Metoprolol  and valsartan  were discontinued due to hypotension.  She was discharged to SNF.   Patient presents to the ED for evaluation of left knee pain after a fall.  Vital signs on arrival to the ED: Temperature 97.6 F, pulse 74, respiratory rate 16, blood pressure 113/55, and SpO2 96% on room air.  Labs showing no leukocytosis or anemia, potassium 3.3,  glucose 102.  UA with positive nitrite, large leukocytes, 11-20 RBCs, >50 WBCs, and many bacteria.  Urine culture in process.  X-ray of left knee showing arthroplasty in expected position and no fracture or dislocation.  Knee sleeve applied.  Patient was unable to ambulate safely.  Ceftriaxone  and tramadol  given.  TRH called to admit.   History limited as patient is hard of hearing.  She is currently oriented to person and place, she knows the month is November but thinks the year is 62.  She is reporting pain in her left knee since after having a fall yesterday although she is not able to tell how exactly she fell.  Also reports having bladder infection and states I pee a whole lot.  Denies fevers, headaches, neck pain, cough, shortness of breath, chest pain, nausea, vomiting, abdominal pain, diarrhea, constipation, back or flank pain.  No family at bedside at this time.  I spoke to the patient's daughter [name] over the phone who informed me that the patient was just discharged from SNF yesterday evening and as soon as they reached home, daughter was helping the patient walk back to the house from the car when she fell due to generalized weakness.  Daughter states patient's legs buckled and she fell on her knees although she does not believe that the patient hit her knees on the floor too hard.  Daughter then assisted the patient to the ground.  No head injury or loss of consciousness reported.  Daughter states patient has had some ongoing confusion since her recent hospitalization for UTI and never went back to baseline.  Brief/Interim  Summary: Patient is a pleasant 89 year old female with history of hypertension CKD 3A left total knee replacement recently on 9/22 previously admitted to our facility late October for sepsis UTI SVT and encephalopathy.  She was admitted again on 11/6 for recurrent episodes of encephalopathy with left renal mass concerning for renal cell carcinoma recommendation for  outpatient follow-up and discharged to SNF.  Patient was discharged from SNF 24 hours prior to presenting to our facility after what appears to be a collapse and fall without loss of consciousness.  There was no mechanical fall, as patient did not trip, however her legs gave out as her family was helping her out of the car.   Patient admitted as above after acute fall, considered mechanical but notedly patient has signs and symptoms of UTI, has completed treatment at this time and is otherwise medically stable for discharge.  Given patient's ongoing ambulatory dysfunction she is planned to return back to her skilled nursing facility for ongoing treatment.      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 - Patient with recent left knee arthroplasty stable with no signs or symptoms of infection. -Plain films of the left knee with left total knee arthroplasty in expected position.  No acute abnormalities noted. - PT/OT. -Patient to follow-up with orthopedics as previously scheduled. - Patient will be discharged to skilled nursing facility.     2.  UTI -Patient noted with abnormal UA and symptoms of encephalopathy on admission. - Urine cultures with > 100,000 colonies of Citrobacter species which is pansensitive. - status post 3 days IV Rocephin . - Patient improved clinically.   - PCP may need to consider prophylactic antibiotics if moving forward patient continues to have recurrent infections.   3.  Acute metabolic encephalopathy -Likely secondary to UTI as noted above. - Patient noted to have denied any syncopal episodes prior to hospitalization, patient  and family deny any head strike during fall. -Status post full course of antibiotic treatment for UTI. - Mentation close to baseline by day of discharge.   4.  Hypokalemia -Repleted.   5.  Hypertension - Antihypertensive medications noted to have recently been discontinued due to concerns for orthostasis/hypotension. - BP remained stable.   6.  CKD stage II-IIIa -Stable.   7.  Recently discovered left renal mass of unspecified etiology -Patient noted to have had a recent CT that showed left renal mass with potential thrombus extending into the left renal vein concerning for renal cell carcinoma. - Dr. Lue reached out to urology per request of patient's daughter for further recommendations in regards to possible biopsy. -- Urology recommended outpatient follow-up.   8.  Mood disorder - Patient maintained on home regimen Depakote .     9.  GERD - Patient maintained on PPI.  PPI.    Procedures: Plain films of the left knee 02/22/2024    Consultations: Wound care RN   Discharge Exam: Vitals:   02/28/24 1347 02/29/24 0551  BP: (!) 112/55 (!) 143/51  Pulse: 86 83  Resp: 18 16  Temp: 97.9 F (36.6 C) 98 F (36.7 C)  SpO2: 96% 98%    General: NAD Cardiovascular: RRR no murmurs rubs or gallops.  No JVD.  No lower extremity edema. Respiratory: Clear to auscultation bilaterally.  No wheezes, no crackles, no rhonchi.  Fair air movement.  Speaking in full sentences.  Discharge Instructions   Discharge Instructions     Call MD for:  difficulty breathing, headache or visual disturbances   Complete by: As directed    Call MD for:  extreme fatigue   Complete by: As directed    Call MD for:  hives   Complete by: As directed    Call MD for:  persistant dizziness or light-headedness   Complete by: As directed    Call MD for:  persistant nausea and vomiting   Complete by: As directed    Call MD for:  severe uncontrolled pain   Complete by: As directed    Call MD for:   temperature >100.4   Complete by: As directed    Diet - low sodium heart healthy   Complete by: As directed    Diet - low sodium heart healthy   Complete by: As directed    Discharge wound care:   Complete by: As directed    Wound care  Daily      Comments: Cleanse sacral wound with NS, Apply 1/4 thick layer of Santyl  to wound bed daily, using a Q tip applicator insert a small piece of saline moistened gauze into wound bed leaving a portion hanging from wound for easy removal.  Cover with dry gauze and secure with silicone foam.   Increase activity slowly   Complete by: As directed    Increase activity slowly   Complete by: As directed    No wound care   Complete by: As directed       Allergies as of 02/29/2024       Reactions   Codeine Other (See Comments)   Insomnia   Strawberry Extract Hives        Medication List     TAKE these medications    acetaminophen  500 MG tablet Commonly known as: TYLENOL  Take 1,000 mg by mouth every 8 (eight) hours as needed for moderate pain (pain score 4-6).   aspirin  EC 81 MG tablet Take 81 mg by mouth daily.   carboxymethylcellulose 0.5 % Soln Commonly known as: REFRESH PLUS Place 1 drop into both eyes daily.   collagenase  250 UNIT/GM ointment Commonly known as: SANTYL  Apply topically daily. Apply 1/4 thick layer of Santyl  to wound bed daily, using a Q tip applicator insert a small piece of saline moistened gauze (see wound care order) Start taking on: March 01, 2024   divalproex  125 MG DR tablet Commonly known as: DEPAKOTE  Take 125 mg by mouth every evening.   omeprazole 20 MG capsule Commonly known as: PRILOSEC Take 20 mg by mouth daily.   PRO-STAT PO Take 30 mLs by mouth daily.   traMADol  50 MG tablet Commonly known as: ULTRAM  Take 1-2 tablets (50-100 mg total) by mouth every 8 (eight) hours as needed for moderate pain (pain score 4-6).               Discharge Care Instructions  (From admission,  onward)           Start     Ordered   02/29/24 0000  Discharge wound care:       Comments: Wound care  Daily      Comments: Cleanse sacral wound with NS, Apply 1/4 thick layer of Santyl  to wound bed daily, using a Q tip applicator insert a small piece  of saline moistened gauze into wound bed leaving a portion hanging from wound for easy removal.  Cover with dry gauze and secure with silicone foam.   02/29/24 1338           Allergies  Allergen Reactions   Codeine Other (See Comments)    Insomnia    Strawberry Extract Hives    Follow-up Information     MD at SNF Follow up.          Melodi Lerner, MD. Schedule an appointment as soon as possible for a visit in 2 week(s).   Specialty: Orthopedic Surgery Why: Follow-up in 1 to 2 weeks. Contact information: [address] [place] [address] [phone]         Teresa Thersia Jansky, MD. Schedule an appointment as soon as possible for a visit in 2 week(s).   Specialty: Family Medicine Why: Follow-up in 2 to 3 weeks Contact information: [address] [place] [address] [phone]                  The results of significant diagnostics from this hospitalization (including imaging, microbiology, ancillary and laboratory) are listed below for reference.    Significant Diagnostic Studies: DG Knee Complete 4 Views Left Result Date: 02/22/2024 EXAM: 4 VIEW(S) XRAY OF THE LEFT KNEE 02/22/2024 04:51:00 PM COMPARISON: 10/15/2023 CLINICAL HISTORY: fall FINDINGS: BONES AND JOINTS: Interval left total knee arthroplasty is in place. Small effusion in the suprapatellar bursa. No acute fracture. No focal osseous lesion. No joint dislocation. SOFT TISSUES: Femoropopliteal arterial calcifications. The soft tissues are otherwise unremarkable. IMPRESSION: 1. Interval left total knee arthroplasty in expected position. Electronically signed by: Morgane Naveau MD 02/22/2024 05:02 PM EST RP  Workstation: [id number]   CT Angio Chest PE W and/or Wo Contrast Result Date: 02/14/2024 EXAM: CTA CHEST PE WITHOUT AND WITH CONTRAST CT ABDOMEN AND PELVIS WITH CONTRAST 02/14/2024 09:00:45 PM TECHNIQUE: CTA of the chest was performed after the administration of 100 mL of iohexol  (OMNIPAQUE ) 350 MG/ML injection. Multiplanar reformatted images are provided for review. MIP images are provided for review. CT of the abdomen and pelvis was performed with the administration of intravenous contrast. Automated exposure control, iterative reconstruction, and/or weight based adjustment of the mA/kV was utilized to reduce the radiation dose to as low as reasonably achievable. COMPARISON: Comparison with same day chest x-ray and CT chest 11/21/2021. CLINICAL HISTORY: Pulmonary embolism (PE) suspected, high prob. Acute um localized abdominal pain. Cough. FINDINGS: CHEST: PULMONARY ARTERIES: Pulmonary arteries are adequately opacified for evaluation. No intraluminal filling defect to suggest pulmonary embolism. Main pulmonary artery is normal in caliber. MEDIASTINUM: No mediastinal lymphadenopathy. The heart and pericardium demonstrate no acute abnormality. Coronary artery and aortic atherosclerotic calcification. There is no acute abnormality of the thoracic aorta. LUNGS AND PLEURA: Bronchial wall thickening with mucus plugging and bronchiectasis/bronchiolectasis in the lower lobes. No focal consolidation or pulmonary edema. No pleural effusion or pneumothorax. SOFT TISSUES AND BONES: No acute bone or soft tissue abnormality. ABDOMEN AND PELVIS: LIVER: The liver is unremarkable. GALLBLADDER AND BILE DUCTS: Distended gallbladder. No biliary ductal dilatation. SPLEEN: Spleen demonstrates no acute abnormality. 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 No pancreatic ductal dilation. ADRENAL GLANDS: Stable left adrenal adenoma. KIDNEYS, URETERS AND BLADDER: Punctate  nonobstructing bilateral nephrolithiasis. Heterogeneously enhancing mass in the lower pole of the left kidney measures 4.7 cm. Tumor thrombus extends into an inferior branch of the left renal  vein. No hydronephrosis. No perinephric or periureteral stranding. Urinary bladder is unremarkable. GI AND BOWEL: Diverticulosis without diverticulitis. Stomach and duodenal sweep demonstrate no acute abnormality. There is no bowel obstruction. No abnormal bowel wall thickening or distension. REPRODUCTIVE: 5.7 cm fibroid in the uterus. PERITONEUM AND RETROPERITONEUM: No ascites or free air. LYMPH NODES: No lymphadenopathy. BONES AND SOFT TISSUES: No acute abnormality of the visualized bones. No focal soft tissue abnormality. IMPRESSION: 1. No pulmonary embolism. 2. Findings consistent with chronic bronchitis. 3. 4.7 cm enhancing mass in the lower pole of the left kidney highly suspicious for renal cell carcinoma. There is tumor thrombus extending into an inferior branch of the left renal vein; recommend urology consultation. 4. 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 Follow up as appropriate given age and comorbidities. Electronically signed by: Norman Gatlin MD 02/14/2024 09:22 PM EST RP Workstation: [id number]   CT ABDOMEN PELVIS W CONTRAST Result Date: 02/14/2024 EXAM: CTA CHEST PE WITHOUT AND WITH CONTRAST CT ABDOMEN AND PELVIS WITH CONTRAST 02/14/2024 09:00:45 PM TECHNIQUE: CTA of the chest was performed after the administration of 100 mL of iohexol  (OMNIPAQUE ) 350 MG/ML injection. Multiplanar reformatted images are provided for review. MIP images are provided for review. CT of the abdomen and pelvis was performed with the administration of intravenous contrast. Automated exposure control, iterative reconstruction, and/or weight based adjustment of the mA/kV was utilized to reduce the radiation dose to as low as reasonably achievable.  COMPARISON: Comparison with same day chest x-ray and CT chest 11/21/2021. CLINICAL HISTORY: Pulmonary embolism (PE) suspected, high prob. Acute um localized abdominal pain. Cough. FINDINGS: CHEST: PULMONARY ARTERIES: Pulmonary arteries are adequately opacified for evaluation. No intraluminal filling defect to suggest pulmonary embolism. Main pulmonary artery is normal in caliber. MEDIASTINUM: No mediastinal lymphadenopathy. The heart and pericardium demonstrate no acute abnormality. Coronary artery and aortic atherosclerotic calcification. There is no acute abnormality of the thoracic aorta. LUNGS AND PLEURA: Bronchial wall thickening with mucus plugging and bronchiectasis/bronchiolectasis in the lower lobes. No focal consolidation or pulmonary edema. No pleural effusion or pneumothorax. SOFT TISSUES AND BONES: No acute bone or soft tissue abnormality. ABDOMEN AND PELVIS: LIVER: The liver is unremarkable. GALLBLADDER AND BILE DUCTS: Distended gallbladder. No biliary ductal dilatation. SPLEEN: Spleen demonstrates no acute abnormality. 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 No pancreatic ductal dilation. ADRENAL GLANDS: Stable left adrenal adenoma. KIDNEYS, URETERS AND BLADDER: Punctate nonobstructing bilateral nephrolithiasis. Heterogeneously enhancing mass in the lower pole of the left kidney measures 4.7 cm. Tumor thrombus extends into an inferior branch of the left renal vein. No hydronephrosis. No perinephric or periureteral stranding. Urinary bladder is unremarkable. GI AND BOWEL: Diverticulosis without diverticulitis. Stomach and duodenal sweep demonstrate no acute abnormality. There is no bowel obstruction. No abnormal bowel wall thickening or distension. REPRODUCTIVE: 5.7 cm fibroid in the uterus. PERITONEUM AND RETROPERITONEUM: No ascites or free air. LYMPH NODES: No lymphadenopathy. BONES AND SOFT TISSUES: No acute abnormality of the visualized  bones. No focal soft tissue abnormality. IMPRESSION: 1. No pulmonary embolism. 2. Findings consistent with chronic bronchitis. 3. 4.7 cm enhancing mass in the lower pole of the left kidney highly suspicious for renal cell carcinoma. There is tumor thrombus extending into an inferior branch of the left renal vein; recommend urology consultation. 4. 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 Follow up as appropriate  given age and comorbidities. Electronically signed by: Norman Gatlin MD 02/14/2024 09:22 PM EST RP Workstation: [id number]   DG Chest Portable 1 View Result Date: 02/14/2024 CLINICAL DATA:  Cough. EXAM: PORTABLE CHEST 1 VIEW COMPARISON:  Chest recommended 04/27/2022. FINDINGS: Shallow inspiration. No focal consolidation, pleural effusion or pneumothorax. The cardiac silhouette is within limits. Atherosclerotic calcification of the aorta. No acute osseous pathology. Partially visualized right shoulder arthroplasty. IMPRESSION: No active disease. Electronically Signed   By: Vanetta Chou M.D.   On: 02/14/2024 18:03    Microbiology: Recent Results (from the past 240 hours)  Urine Culture     Status: Abnormal   Collection Time: 02/22/24 11:33 PM   Specimen: Urine, Random  Result Value Ref Range Status   Specimen Description   Final    URINE, RANDOM Performed at [facility], [address] [place], [place] [address]    Special Requests   Final    NONE Reflexed from [id number] Performed at [facility], [address] [place], [place] [address]    Culture >=100,000 COLONIES/mL CITROBACTER SPECIES (A)  Final   Report Status 02/25/2024 FINAL  Final   Organism ID, Bacteria CITROBACTER SPECIES (A)  Final      Susceptibility   Citrobacter species - MIC*    CEFEPIME <=0.12 SENSITIVE Sensitive     ERTAPENEM <=0.12 SENSITIVE Sensitive     CEFTRIAXONE  <=0.25 SENSITIVE  Sensitive     CIPROFLOXACIN 0.12 SENSITIVE Sensitive     GENTAMICIN <=1 SENSITIVE Sensitive     NITROFURANTOIN <=16 SENSITIVE Sensitive     TRIMETH/SULFA <=20 SENSITIVE Sensitive     PIP/TAZO Value in next row Sensitive      <=4 SENSITIVEThis is a modified FDA-approved test that has been validated and its performance characteristics determined by the reporting laboratory.  This laboratory is certified under the Clinical Laboratory Improvement Amendments CLIA as qualified to perform high complexity clinical laboratory testing.    MEROPENEM Value in next row Sensitive      <=4 SENSITIVEThis is a modified FDA-approved test that has been validated and its performance characteristics determined by the reporting laboratory.  This laboratory is certified under the Clinical Laboratory Improvement Amendments CLIA as qualified to perform high complexity clinical laboratory testing.    * >=100,000 COLONIES/mL CITROBACTER SPECIES  MRSA Next Gen by PCR, Nasal     Status: None   Collection Time: 02/23/24  1:25 PM   Specimen: Nasal Mucosa; Nasal Swab  Result Value Ref Range Status   MRSA by PCR Next Gen NOT DETECTED NOT DETECTED Final    Comment: (NOTE) The GeneXpert MRSA Assay (FDA approved for NASAL specimens only), is one component of a comprehensive MRSA colonization surveillance program. It is not intended to diagnose MRSA infection nor to guide or monitor treatment for MRSA infections. Test performance is not FDA approved in patients less than 26 years old. Performed at [facility], [address] [place], [place] [address]      Labs: Basic Metabolic Panel: Recent Labs  Lab 02/22/24 2012 02/23/24 0537 02/24/24 0524 02/28/24 0602  NA 141 139 137 138  K 3.3* 3.1* 3.9 3.8  CL 105 106 104 103  CO2 24 25 25 27   GLUCOSE 102* 79 73 99  BUN 19 18 16 10   CREATININE 0.88 0.79 0.81 0.80  CALCIUM 9.9 9.3 9.4 10.5*  MG  --  1.7  --  1.9   Liver Function Tests: Recent Labs   Lab 02/23/24  0537  AST 17  ALT 5  ALKPHOS 86  BILITOT 0.7  PROT 5.5*  ALBUMIN 3.1*   No results for input(s): LIPASE, AMYLASE in the last 168 hours. No results for input(s): AMMONIA in the last 168 hours. CBC: Recent Labs  Lab 02/22/24 2012  WBC 9.3  NEUTROABS 7.3  HGB 13.1  HCT 38.5  MCV 99.0  PLT 260   Cardiac Enzymes: No results for input(s): CKTOTAL, CKMB, CKMBINDEX, TROPONINI in the last 168 hours. BNP: BNP (last 3 results) No results for input(s): BNP in the last 8760 hours.  ProBNP (last 3 results) No results for input(s): PROBNP in the last 8760 hours.  CBG: No results for input(s): GLUCAP in the last 168 hours.     Signed:  Toribio Hummer MD.  [facility] 02/29/2024, 1:47 PM

## 2024-02-29 NOTE — Progress Notes (Signed)
 Physical Therapy Treatment Patient Details Name: Gina [name] MRN: [id number] DOB: 1932/07/09 Today's Date: 02/29/2024   History of Present Illness Gina [name] is a 89 y.o. female presened to the ED for evaluation of left knee pain after a fall on 02/22/24. Found to have UTI and acute metabolic encephalopathy. Pt had recent L TKA (12/31/23), imaging after fall negative for acute injury.  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  Recently admitted to the hospital 10/21-10/25 for acute metabolic encephalopathy/delirium secondary to UTI/severe sepsis and SVT.  She was started on metoprolol .  Admitted again 11/6-11/7 for acute metabolic encephalopathy, SIRS, and left renal mass.    PT Comments   Pt admitted with above diagnosis.  Pt currently with functional limitations due to the deficits listed below (see PT Problem List). Pt in bed when PT arrived. Pt agreeable to therapy intervention. Pt reports ongoing L LE pain secondary to fall. Pt required increased time, cues, use of hospital bed features with CGA for supine to sit, CGA for sit to stand  from EOB and from elevated commode seat in bathroom, pt able to participate with gait tasks in personal room bed to bathroom 18 feet and bathroom to recliner 20 feet. Pt left seated in recliner and all needs in place. Patient will benefit from continued inpatient follow up therapy, <3 hours/day.  Pt will benefit from acute skilled PT to increase their independence and safety with mobility to allow discharge.      If plan is discharge home, recommend the following: Assistance with cooking/housework;Assist for transportation;Help with stairs or ramp for entrance;A lot of help with bathing/dressing/bathroom;A little help with walking and/or transfers   Can travel by private vehicle     Yes  Equipment Recommendations  Wheelchair cushion (measurements PT);Wheelchair (measurements PT)    Recommendations for Other  Services       Precautions / Restrictions Precautions Precautions: Fall Restrictions Weight Bearing Restrictions Per Provider Order: No     Mobility  Bed Mobility Overal bed mobility: Needs Assistance Bed Mobility: Supine to Sit     Supine to sit: Contact guard     General bed mobility comments: increased time with cues, HOB elevated    Transfers Overall transfer level: Needs assistance Equipment used: Rolling walker (2 wheels) Transfers: Sit to/from Stand Sit to Stand: Contact guard assist           General transfer comment: CGA from elevated EOB, CGA and cues for commode transfer with BSC placed over standard commode    Ambulation/Gait Ambulation/Gait assistance: Contact guard assist Gait Distance (Feet): 20 Feet Assistive device: Rolling walker (2 wheels) Gait Pattern/deviations: Step-to pattern, Decreased weight shift to left, Trunk flexed, Decreased stance time - left, Antalgic Gait velocity: decreased     General Gait Details: short choppy step to pattern with pt self limting L LE stance time, use of RW with min cues for posture, safety with approach to sitting surfaces and pt able to perform good recall for proper UE placement   Stairs             Wheelchair Mobility     Tilt Bed    Modified Rankin (Stroke Patients Only)       Balance Overall balance assessment: Needs assistance Sitting-balance support: No upper extremity supported, Feet supported Sitting balance-Leahy Scale: Good     Standing balance support: Bilateral upper extremity supported, Reliant on assistive device for balance Standing balance-Leahy Scale: Poor Standing balance  comment: B UE support at [facility] for dynamic tasks pt able to maintain static standing no UE support for handwashing                            Communication Communication Communication: Impaired Factors Affecting Communication: Hearing impaired  Cognition Arousal: Alert Behavior During  Therapy: WFL for tasks assessed/performed   PT - Cognitive impairments: History of cognitive impairments                       PT - Cognition Comments: pt knew date, 11/21 but not day of the week Following commands: Intact      Cueing Cueing Techniques: Verbal cues, Tactile cues, Gestural cues, Visual cues  Exercises Total Joint Exercises Ankle Circles/Pumps: AROM, Both, 10 reps, Supine Straight Leg Raises: AROM, Left, 5 reps, Supine Long Arc Quad: AROM, Left, 10 reps, Seated Knee Flexion: AROM, Left, 10 reps, Supine Other Exercises Other Exercises: L LE PROM in sitting to faciltiate improved knee extension with poor tolerance and L LE supported at ankle with small towel roll at end of therapy session when seated in recliner with B LE elevated    General Comments General comments (skin integrity, edema, etc.): proximal to recent L surgical incision line s/p TKA with mild discoloration noted blanching purple/red color with pt reporting no pain with palpation in that area rather distal to incision line with no apparent discoloration and reported some discomfort with L lateral ankle with bruse noted and posterior distal L LE proximal to ankle joint      Pertinent Vitals/Pain Pain Assessment Pain Assessment: Faces Faces Pain Scale: Hurts even more Breathing: normal Negative Vocalization: none Facial Expression: smiling or inexpressive Body Language: relaxed Consolability: no need to console PAINAD Score: 0 Pain Location: L knee Pain Descriptors / Indicators: Aching, Sore, Guarding, Grimacing Pain Intervention(s): Limited activity within patient's tolerance, Monitored during session, Repositioned (pt declined ice/CP at end of tx session)    Home Living                          Prior Function            PT Goals (current goals can now be found in the care plan section) Acute Rehab PT Goals Patient Stated Goal: SNF then home PT Goal Formulation: With  patient Time For Goal Achievement: 03/08/24 Potential to Achieve Goals: Good Progress towards PT goals: Progressing toward goals    Frequency    Min 2X/week      PT Plan      Co-evaluation              AM-PAC PT 6 Clicks Mobility   Outcome Measure  Help needed turning from your back to your side while in a flat bed without using bedrails?: A Little Help needed moving from lying on your back to sitting on the side of a flat bed without using bedrails?: A Little Help needed moving to and from a bed to a chair (including a wheelchair)?: A Little Help needed standing up from a chair using your arms (e.g., wheelchair or bedside chair)?: A Little Help needed to walk in hospital room?: A Little Help needed climbing 3-5 steps with a railing? : Total 6 Click Score: 16    End of Session Equipment Utilized During Treatment: Gait belt Activity Tolerance: Patient limited by fatigue;No increased pain Patient left: with call bell/phone  within reach;in chair;with chair alarm set Nurse Communication: Mobility status PT Visit Diagnosis: Unsteadiness on feet (R26.81);Other abnormalities of gait and mobility (R26.89);Muscle weakness (generalized) (M62.81)     Time: 8948-8885 PT Time Calculation (min) (ACUTE ONLY): 23 min  Charges:    $Gait Training: 8-22 mins $Therapeutic Activity: 8-22 mins PT General Charges $$ ACUTE PT VISIT: 1 Visit                     [place], PT Acute Rehab    [place] VEAR Drone 02/29/2024, 1:37 PM

## 2024-02-29 NOTE — TOC Transition Note (Signed)
 Transition of Care [facility]) - Discharge Note  Patient Details  Name: Gina [name] MRN: [id number] Date of Birth: 1933/03/28  Transition of [facility]) CM/SW Contact:  Duwaine GORMAN Aran, LCSW Phone Number: 02/29/2024, 2:26 PM  Clinical Narrative: Patient has been approved for [facility]. Patient will go to room 401 and the number for report is [phone]. Discharge summary, discharge orders, SNF transfer report faxed to facility in hub. Medical necessity form done; PTAR scheduled. Discharge packet completed. Daughter and RN updated. Care management.  Final next level of care: Skilled Nursing Facility Barriers to Discharge: Barriers Resolved  Patient Goals and CMS Choice Patient states their goals for this hospitalization and ongoing recovery are:: Rehab CMS Medicare.gov Compare Post Acute Care list provided to:: Patient Represenative (must comment) ([name](dtr)) Choice offered to / list presented to : Adult Children [place] ownership interest in [facility].provided to:: Adult Children   Discharge Placement Existing PASRR number confirmed : 02/24/24          Patient chooses bed at: [facility] Patient to be transferred to facility by: PTAR Name of family member notified: [name] No (daughter) Patient and family notified of of transfer: 02/29/24  Discharge Plan and Services Additional resources added to the After Visit Summary for   In-house Referral: Clinical Social Work Discharge Planning Services: CM Consult Post Acute Care Choice: Skilled Nursing Facility          DME Arranged: N/A DME Agency: NA HH Arranged: NA HH Agency: NA  Social Drivers of Health (SDOH) Interventions SDOH Screenings   Food Insecurity: No Food Insecurity (02/23/2024)  Housing: Low Risk  (02/23/2024)  Transportation Needs: No Transportation Needs (02/23/2024)  Utilities: Not At Risk (02/23/2024)  Financial Resource Strain: Low Risk  (07/29/2019)   Received from [facility] visits prior to 06/10/2022.  Physical Activity: Sufficiently Active (07/29/2019)   Received from [facility] visits prior to 06/10/2022.  Social Connections: Moderately Integrated (02/23/2024)  Stress: No Stress Concern Present (07/29/2019)   Received from [facility] visits prior to 06/10/2022.  Tobacco Use: Low Risk  (02/22/2024)   Readmission Risk Interventions     No data to display

## 2024-03-03 ENCOUNTER — Encounter: Payer: Self-pay | Admitting: Medical Oncology

## 2024-03-03 ENCOUNTER — Other Ambulatory Visit (HOSPITAL_COMMUNITY): Payer: Self-pay | Admitting: Urology

## 2024-03-03 DIAGNOSIS — N2889 Other specified disorders of kidney and ureter: Secondary | ICD-10-CM

## 2024-03-03 NOTE — Progress Notes (Unsigned)
 Johann Sieving, MD  Baldwin Rosella L PROCEDURE / BIOPSY REVIEW Date: 03/03/24  Requested Biopsy site: LLP renal mass Reason for request: tumor Imaging review: Best seen on CT 02/14/24  Decision: Approved Imaging modality to perform: CT Schedule with: Moderate Sedation Schedule for: Any VIR  Additional comments:   Please contact me with questions, concerns, or if issue pertaining to this request arise.  Dayne Sieving Johann, MD [facility] Radiology       Previous Messages    ----- Message ----- From: Baldwin Rosella CROME Sent: 03/03/2024  10:00 AM EST To: Rosella CROME Baldwin; Taryn F Rigney, RT; Ir Proc* Subject: US  BIOPSY (KIDNEY)                            Procedure :US  BIOPSY (KIDNEY)  Reason :Mass on left kidney Dx: Renal mass [N28.89 (ICD-10-CM)]    History :DG Knee Complete 4 Views Left ,CT ABDOMEN PELVIS W CONTRAST ,CT Angio Chest PE W and/or Wo Contrast,DG Chest Portable 1 View,DG Knee Complete 4 Views Left ,DG Hip Unilat W or Wo Pelvis 2-3 Views Right,CT Cervical Spine Wo Contrast,CT Head Wo Contrast  Provider:Showalter, Steffan BROCKS, MD  Provider contact ; [phone]

## 2024-03-11 DIAGNOSIS — R410 Disorientation, unspecified: Secondary | ICD-10-CM | POA: Diagnosis not present

## 2024-03-11 DIAGNOSIS — R350 Frequency of micturition: Secondary | ICD-10-CM | POA: Diagnosis not present

## 2024-03-12 ENCOUNTER — Inpatient Hospital Stay: Attending: Physician Assistant | Admitting: Physician Assistant

## 2024-03-12 ENCOUNTER — Inpatient Hospital Stay

## 2024-03-12 VITALS — BP 129/87 | HR 90 | Temp 97.3°F | Resp 16

## 2024-03-12 DIAGNOSIS — N2889 Other specified disorders of kidney and ureter: Secondary | ICD-10-CM

## 2024-03-12 LAB — CBC WITH DIFFERENTIAL (CANCER CENTER ONLY)
Abs Immature Granulocytes: 0.02 K/uL (ref 0.00–0.07)
Basophils Absolute: 0.1 K/uL (ref 0.0–0.1)
Basophils Relative: 1 %
Eosinophils Absolute: 0.3 K/uL (ref 0.0–0.5)
Eosinophils Relative: 4 %
HCT: 43.1 % (ref 36.0–46.0)
Hemoglobin: 14.8 g/dL (ref 12.0–15.0)
Immature Granulocytes: 0 %
Lymphocytes Relative: 9 %
Lymphs Abs: 0.9 K/uL (ref 0.7–4.0)
MCH: 32.7 pg (ref 26.0–34.0)
MCHC: 34.3 g/dL (ref 30.0–36.0)
MCV: 95.4 fL (ref 80.0–100.0)
Monocytes Absolute: 0.9 K/uL (ref 0.1–1.0)
Monocytes Relative: 9 %
Neutro Abs: 7.4 K/uL (ref 1.7–7.7)
Neutrophils Relative %: 77 %
Platelet Count: 272 K/uL (ref 150–400)
RBC: 4.52 MIL/uL (ref 3.87–5.11)
RDW: 12.7 % (ref 11.5–15.5)
WBC Count: 9.7 K/uL (ref 4.0–10.5)
nRBC: 0 % (ref 0.0–0.2)

## 2024-03-12 LAB — CMP (CANCER CENTER ONLY)
ALT: 8 U/L (ref 0–44)
AST: 23 U/L (ref 15–41)
Albumin: 4 g/dL (ref 3.5–5.0)
Alkaline Phosphatase: 108 U/L (ref 38–126)
Anion gap: 11 (ref 5–15)
BUN: 27 mg/dL — ABNORMAL HIGH (ref 8–23)
CO2: 22 mmol/L (ref 22–32)
Calcium: 10.6 mg/dL — ABNORMAL HIGH (ref 8.9–10.3)
Chloride: 104 mmol/L (ref 98–111)
Creatinine: 0.87 mg/dL (ref 0.44–1.00)
GFR, Estimated: >60 mL/min (ref >60)
Glucose, Bld: 125 mg/dL — ABNORMAL HIGH (ref 70–99)
Potassium: 4.3 mmol/L (ref 3.5–5.1)
Sodium: 137 mmol/L (ref 135–145)
Total Bilirubin: 0.5 mg/dL (ref 0.0–1.2)
Total Protein: 7.1 g/dL (ref 6.5–8.1)

## 2024-03-12 NOTE — Patient Instructions (Addendum)
 Rapid Diagnostic Services Office Visit Discharge Information and Instructions  Thank you for choosing [facility] for your healthcare needs.  Below is a summary of today's discussion, along with our contact information and an outline of what to expect next.  Reason for Visit:  renal mass  Proposed Diagnostic Care Plan: Labs collected today We will schedule a follow up appointment with Dr. Federico in 3 months. I have also ordered a CT scan of her abdomen to happen in 3 months. Please call the Central scheduling department in early March to schedule the scan.  The phone number is [phone].  You will be contacted once your appointment with Dr. Federico has been scheduled as well.   What to Expect: - Generally, when lab tests are ordered the results can take up to 1 week for results to be available.  At that point, we will contact you to discuss your results with you.  Unless there is a critical result, we will typically wait for all of your lab results to be available before contacting you. - If a biopsy is part of your Care Plan, those results can take on average 7-10 days to result.  Once results are available, we will contact you to discuss your pathology results and any next steps. - If you have additional imaging ordered, such as a CT Scan, MRI, Ultrasound, Bone Scan, or PET scan, your imaging will need to be authorized then scheduled with the earliest available appointment.  You may be asked to travel to another hospital within [facility] [place] who has a sooner availability, please consider doing so if asked. - If you use MyChart, your results will be available to you in the MyChart portal.  Your provider will be in touch with you as soon as all of your results are available to be discussed.  Your Diagnostic Clinic Provider:  Saqib Cazarez Walisiewicz PA-C and Dr. Federico Your Diagnostic Navigator:  Colene Raider RN, office number [phone]  If you or your caregiver have number blocking on your  cell phones, please ensure the cancer center's numbers are not blocked.  If you are not a registered MyChart user, please consider enrolling in MyChart to receive your test results and visit notes.  You can also access your discharge instructions electronically.  MyChart also gives you an electronic means to communicate with your Care Team instead of needing to call in to the cancer center.  We appreciate you trusting us  with your healthcare and look forward to partnering with you as we work to uncover what your potential diagnosis may be.  Please do not hesitate to reach out at any point with questions or concerns.[name]

## 2024-03-12 NOTE — Progress Notes (Signed)
 [facility] [phone]   [phone]  INITIAL CONSULTATION:  Patient Care Team: Caleen Dirks, MD as PCP - General (Internal Medicine) Golden Forestine BROCKS, RN as Oncology Nurse Navigator (Medical Oncology)  CHIEF COMPLAINTS/PURPOSE OF CONSULTATION:  Renal mass  HISTORY OF PRESENTING ILLNESS:  Gina [name] 89 y.o. female with medical history significant for hypertension, GERD, osteoarthritis, chronic kidney disease stage 3, recent left total knee replacement.  On review of the previous records patient is being referred for a left renal mass. Patient has had recent multiple hospitalizations and during ED work up and on 02/14/24 had CTA chest with findings that included a 4.7 cm enhancing mass in the lower pole of the left kidney highly suspicious for renal cell carcinoma. There was noted to be tumor thrombus extending into an inferior branch of the left renal vein. 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 Radiologist added follow up as appropriate given age and comorbidities.  UA collected on 02/22/2024 was negative for blood. Patient had initial consult with urology Dr. Shane on 02/22/24. Per office note the left renal mass is not amendable to partial nephrectomy as it has invasion in the left segmental renal vein. During visit it was also discussed patient's overall fitness would be a barrier to surgery and patient sated she does not want surgery. Family is interested in a renal mass biopsy to see if this is benign vs aggressive. CT renal biopsy has been scheduled for 04/08/24 with plan for follow up in 4 months (April/May 2026) with a CT scan. Urologist also comments that in regard to the pancreatic mass the prognosis is unclear, and imaging was sent to hepatobiliary surgeon for evaluation.  On exam today patient is accompanied  by her daughter who provides majority of history.  Daughter states that patient is currently in a skilled nursing facility at [facility] due to her weakness after recent knee replacement.  She states she has been there for little over 2 weeks.  Patient denies abdominal or back pain.  Denies any burning with urination or gross hematuria.  Patient states she has no complaints at this time. Daughter reports patient has experienced falls, confusion, and UTIs in the past, and her blood pressure has been running low recently. She has had benign tumors in the past, including one at her jugular when she was younger. She has not had a colonoscopy or routine mammograms in many years, and there is no history of abnormal Pap smears. Her family history is notable for her father, who passed away from leukemia in 55. There is no personal history of cancer, nor any other known family history of cancer. She has never smoked or consumed alcohol . She worked in radio broadcast assistant, often in environments with toner dust.  MEDICAL HISTORY:  Past Medical History:  Diagnosis Date   CKD (chronic kidney disease) stage 3, GFR 30-59 ml/min (HCC)    GERD (gastroesophageal reflux disease)    Hypertension    Paroxysmal SVT (supraventricular tachycardia) 02/15/2024   Pneumonia     SURGICAL HISTORY: Past Surgical History:  Procedure Laterality Date   FOOT SURGERY     Partial removal right great toe   Right knee surgery     SHOULDER SURGERY Right    TOTAL KNEE ARTHROPLASTY Left 12/31/2023   Procedure: ARTHROPLASTY, KNEE, TOTAL;  Surgeon: Melodi Lerner, MD;  Location: [facility];  Service: Orthopedics;  Laterality: Left;    SOCIAL HISTORY: Social History   Socioeconomic History   Marital status: Divorced    Spouse name: Not on file   Number of children: Not on file   Years of education: Not on file   Highest education level: Not on file  Occupational History   Not on file  Tobacco Use    Smoking status: Never   Smokeless tobacco: Never  Vaping Use   Vaping status: Never Used  Substance and Sexual Activity   Alcohol  use: Never   Drug use: Never   Sexual activity: Not on file  Other Topics Concern   Not on file  Social History Narrative   Not on file   Social Drivers of Health   Tobacco Use: Low Risk (02/22/2024)   Patient History    Smoking Tobacco Use: Never    Smokeless Tobacco Use: Never    Passive Exposure: Not on file  Financial Resource Strain: Not on file  Food Insecurity: No Food Insecurity (02/23/2024)   Epic    Worried About Programme Researcher, Broadcasting/film/video in the Last Year: Never true    Ran Out of Food in the Last Year: Never true  Transportation Needs: No Transportation Needs (02/23/2024)   Epic    Lack of Transportation (Medical): No    Lack of Transportation (Non-Medical): No  Physical Activity: Not on file  Stress: Not on file  Social Connections: Moderately Integrated (02/23/2024)   Social Connection and Isolation Panel    Frequency of Communication with Friends and Family: Twice a week    Frequency of Social Gatherings with Friends and Family: Twice a week    Attends Religious Services: 1 to 4 times per year    Active Member of [employer] or Organizations: Yes    Attends Banker Meetings: 1 to 4 times per year    Marital Status: Divorced  Intimate Partner Violence: Not At Risk (02/23/2024)   Epic    Fear of Current or Ex-Partner: No    Emotionally Abused: No    Physically Abused: No    Sexually Abused: No  Depression (PHQ2-9): Not on file  Alcohol  Screen: Not on file  Housing: Low Risk (02/23/2024)   Epic    Unable to Pay for Housing in the Last Year: No    Number of Times Moved in the Last Year: 0    Homeless in the Last Year: No  Utilities: Not At Risk (02/23/2024)   Epic    Threatened with loss of utilities: No  Health Literacy: Not on file    FAMILY HISTORY: Family History  Problem Relation  Age of Onset   Heart disease Mother    Cataracts Mother    Lung cancer Father     ALLERGIES:  is allergic to codeine and strawberry extract.  MEDICATIONS:  Current Outpatient Medications  Medication Sig Dispense Refill   acetaminophen  (TYLENOL ) 500 MG tablet Take 1,000 mg by mouth every 8 (eight) hours as needed for moderate pain (pain score 4-6).     Amino Acids-Protein Hydrolys (PRO-STAT PO) Take 30 mLs by mouth daily.     aspirin  EC 81 MG tablet Take 81 mg by mouth daily.     carboxymethylcellulose (REFRESH PLUS) 0.5 % SOLN Place 1 drop into both eyes daily.     collagenase  (SANTYL ) 250 UNIT/GM ointment Apply topically daily. Apply 1/4 thick layer of Santyl  to wound bed daily, using a Q tip applicator insert a small piece of  saline moistened gauze (see wound care order)     divalproex  (DEPAKOTE ) 125 MG DR tablet Take 125 mg by mouth every evening.     omeprazole (PRILOSEC) 20 MG capsule Take 20 mg by mouth daily.     traMADol  (ULTRAM ) 50 MG tablet Take 1-2 tablets (50-100 mg total) by mouth every 8 (eight) hours as needed for moderate pain (pain score 4-6). 15 tablet 0   No current facility-administered medications for this visit.    REVIEW OF SYSTEMS:   All other systems are reviewed and are negative for acute change except as noted in the HPI.  PHYSICAL EXAMINATION: ECOG PERFORMANCE STATUS: 2 - Symptomatic, <50% confined to bed  Vitals:   03/12/24 1415  BP: 129/87  Pulse: 90  Resp: 16  Temp: (!) 97.3 F (36.3 C)  SpO2: 96%   There were no vitals filed for this visit.  Physical Exam Vitals reviewed.  Constitutional:      Appearance: She is not ill-appearing or toxic-appearing.  HENT:     Head: Normocephalic.     Nose: Nose normal.  Eyes:     General: No scleral icterus.    Conjunctiva/sclera: Conjunctivae normal.  Cardiovascular:     Rate and Rhythm: Normal rate.     Pulses: Normal pulses.  Pulmonary:     Effort: Pulmonary effort is normal.   Abdominal:     General: There is no distension.     Palpations: Abdomen is soft.     Tenderness: There is no abdominal tenderness. There is no right CVA tenderness, left CVA tenderness, guarding or rebound.  Musculoskeletal:        General: Normal range of motion.     Cervical back: Normal range of motion.  Skin:    General: Skin is warm and dry.  Neurological:     Mental Status: She is alert and oriented to person, place, and time.       LABORATORY DATA:  I have reviewed the data as listed    Latest Ref Rng & Units 03/12/2024    3:49 PM 02/22/2024    8:12 PM 02/15/2024    5:27 AM  CBC  WBC 4.0 - 10.5 K/uL 9.7  9.3  17.1   Hemoglobin 12.0 - 15.0 g/dL 85.1  86.8  88.4   Hematocrit 36.0 - 46.0 % 43.1  38.5  36.1   Platelets 150 - 400 K/uL 272  260  168        Latest Ref Rng & Units 03/12/2024    3:49 PM 02/28/2024    6:02 AM 02/24/2024    5:24 AM  CMP  Glucose 70 - 99 mg/dL 874  99  73   BUN 8 - 23 mg/dL 27  10  16    Creatinine 0.44 - 1.00 mg/dL 9.12  9.19  9.18   Sodium 135 - 145 mmol/L 137  138  137   Potassium 3.5 - 5.1 mmol/L 4.3  3.8  3.9   Chloride 98 - 111 mmol/L 104  103  104   CO2 22 - 32 mmol/L 22  27  25    Calcium 8.9 - 10.3 mg/dL 89.3  89.4  9.4   Total Protein 6.5 - 8.1 g/dL 7.1     Total Bilirubin 0.0 - 1.2 mg/dL 0.5     Alkaline Phos 38 - 126 U/L 108     AST 15 - 41 U/L 23     ALT 0 - 44 U/L 8  RADIOGRAPHIC STUDIES: I have personally reviewed the radiological images as listed and agreed with the findings in the report. DG Knee Complete 4 Views Left Result Date: 02/22/2024 EXAM: 4 VIEW(S) XRAY OF THE LEFT KNEE 02/22/2024 04:51:00 PM COMPARISON: 10/15/2023 CLINICAL HISTORY: fall FINDINGS: BONES AND JOINTS: Interval left total knee arthroplasty is in place. Small effusion in the suprapatellar bursa. No acute fracture. No focal osseous lesion. No joint dislocation. SOFT TISSUES: Femoropopliteal arterial calcifications. The soft tissues are  otherwise unremarkable. IMPRESSION: 1. Interval left total knee arthroplasty in expected position. Electronically signed by: Morgane Naveau MD 02/22/2024 05:02 PM EST RP Workstation: [id number]    ASSESSMENT & PLAN Gina [name] is a 89 y.o. female presenting to [facility] for consultation regarding renal mass. Patient will proceed with laboratory workup today.   #Renal mass - Reviewed ED work up and urology recommendations with patient and daughter. CT AP also showing Multicystic lesion in the pancreatic body with mural calcification measures 4.1 x 3.9 cm. Discussed the cystic and calcified description is more likely indicative of this being benign. - Patient is scheduled for CT renal biopsy on 04/08/24 however after further discussion patient and daughter due not wish to pursue biopsy at this time. They are agreeable for monitoring with a CT AP in 3 months and a follow up visit with Dr. Federico. We will notify urology team of this change.   #Age related screenings - N/A because of age   Patient expressed understanding of the recommended workup and is agreeable to move forward.   All questions were answered. The patient knows to call the clinic with any problems, questions or concerns.  Shared visit with Dr. Federico.  Orders Placed This Encounter  Procedures   CT ABDOMEN PELVIS W CONTRAST    Expected in 3 months (March/2026)    Standing Status:   Future    Expected Date:   06/10/2024    Expiration Date:   03/12/2025    Scheduling Instructions:     Expected in 3 months (March/2026)    If indicated for the ordered procedure, I authorize the administration of contrast media per Radiology protocol:   Yes    Does the patient have a contrast media/X-ray dye allergy?:   No    Preferred imaging location?:   [facility]    If indicated for the ordered procedure, I authorize the administration of oral contrast media per Radiology protocol:   Yes   CBC with Differential  (Cancer Center Only)    Standing Status:   Future    Number of Occurrences:   1    Expiration Date:   03/12/2025   CMP (Cancer Center only)    Standing Status:   Future    Number of Occurrences:   1    Expiration Date:   03/12/2025      I have spent a total of 60 minutes minutes of face-to-face and non-face-to-face time, preparing to see the patient, obtaining and/or reviewing separately obtained history, performing a medically appropriate examination, counseling and educating the patient, ordering medications/tests/procedures, referring and communicating with other health care professionals, documenting clinical information in the electronic health record, independently interpreting results and communicating results to the patient, and care coordination.   Todd Jelinski Walisiewicz PA-C Department of Hematology/Oncology [facility] at [facility] Phone: [phone]  I have read the above note and personally examined the patient. I agree with the assessment and plan as noted above.  Briefly Mrs. Cornell  is an 89 year old female who presents for evaluation of a newly diagnosed renal mass.  Patient underwent a CT abdomen pelvis on 02/14/2024 which showed a 4.7 cm enhancing mass in the lower pole of the left kidney, highly suspicious for RCC.  Urology was consulted and considered a CT renal biopsy in order to evaluate this further.  Today we discussed the findings and plan moving forward.  We discussed that the best curative measure for lesion like this would be a resection, and if she was not a candidate then our interventions would be palliative in nature.  Additionally we discussed that a biopsy has a potential of seeding and metastasizing a renal cell carcinoma.  We discussed what observation and comfort based care would look like versus treatment with immunotherapy.  After discussion of the risks and benefits of the different options the patient and her daughter agreed to doing  a repeat CT scan in 3 months time to reevaluate and canceling the biopsy.  She voiced understanding of the risks and benefits and felt this was the most appropriate path forward.  We will plan to see her back in 3 months time with the repeat scan.    Norleen IVAR Kidney, MD Department of Hematology/Oncology [facility] at [facility] Phone: [phone] Pager: [phone] Email: [email or web address]

## 2024-03-13 ENCOUNTER — Encounter: Payer: Self-pay | Admitting: Physician Assistant

## 2024-03-13 ENCOUNTER — Encounter: Payer: Self-pay | Admitting: Medical Oncology

## 2024-03-13 NOTE — Progress Notes (Signed)
 [facility]   Call to [facility], per [facility], to inform office of patient and daughter's request to cancel biopsy on 04/08/24. I spoke with Powell in triage and informed her of request. Powell states will provide information to Dr. Shane.  Office thanked, encouraged to call me with questions.   Colene KYM Raider, RN, BSN Oncology Nurse Navigator, [facility] 03/13/2024 10:21 AM

## 2024-03-14 ENCOUNTER — Encounter: Payer: Self-pay | Admitting: Medical Oncology

## 2024-03-14 NOTE — Progress Notes (Signed)
 [facility]  Patient's daughter, Mrs. Geralene, requested patient's most recent cancer center lab results to be faxed to [facility]. Lab reports faxed to [phone], to the attention of Ell[place]ise, RN Supervision.   Confirmation of receipt, received.   Colene KYM Raider, RN, BSN Oncology Nurse Navigator, [facility] 03/14/2024 1:56 PM

## 2024-03-27 ENCOUNTER — Encounter: Payer: Self-pay | Admitting: Physician Assistant

## 2024-03-28 ENCOUNTER — Encounter: Payer: Self-pay | Admitting: Medical Oncology

## 2024-04-08 ENCOUNTER — Encounter (HOSPITAL_COMMUNITY): Payer: Self-pay

## 2024-04-08 ENCOUNTER — Ambulatory Visit (HOSPITAL_COMMUNITY)

## 2024-04-09 ENCOUNTER — Encounter (HOSPITAL_COMMUNITY): Payer: Self-pay | Admitting: Emergency Medicine

## 2024-04-09 ENCOUNTER — Emergency Department (HOSPITAL_COMMUNITY)

## 2024-04-09 ENCOUNTER — Observation Stay (HOSPITAL_COMMUNITY)
Admission: EM | Admit: 2024-04-09 | Discharge: 2024-04-16 | Disposition: A | Discharge status: Home / Self Care | Attending: Internal Medicine | Admitting: Internal Medicine

## 2024-04-09 ENCOUNTER — Other Ambulatory Visit: Payer: Self-pay

## 2024-04-09 DIAGNOSIS — G9341 Metabolic encephalopathy: Secondary | ICD-10-CM | POA: Diagnosis not present

## 2024-04-09 DIAGNOSIS — Z96652 Presence of left artificial knee joint: Secondary | ICD-10-CM | POA: Insufficient documentation

## 2024-04-09 DIAGNOSIS — N390 Urinary tract infection, site not specified: Secondary | ICD-10-CM | POA: Insufficient documentation

## 2024-04-09 DIAGNOSIS — K219 Gastro-esophageal reflux disease without esophagitis: Secondary | ICD-10-CM | POA: Diagnosis not present

## 2024-04-09 DIAGNOSIS — R410 Disorientation, unspecified: Secondary | ICD-10-CM | POA: Diagnosis not present

## 2024-04-09 DIAGNOSIS — N2889 Other specified disorders of kidney and ureter: Secondary | ICD-10-CM | POA: Insufficient documentation

## 2024-04-09 DIAGNOSIS — R4182 Altered mental status, unspecified: Secondary | ICD-10-CM | POA: Diagnosis present

## 2024-04-09 DIAGNOSIS — Z79899 Other long term (current) drug therapy: Secondary | ICD-10-CM | POA: Diagnosis not present

## 2024-04-09 DIAGNOSIS — Z7982 Long term (current) use of aspirin: Secondary | ICD-10-CM | POA: Insufficient documentation

## 2024-04-09 DIAGNOSIS — B952 Enterococcus as the cause of diseases classified elsewhere: Secondary | ICD-10-CM | POA: Insufficient documentation

## 2024-04-09 DIAGNOSIS — I129 Hypertensive chronic kidney disease with stage 1 through stage 4 chronic kidney disease, or unspecified chronic kidney disease: Secondary | ICD-10-CM | POA: Insufficient documentation

## 2024-04-09 DIAGNOSIS — N183 Chronic kidney disease, stage 3 unspecified: Secondary | ICD-10-CM | POA: Diagnosis not present

## 2024-04-09 DIAGNOSIS — R509 Fever, unspecified: Secondary | ICD-10-CM | POA: Insufficient documentation

## 2024-04-09 DIAGNOSIS — N3000 Acute cystitis without hematuria: Principal | ICD-10-CM

## 2024-04-09 LAB — CBC WITH DIFFERENTIAL/PLATELET
Abs Immature Granulocytes: 0.01 K/uL (ref 0.00–0.07)
Basophils Absolute: 0 K/uL (ref 0.0–0.1)
Basophils Relative: 0 %
Eosinophils Absolute: 0.2 K/uL (ref 0.0–0.5)
Eosinophils Relative: 2 %
HCT: 41.4 % (ref 36.0–46.0)
Hemoglobin: 14 g/dL (ref 12.0–15.0)
Immature Granulocytes: 0 %
Lymphocytes Relative: 10 %
Lymphs Abs: 0.8 K/uL (ref 0.7–4.0)
MCH: 32.8 pg (ref 26.0–34.0)
MCHC: 33.8 g/dL (ref 30.0–36.0)
MCV: 97 fL (ref 80.0–100.0)
Monocytes Absolute: 1 K/uL (ref 0.1–1.0)
Monocytes Relative: 12 %
Neutro Abs: 6.1 K/uL (ref 1.7–7.7)
Neutrophils Relative %: 76 %
Platelets: 224 K/uL (ref 150–400)
RBC: 4.27 MIL/uL (ref 3.87–5.11)
RDW: 12.1 % (ref 11.5–15.5)
WBC: 8 K/uL (ref 4.0–10.5)
nRBC: 0 % (ref 0.0–0.2)

## 2024-04-09 LAB — I-STAT CG4 LACTIC ACID, ED
Lactic Acid, Venous: 1.3 mmol/L (ref 0.5–1.9)
Lactic Acid, Venous: 2.1 mmol/L (ref 0.5–1.9)
Lactic Acid, Venous: 5.7 mmol/L (ref 0.5–1.9)

## 2024-04-09 LAB — COMPREHENSIVE METABOLIC PANEL WITH GFR
ALT: 7 U/L (ref 0–44)
AST: 27 U/L (ref 15–41)
Albumin: 3.6 g/dL (ref 3.5–5.0)
Alkaline Phosphatase: 95 U/L (ref 38–126)
Anion gap: 9 (ref 5–15)
BUN: 25 mg/dL — ABNORMAL HIGH (ref 8–23)
CO2: 24 mmol/L (ref 22–32)
Calcium: 10.1 mg/dL (ref 8.9–10.3)
Chloride: 102 mmol/L (ref 98–111)
Creatinine, Ser: 0.74 mg/dL (ref 0.44–1.00)
GFR, Estimated: >60 mL/min
Glucose, Bld: 90 mg/dL (ref 70–99)
Potassium: 4.3 mmol/L (ref 3.5–5.1)
Sodium: 135 mmol/L (ref 135–145)
Total Bilirubin: 0.8 mg/dL (ref 0.0–1.2)
Total Protein: 6.7 g/dL (ref 6.5–8.1)

## 2024-04-09 LAB — RESP PANEL BY RT-PCR (RSV, FLU A&B, COVID)  RVPGX2
Influenza A by PCR: NEGATIVE
Influenza B by PCR: NEGATIVE
Resp Syncytial Virus by PCR: NEGATIVE
SARS Coronavirus 2 by RT PCR: NEGATIVE

## 2024-04-09 LAB — VALPROIC ACID LEVEL: Valproic Acid Lvl: <10 ug/mL — ABNORMAL LOW (ref 50–100)

## 2024-04-09 LAB — PROTIME-INR
INR: 1.1 (ref 0.8–1.2)
Prothrombin Time: 15 s (ref 11.4–15.2)

## 2024-04-09 MED ORDER — SODIUM CHLORIDE 0.9 % IV SOLN
2.0000 g | Freq: Once | INTRAVENOUS | Status: AC
  Administered 2024-04-09: 2 g via INTRAVENOUS
  Filled 2024-04-09: qty 12.5

## 2024-04-09 MED ORDER — LACTATED RINGERS IV BOLUS (SEPSIS)
1000.0000 mL | Freq: Once | INTRAVENOUS | Status: AC
  Administered 2024-04-09: 1000 mL via INTRAVENOUS

## 2024-04-09 MED ORDER — TRAMADOL HCL 50 MG PO TABS
50.0000 mg | ORAL_TABLET | Freq: Once | ORAL | Status: AC
  Administered 2024-04-09: 50 mg via ORAL
  Filled 2024-04-09: qty 1

## 2024-04-09 MED ORDER — LACTATED RINGERS IV BOLUS (SEPSIS): 1250.0000 mL | Freq: Once | INTRAVENOUS | Status: DC

## 2024-04-09 MED ORDER — LACTATED RINGERS IV SOLN: INTRAVENOUS | Status: DC

## 2024-04-09 NOTE — ED Triage Notes (Signed)
 Pt arrives w/ [facility] from home w/ c/o confusion & dark colored urine x a few days. Hx UTI.  Confused at baseline.  VSS  18G LFA  LR given en route.

## 2024-04-09 NOTE — ED Provider Notes (Signed)
 " Gina [name] Provider Note   CSN: [id number] Arrival date & time: 04/09/24  2014     Patient presents with: Dysuria   Gina [name] is a 89 y.o. female history of recurrent UTI, CKD here presenting with altered mental status and possible UTI.  Patient lives at home with daughter.  Patient was noted to have worsening confusion and foul-smelling urine.  Patient was admitted in November of this year for UTI and encephalopathy.  Patient is confused and unable to give me much history.  Per the daughter, patient apparently thought that her glasses was the lawnmower and tried to mow the lawn with her glasses.  Patient also had some strange behaviors and very forgetful.  Finally patient did have a fall and landed on the left side several weeks ago and has persistent left shoulder pain and left knee pain.   The history is provided by the patient and a relative.       Prior to Admission medications  Medication Sig Start Date End Date Taking? Authorizing Provider  acetaminophen  (TYLENOL ) 500 MG tablet Take 1,000 mg by mouth every 8 (eight) hours as needed for moderate pain (pain score 4-6).    [provider]  Amino Acids-Protein Hydrolys (PRO-STAT PO) Take 30 mLs by mouth daily.    [provider]  aspirin  EC 81 MG tablet Take 81 mg by mouth daily.    [provider]  carboxymethylcellulose (REFRESH PLUS) 0.5 % SOLN Place 1 drop into both eyes daily.    [provider]  collagenase  (SANTYL ) 250 UNIT/GM ointment Apply topically daily. Apply 1/4 thick layer of Santyl  to wound bed daily, using a Q tip applicator insert a small piece of saline moistened gauze (see wound care order) 03/01/24   Sebastian Toribio GAILS, MD  divalproex  (DEPAKOTE ) 125 MG DR tablet Take 125 mg by mouth every evening.    [provider]  omeprazole (PRILOSEC) 20 MG capsule Take 20 mg by mouth daily. 04/05/20   [provider]     Allergies: Codeine and Strawberry extract    Review of Systems  Genitourinary:  Positive for dysuria.  All other systems reviewed and are negative.   Updated Vital Signs BP (!) 128/59 (BP Location: Right Arm)   Pulse 77   Temp 97.8 F (36.6 C) (Oral)   Resp 15   SpO2 97%   Physical Exam Vitals and nursing note reviewed.  Constitutional:      Comments: Confused and demented   HENT:     Head: Normocephalic.     Nose: Nose normal.     Mouth/Throat:     Mouth: Mucous membranes are moist.  Eyes:     Extraocular Movements: Extraocular movements intact.     Pupils: Pupils are equal, round, and reactive to light.  Cardiovascular:     Rate and Rhythm: Normal rate and regular rhythm.     Pulses: Normal pulses.     Heart sounds: Normal heart sounds.  Pulmonary:     Effort: Pulmonary effort is normal.     Comments: Diminished bilaterally  Abdominal:     General: Abdomen is flat.     Palpations: Abdomen is soft.  Musculoskeletal:     Cervical back: Normal range of motion and neck supple.     Comments: Stage 2 sacral decub ulcer   Skin:    General: Skin is warm.     Capillary Refill: Capillary refill takes less than 2  seconds.  Neurological:     Mental Status: She is alert.     Comments: Confused and demented  Psychiatric:        Mood and Affect: Mood normal.        Behavior: Behavior normal.     (all labs ordered are listed, but only abnormal results are displayed) Labs Reviewed  RESP PANEL BY RT-PCR (RSV, FLU A&B, COVID)  RVPGX2  CULTURE, BLOOD (ROUTINE X 2)  CULTURE, BLOOD (ROUTINE X 2)  COMPREHENSIVE METABOLIC PANEL WITH GFR  CBC WITH DIFFERENTIAL/PLATELET  PROTIME-INR  URINALYSIS, W/ REFLEX TO CULTURE (INFECTION SUSPECTED)  I-STAT CG4 LACTIC ACID, ED    EKG: EKG Interpretation Date/Time:  Wednesday April 09 2024 20:31:51 EST Ventricular Rate:  75 PR Interval:  213 QRS Duration:  102 QT Interval:  384 QTC Calculation: 429 R Axis:   -23  Text  Interpretation: Sinus rhythm Borderline prolonged PR interval Borderline left axis deviation Abnormal R-wave progression, late transition No significant change since last tracing Confirmed by Patt Alm DEL [phone]) on 04/09/2024 8:44:47 PM  Radiology: No results found.   Procedures   Medications Ordered in the ED  lactated ringers  infusion (has no administration in time range)  lactated ringers  bolus 1,000 mL (1,000 mLs Intravenous New Bag/Given 04/09/24 2049)  ceFEPIme (MAXIPIME) 2 g in sodium chloride  0.9 % 100 mL IVPB (2 g Intravenous New Bag/Given 04/09/24 2050)                                    Medical Decision Making Gina [name] is a 89 y.o. female here presenting with confusion and possible UTI.  Patient is afebrile and confused at baseline.  Will do sepsis workup with CBC CMP and lactate and culture and chest x-ray urinalysis.  Will get CT head given confusion.  Will give antibiotics for complicated UTI.  10:51 PM Lactate is mildly elevated at 2.1.  White blood cell count is normal.  CT head is unremarkable and chest x-ray showed no pneumonia.  Urinalysis still pending.  Patient received cefepime for complicated UTI   11:02 PM UA pending. Anticipate admission. Signed out to Dr. Emil   Amount and/or Complexity of Data Reviewed Labs: ordered. Radiology: ordered.  Risk Prescription drug management.     Final diagnoses:  None    ED Discharge Orders     None          Patt Alm Macho, MD 04/09/24 2302  "

## 2024-04-09 NOTE — ED Provider Notes (Signed)
 Received patient in turnover from Dr. Patt.  Please see their note for further details of Hx, PE.  Briefly patient is a 89 y.o. female with a Dysuria .  Patient also with hallucinations. Given rocephin .  Plan for admission post UA result.  Repeat lactate trending upward.  Will make the patient code sepsis.  IV fluids.  Patient had another lactate result shortly after the first 1.  I discussed this with nursing staff.  It sounds like the first tube was hemolyzed and repeat was normal.  Will cancel code sepsis.  UA has resulted and is consistent with infection.  Will discuss with hospitalist.    Emil Share, DO 04/10/24 9985

## 2024-04-10 DIAGNOSIS — G9341 Metabolic encephalopathy: Secondary | ICD-10-CM

## 2024-04-10 LAB — BASIC METABOLIC PANEL WITH GFR
Anion gap: 8 (ref 5–15)
BUN: 20 mg/dL (ref 8–23)
CO2: 22 mmol/L (ref 22–32)
Calcium: 9.8 mg/dL (ref 8.9–10.3)
Chloride: 107 mmol/L (ref 98–111)
Creatinine, Ser: 0.71 mg/dL (ref 0.44–1.00)
GFR, Estimated: >60 mL/min
Glucose, Bld: 80 mg/dL (ref 70–99)
Potassium: 4.7 mmol/L (ref 3.5–5.1)
Sodium: 137 mmol/L (ref 135–145)

## 2024-04-10 LAB — URINALYSIS, W/ REFLEX TO CULTURE (INFECTION SUSPECTED)
Bilirubin Urine: NEGATIVE
Glucose, UA: NEGATIVE mg/dL
Hgb urine dipstick: NEGATIVE
Ketones, ur: NEGATIVE mg/dL
Nitrite: POSITIVE — AB
Protein, ur: NEGATIVE mg/dL
Specific Gravity, Urine: 1.018 (ref 1.005–1.030)
WBC, UA: >50 WBC/hpf (ref 0–5)
pH: 7 (ref 5.0–8.0)

## 2024-04-10 LAB — CBC
HCT: 36.5 % (ref 36.0–46.0)
Hemoglobin: 12.2 g/dL (ref 12.0–15.0)
MCH: 32.4 pg (ref 26.0–34.0)
MCHC: 33.4 g/dL (ref 30.0–36.0)
MCV: 97.1 fL (ref 80.0–100.0)
Platelets: 163 K/uL (ref 150–400)
RBC: 3.76 MIL/uL — ABNORMAL LOW (ref 3.87–5.11)
RDW: 12 % (ref 11.5–15.5)
WBC: 6.6 K/uL (ref 4.0–10.5)
nRBC: 0 % (ref 0.0–0.2)

## 2024-04-10 MED ORDER — ACETAMINOPHEN 325 MG PO TABS
650.0000 mg | ORAL_TABLET | Freq: Four times a day (QID) | ORAL | Status: DC | PRN
  Administered 2024-04-10 – 2024-04-13 (×2): 650 mg via ORAL
  Filled 2024-04-10 (×2): qty 2

## 2024-04-10 MED ORDER — ONDANSETRON HCL 4 MG PO TABS: 4.0000 mg | ORAL_TABLET | Freq: Four times a day (QID) | ORAL | Status: DC | PRN

## 2024-04-10 MED ORDER — HALOPERIDOL 2 MG PO TABS
2.0000 mg | ORAL_TABLET | Freq: Four times a day (QID) | ORAL | Status: DC | PRN
  Administered 2024-04-10 – 2024-04-11 (×3): 2 mg via ORAL
  Filled 2024-04-10 (×5): qty 1

## 2024-04-10 MED ORDER — SODIUM CHLORIDE 0.9 % IV SOLN
1.0000 g | INTRAVENOUS | Status: DC
  Administered 2024-04-10 – 2024-04-12 (×3): 1 g via INTRAVENOUS
  Filled 2024-04-10 (×3): qty 10

## 2024-04-10 MED ORDER — ACETAMINOPHEN 650 MG RE SUPP: 650.0000 mg | Freq: Four times a day (QID) | RECTAL | Status: DC | PRN

## 2024-04-10 MED ORDER — PANTOPRAZOLE SODIUM 40 MG PO TBEC
40.0000 mg | DELAYED_RELEASE_TABLET | Freq: Every day | ORAL | Status: DC
  Administered 2024-04-10 – 2024-04-16 (×7): 40 mg via ORAL
  Filled 2024-04-10 (×7): qty 1

## 2024-04-10 MED ORDER — LACTATED RINGERS IV SOLN: INTRAVENOUS | Status: AC

## 2024-04-10 MED ORDER — ENOXAPARIN SODIUM 40 MG/0.4ML IJ SOSY
40.0000 mg | PREFILLED_SYRINGE | INTRAMUSCULAR | Status: DC
  Administered 2024-04-10 – 2024-04-16 (×7): 40 mg via SUBCUTANEOUS
  Filled 2024-04-10 (×7): qty 0.4

## 2024-04-10 MED ORDER — DIVALPROEX SODIUM 125 MG PO DR TAB
125.0000 mg | DELAYED_RELEASE_TABLET | Freq: Every evening | ORAL | Status: DC
  Administered 2024-04-10 – 2024-04-15 (×6): 125 mg via ORAL
  Filled 2024-04-10 (×7): qty 1

## 2024-04-10 MED ORDER — SENNOSIDES-DOCUSATE SODIUM 8.6-50 MG PO TABS: 1.0000 | ORAL_TABLET | Freq: Every evening | ORAL | Status: DC | PRN

## 2024-04-10 MED ORDER — DICLOFENAC SODIUM 1 % EX GEL
2.0000 g | Freq: Four times a day (QID) | CUTANEOUS | Status: DC
  Administered 2024-04-10 – 2024-04-16 (×21): 2 g via TOPICAL
  Filled 2024-04-10: qty 100

## 2024-04-10 MED ORDER — TRAMADOL HCL 50 MG PO TABS
50.0000 mg | ORAL_TABLET | Freq: Four times a day (QID) | ORAL | Status: DC | PRN
  Administered 2024-04-10 – 2024-04-16 (×5): 50 mg via ORAL
  Filled 2024-04-10 (×6): qty 1

## 2024-04-10 MED ORDER — ONDANSETRON HCL 4 MG/2ML IJ SOLN: 4.0000 mg | Freq: Four times a day (QID) | INTRAMUSCULAR | Status: DC | PRN

## 2024-04-10 NOTE — Plan of Care (Signed)

## 2024-04-10 NOTE — Progress Notes (Signed)
 Patient seen and examined . Admitted early morning hours by night time hospitalist. Patient tells me she is hurting left shoulder and hand. Also reported to be impulsive. Given tramadol  and felt better.  In brief, 89 y.o. female with medical history significant for paroxysmal SVT, GERD, left renal mass who presented to the ED for evaluation of altered mental status . She lives with her daughter and recently having difficulty with behaviors and recurrent UTI.  Initial vitals stable. Electrolytes were stable. Valproate level <10.  Urinalysis with positive nitrites, large leukocytes, 0-5 RBCs, >50 WBCs, rare bacteria.  Urine culture in process.  Blood cultures ordered. SARS-CoV-2, influenza, RSV PCR negative.   CT head without contrast negative for acute intracranial abnormality.  Stable atrophy and chronic small vessel ischemia noted.   Portable chest x-ray negative for focal consolidation, edema, effusion.   Left knee x-ray showed small suprapatellar knee joint effusion.  Left TKA with patellar resurfacing and vascular calcification noted.   Left shoulder x-ray negative for acute fracture or dislocation.  Slight inferior subluxation of the humeral head in relation to the glenoid noted.   Patient was given 1 L LR, IV cefepime, oral tramadol .  The hospitalist service was consulted for admission.  Acute metabolic encephalopathy in a patient with likely dementia Acute UTI present on admission  Continue maintenance fluids.  Continue Rocephin .  Blood cultures urine cultures pending.  Fall precautions.  Delirium precautions. She has significant pain on her left shoulder with chronic arthritic changes.  Will use tramadol , local Voltaren  gel. Can use oral haloperidol and low-dose to help with delirium prevention.   Called and discussed with patient's daughter on the phone. PT OT-refer to SNF.   Total time spent: 35 minutes.  Same-day admit with no charge visit.

## 2024-04-10 NOTE — Hospital Course (Signed)
 Gina [name] is a 89 y.o. female with medical history significant for paroxysmal SVT, GERD, left renal mass who is admitted with acute encephalopathy secondary to UTI.

## 2024-04-10 NOTE — Plan of Care (Signed)
  Problem: Clinical Measurements: Goal: Will remain free from infection Outcome: Progressing Goal: Diagnostic test results will improve Outcome: Progressing   Problem: Coping: Goal: Level of anxiety will decrease Outcome: Progressing   

## 2024-04-10 NOTE — H&P (Signed)
 " History and Physical    Gina [name] FMW:[id number] DOB: [birth year] DOA: 04/09/2024  PCP: Teresa Thersia Jansky, MD  Patient coming from: Home  I have personally briefly reviewed patient's old medical records in [facility]  Chief Complaint: Altered mental status  HPI: Gina [name] is a 89 y.o. female with medical history significant for paroxysmal SVT, GERD, left renal mass who presented to the ED for evaluation of altered mental status.  Patient unable to provide history due to altered mental status which is otherwise supplemented by EDP, chart review, and daughter by phone.  Patient currently lives with her daughter.  She has been having issues with recurrent UTI which have been associated with significant changes in mentation.  There is a question of underlying dementia although patient has not been formally evaluated.  She has had a couple recent admissions for UTI and afterwards has been discharged to SNF.  Her daughter states that patient was started on Depakote  during one of these stays at a SNF.  Reportedly he was started for issues with sleep disturbance.  Patient does not have any known history of seizures or bipolar disorder.  Daughter states that over the last 2 days she has noticed that patient has had dark and foul-smelling urine.  She has had decreased oral intake and has not been keeping up with hydration.  Over the last 24 hours she has had significantly increased confusion.  She has been talking about things which have not made sense in context.  When talking about her eyeglasses she says that she was going to use them to plow the grass.  At time of admission, patient herself could not tell me her name. She is able to tell me her birthday is [birth year] but does not know the year.  She tells me that her mother just left the room to go home rather than her daughter.  ED Course  Labs/Imaging on admission: I have personally reviewed following labs and imaging  studies.  Initial vitals showed BP 128/59, pulse 77, RR 15, temp 97.8 F, SpO2 97% on room air.  Labs showed sodium 135, potassium 4.3, bicarb 24, BUN 25, creatinine 0.74, serum glucose 90, LFTs within normal limits, WBC 8.0, hemoglobin 14.0, platelets 224.  Valproate level <10.  Urinalysis with positive nitrites, large leukocytes, 0-5 RBCs, >50 WBCs, rare bacteria.  Urine culture in process.  Blood cultures ordered and pending collection.  Initial lactic acid 2.1.  Repeat of 5.7 was in error due to hemolyzed sample.  Lactic acid was repeated again and down to 1.3.  SARS-CoV-2, influenza, RSV PCR negative.  CT head without contrast negative for acute intracranial abnormality.  Stable atrophy and chronic small vessel ischemia noted.  Portable chest x-ray negative for focal consolidation, edema, effusion.  Left knee x-ray showed small suprapatellar knee joint effusion.  Left TKA with patellar resurfacing and vascular calcification noted.  Left shoulder x-ray negative for acute fracture or dislocation.  Slight inferior subluxation of the humeral head in relation to the glenoid noted.  Patient was given 1 L LR, IV cefepime, oral tramadol .  The hospitalist service was consulted for admission.  Review of Systems:  All systems reviewed and are negative except as documented in history of present illness above.   Past Medical History:  Diagnosis Date   CKD (chronic kidney disease) stage 3, GFR 30-59 ml/min (HCC)    GERD (gastroesophageal reflux disease)    Hypertension    Paroxysmal SVT (supraventricular tachycardia) 02/15/2024  Pneumonia     Past Surgical History:  Procedure Laterality Date   FOOT SURGERY     Partial removal right great toe   Right knee surgery     SHOULDER SURGERY Right    TOTAL KNEE ARTHROPLASTY Left 12/31/2023   Procedure: ARTHROPLASTY, KNEE, TOTAL;  Surgeon: Melodi Lerner, MD;  Location: [facility];  Service: Orthopedics;  Laterality: Left;    Social  History: Social History[1]  Allergies[2]  Family History  Problem Relation Age of Onset   Heart disease Mother    Cataracts Mother    Lung cancer Father      Prior to Admission medications  Medication Sig Start Date End Date Taking? Authorizing Provider  acetaminophen  (TYLENOL ) 500 MG tablet Take 1,000 mg by mouth every 8 (eight) hours as needed for moderate pain (pain score 4-6).    [provider]  Amino Acids-Protein Hydrolys (PRO-STAT PO) Take 30 mLs by mouth daily.    [provider]  aspirin  EC 81 MG tablet Take 81 mg by mouth daily.    [provider]  carboxymethylcellulose (REFRESH PLUS) 0.5 % SOLN Place 1 drop into both eyes daily.    [provider]  collagenase  (SANTYL ) 250 UNIT/GM ointment Apply topically daily. Apply 1/4 thick layer of Santyl  to wound bed daily, using a Q tip applicator insert a small piece of saline moistened gauze (see wound care order) 03/01/24   Sebastian Toribio GAILS, MD  divalproex  (DEPAKOTE ) 125 MG DR tablet Take 125 mg by mouth every evening.    [provider]  omeprazole (PRILOSEC) 20 MG capsule Take 20 mg by mouth daily. 04/05/20   [provider]    Physical Exam: Vitals:   04/09/24 2029 04/10/24 0045  BP: (!) 128/59 (!) 114/96  Pulse: 77 77  Resp: 15 13  Temp: 97.8 F (36.6 C) 97.8 F (36.6 C)  TempSrc: Oral Oral  SpO2: 97% 100%   Constitutional: Thin elderly woman resting in bed, NAD Eyes: EOMI, lids and conjunctivae normal ENMT: Mucous membranes are dry. Posterior pharynx clear of any exudate or lesions.Normal dentition.  Neck: normal, supple, no masses. Respiratory: clear to auscultation bilaterally, no wheezing, no crackles. Normal respiratory effort. No accessory muscle use.  Cardiovascular: Regular rate and rhythm, no murmurs / rubs / gallops. No extremity edema. 2+ pedal pulses. Abdomen: no tenderness, no masses palpated. Musculoskeletal: no clubbing / cyanosis. No joint  deformity upper and lower extremities. Good ROM, no contractures. Normal muscle tone.  Skin: no rashes, lesions, ulcers. No induration Neurologic: Sensation intact. Strength 5/5 in all 4.  Psychiatric: Encephalopathic.  Awake, alert.  Cannot tell me her name, current year, current location.  Not oriented to situation.  EKG: Personally reviewed. Sinus rhythm, rate 75, first-degree AV block, no acute ischemic changes.  Premature complexes no longer present when compared to previous.  Assessment/Plan Principal Problem:   Acute metabolic encephalopathy Active Problems:   UTI (urinary tract infection)   Luv Mish is a 89 y.o. female with medical history significant for paroxysmal SVT, GERD, left renal mass who is admitted with acute encephalopathy secondary to UTI.  Assessment and Plan: Acute metabolic encephalopathy secondary to urinary tract infection: Patient with suspicion of underlying dementia admitted with encephalopathy secondary to UTI as well as dehydration from decreased oral intake.  Previous urine cultures are growing pansensitive Citrobacter and E. coli. - Continue IV ceftriaxone  - Follow urine culture - Continue IV fluid hydration overnight - Fall and delirium precautions  Depakote  use:  Per daughter, patient was started on Depakote  in October while residing at [facility].  No clear indication but was told that it was for sleep.  No known history of seizure or bipolar disorder.  Depakote  level was undetectable on admission.  Will not continue Depakote  at this time.  GERD: Continue oral PPI.  Left renal mass: Has been following with urology and oncology, patient and family have elected not to pursue biopsy and instead monitor with repeat CT scan in March.   DVT prophylaxis: enoxaparin  (LOVENOX ) injection 40 mg Start: 04/10/24 1000 Code Status:   Code Status: Do not attempt resuscitation (DNR) PRE-ARREST INTERVENTIONS DESIRED discussed with patient's daughter on  admission. Family Communication: Daughter by phone Disposition Plan: From home, dispo pending clinical progress Consults called: None Severity of Illness: The appropriate patient status for this patient is OBSERVATION. Observation status is judged to be reasonable and necessary in order to provide the required intensity of service to ensure the patient's safety. The patient's presenting symptoms, physical exam findings, and initial radiographic and laboratory data in the context of their medical condition is felt to place them at decreased risk for further clinical deterioration. Furthermore, it is anticipated that the patient will be medically stable for discharge from the hospital within 2 midnights of admission.   Jorie Blanch MD [facility]  If 7PM-7AM, please contact night-coverage [email or web address]  04/10/2024, 1:23 AM      [1]  Social History Tobacco Use   Smoking status: Never   Smokeless tobacco: Never  Vaping Use   Vaping status: Never Used  Substance Use Topics   Alcohol  use: Never   Drug use: Never  [2]  Allergies Allergen Reactions   Codeine Other (See Comments)    Insomnia    Strawberry Extract Hives   "

## 2024-04-10 NOTE — Progress Notes (Signed)
 Pt has arrived to the unit. Skin assessment complete, vitals, and call bell within reach. Pt bed is set at lowest with bed alarm on.  Pt oriented to self and poor historian. Unable to complete admission questionnaire at this time.

## 2024-04-10 NOTE — Progress Notes (Signed)
 MRSA/PCR nasal swab collected and delivered to lab per MD orders.

## 2024-04-10 NOTE — Evaluation (Signed)
 Physical Therapy Evaluation Patient Details Name: Gina [name] MRN: [id number] DOB: 1932-07-08 Today's Date: 04/10/2024  History of Present Illness  89 y.o. female   who presented to the ED for evaluation of altered mental status, admitted with UTI and metabolic encephalopathy.    PMH:  paroxysmal SVT, GERD, left renal mass  Clinical Impression  Pt admitted with above diagnosis. At baseline pt able to amb with RW, typically oriented x3; per chart pt likely with underlying dementia, not formally dx.  Pt confused, oriented only to self, knows her dtr (dtr arrived mid way through PT session). Pt requiring near constant redirection to participate with OOB/bed>chair transfers. Pt requiring min assist, multi-modal cues.  bed soaked in urine, RN aware. assisted pt with gown change and pericare. Pt would not take meds for RN, PT assisted/directed pt and she was able to take meds with RN present. Pt set up for lunch, dtr assisting pt with feeding. Patient may benefit from continued inpatient follow up therapy, <3 hours/day depending on progress; per [facility] notes plan is for pt to d/c to [facility]; will update as indicated.   Pt currently with functional limitations due to the deficits listed below (see PT Problem List). Pt will benefit from acute skilled PT to increase their independence and safety with mobility to allow discharge.           If plan is discharge home, recommend the following: A little help with walking and/or transfers;A little help with bathing/dressing/bathroom;Help with stairs or ramp for entrance;Assist for transportation;Assistance with cooking/housework   Can travel by private vehicle   Yes    Equipment Recommendations None recommended by PT  Recommendations for Other Services       Functional Status Assessment Patient has had a recent decline in their functional status and demonstrates the ability to make significant improvements in function in a reasonable and predictable  amount of time.     Precautions / Restrictions Precautions Precautions: Fall Restrictions Weight Bearing Restrictions Per Provider Order: No      Mobility  Bed Mobility Overal bed mobility: Needs Assistance Bed Mobility: Supine to Sit     Supine to sit: Min assist     General bed mobility comments: multi-modal cues for pt to self assist. incr time needed    Transfers Overall transfer level: Needs assistance Equipment used: Rolling walker (2 wheels) Transfers: Sit to/from Stand, Bed to chair/wheelchair/BSC Sit to Stand: Min assist   Step pivot transfers: Min assist       General transfer comment: assist to power up to stand, assist to steady and manage RW for stand pivot bed to chair. pt requires multi-modal cues for attention to task;    Ambulation/Gait                  Stairs            Wheelchair Mobility     Tilt Bed    Modified Rankin (Stroke Patients Only)       Balance Overall balance assessment: Needs assistance, History of Falls Sitting-balance support: Feet supported, No upper extremity supported Sitting balance-Leahy Scale: Fair     Standing balance support: Reliant on assistive device for balance, During functional activity Standing balance-Leahy Scale: Poor                               Pertinent Vitals/Pain Pain Assessment Pain Assessment: No/denies pain    Home Living  Family/patient expects to be discharged to:: Private residence Living Arrangements: Children Available Help at Discharge: Family;Available 24 hours/day Type of Home: House Home Access: Stairs to enter   [employer] of Steps: 5 Alternate Level Stairs-Number of Steps: main and upper level to home; she resides on the main level of the home Home Layout: Two level;Able to live on main level with bedroom/bathroom Home Equipment: Rolling Walker (2 wheels);Cane - single point;BSC/3in1;Tub bench Additional Comments: pt is unable to  provide info today; info regarding PLOF taken from chart review and dtr.    Prior Function               Mobility Comments: was independent up until knee sx in 12/31/23; walking with walker at home per dtr, went to rehab afer TKA       Extremity/Trunk Assessment   Upper Extremity Assessment Upper Extremity Assessment: Defer to OT evaluation    Lower Extremity Assessment Lower Extremity Assessment: Generalized weakness       Communication   Communication Communication: Impaired Factors Affecting Communication: Hearing impaired    Cognition Arousal: Alert Behavior During Therapy: Restless                           PT - Cognition Comments: per chart suspect underlying dementia, not formally dx. pt oriented only to self, knows her dtr. requires near constant redirection to participate, follow one step commands Following commands: Impaired Following commands impaired: Follows one step commands inconsistently     Cueing Cueing Techniques: Verbal cues, Gestural cues, Tactile cues     General Comments General comments (skin integrity, edema, etc.): bed soaked in urine, RN aware. assisted pt with gown change and pericare. Pt would not take meds for RN, PT assisted/directed pt and she was able to take meds with RN present. dtr present last half of session.    Exercises     Assessment/Plan    PT Assessment Patient needs continued PT services  PT Problem List Decreased strength;Decreased activity tolerance;Decreased knowledge of precautions;Decreased safety awareness;Decreased mobility;Decreased balance;Decreased cognition       PT Treatment Interventions DME instruction;Therapeutic exercise;Gait training;Functional mobility training;Therapeutic activities;Patient/family education    PT Goals (Current goals can be found in the Care Plan section)  Acute Rehab PT Goals Patient Stated Goal: rehab or transition to [facility] PT Goal Formulation: With family Time  For Goal Achievement: 04/24/24 Potential to Achieve Goals: Good    Frequency Min 3X/week     Co-evaluation               AM-PAC PT 6 Clicks Mobility  Outcome Measure Help needed turning from your back to your side while in a flat bed without using bedrails?: A Little Help needed moving from lying on your back to sitting on the side of a flat bed without using bedrails?: A Little Help needed moving to and from a bed to a chair (including a wheelchair)?: A Little Help needed standing up from a chair using your arms (e.g., wheelchair or bedside chair)?: A Little Help needed to walk in hospital room?: A Lot Help needed climbing 3-5 steps with a railing? : Total 6 Click Score: 15    End of Session   Activity Tolerance: Other (comment) (limited by cognition) Patient left: in chair;with call bell/phone within reach;with family/visitor present   PT Visit Diagnosis: Other abnormalities of gait and mobility (R26.89)    Time: 8570-8543 PT Time Calculation (min) (ACUTE ONLY): 27  min   Charges:   PT Evaluation $PT Eval Low Complexity: 1 Low PT Treatments $Therapeutic Activity: 8-22 mins PT General Charges $$ ACUTE PT VISIT: 1 Visit         Nilaya Bouie, PT  Acute Rehab Dept [facility]) [phone]  04/10/2024   [facility] 04/10/2024, 4:02 PM

## 2024-04-10 NOTE — Progress Notes (Signed)
 CSW received call from Garnette ([facility], [phone]) reporting he has been working with pts dtr on placement. A studio is being held for pt at [facility]. Dtr requesting pt dc to [facility] unless STR is required. Prior to dc, [facility] must be notified so they can assess pt for admission. Garnette can assist with coordination as needed.

## 2024-04-10 NOTE — ED Notes (Signed)
 Lactic acid of 5.7 not accurate, redraw collected.

## 2024-04-11 DIAGNOSIS — G9341 Metabolic encephalopathy: Secondary | ICD-10-CM | POA: Diagnosis not present

## 2024-04-11 NOTE — Plan of Care (Signed)

## 2024-04-11 NOTE — Progress Notes (Signed)
 " PROGRESS NOTE    Gina [name]  FMW:[id number] DOB: 1932/06/18 DOA: 04/09/2024 PCP: Teresa Thersia Jansky, MD    Brief Narrative:  89 y.o. female with medical history significant for paroxysmal SVT, GERD, left renal mass who presented to the ED for evaluation of altered mental status . She lives with her daughter and recently having difficulty with behaviors and recurrent UTI.  Found to have acute UTI.  Admitted due to significant symptoms.  Subjective: Patient seen and examined.  She looked more comfortable.  She was unhappy about being in the bed and nobody moving her around.  She looks more composed today but was still impulsive.  Today she denies any shoulder pain.  Assessment & Plan:   Acute metabolic encephalopathy in a patient with dementia Acute UTI present on admission  Clinically improving.  Urine culture growing gram-negative.  Continue Rocephin  today. No focal deficits. Delirium precautions.  Fall precautions. PT OT-refer to SNF. She does have osteoarthritic changes, can use tramadol  and Voltaren  gel.  GERD: On PPI.  Left renal mass: Recently diagnosed.  Elected not to pursue biopsy.  Surveillance recommended.        DVT prophylaxis: enoxaparin  (LOVENOX ) injection 40 mg Start: 04/10/24 1000   Code Status: DNR/DNI Family Communication: None today Disposition Plan: Status is: Observation The patient will require care spanning > 2 midnights and should be moved to inpatient because: IV antibiotics, placement     Consultants:  None  Procedures:  None  Antimicrobials:  Rocephin  12/31---     Objective: Vitals:   04/10/24 0233 04/10/24 1022 04/10/24 1947 04/11/24 0547  BP: (!) 126/56 110/74 128/72 (!) 157/83  Pulse: 77 83 93 79  Resp: 18 16 18 18   Temp: 98.2 F (36.8 C) (!) 97.5 F (36.4 C) 97.7 F (36.5 C) 98.2 F (36.8 C)  TempSrc: Oral Oral  Oral  SpO2: 97% 93% 94% 97%    Intake/Output Summary (Last 24 hours) at 04/11/2024 1404 Last data  filed at 04/11/2024 1030 Gross per 24 hour  Intake 200 ml  Output 1750 ml  Net -1550 ml   There were no vitals filed for this visit.  Examination:  General exam: Appears calm and comfortable .  Irritable on exam. Respiratory system: Clear to auscultation. Respiratory effort normal.  No added sounds. Cardiovascular system: S1 & S2 heard, RRR.  Gastrointestinal system: Soft.  Nontender.  Bowel sound present. Central nervous system: Alert and awake.  Oriented to herself, family and to the place. Extremities: Equal in all extremities.  Gross generalized weakness.    Data Reviewed: I have personally reviewed following labs and imaging studies  CBC: Recent Labs  Lab 04/09/24 2049 04/10/24 0503  WBC 8.0 6.6  NEUTROABS 6.1  --   HGB 14.0 12.2  HCT 41.4 36.5  MCV 97.0 97.1  PLT 224 163   Basic Metabolic Panel: Recent Labs  Lab 04/09/24 2049 04/10/24 0503  NA 135 137  K 4.3 4.7  CL 102 107  CO2 24 22  GLUCOSE 90 80  BUN 25* 20  CREATININE 0.74 0.71  CALCIUM 10.1 9.8   GFR: CrCl cannot be calculated (Unknown ideal weight.). Liver Function Tests: Recent Labs  Lab 04/09/24 2049  AST 27  ALT 7  ALKPHOS 95  BILITOT 0.8  PROT 6.7  ALBUMIN 3.6   No results for input(s): LIPASE, AMYLASE in the last 168 hours. No results for input(s): AMMONIA in the last 168 hours. Coagulation Profile: Recent Labs  Lab 04/09/24 2049  INR 1.1   Cardiac Enzymes: No results for input(s): CKTOTAL, CKMB, CKMBINDEX, TROPONINI in the last 168 hours. BNP (last 3 results) No results for input(s): PROBNP in the last 8760 hours. HbA1C: No results for input(s): HGBA1C in the last 72 hours. CBG: No results for input(s): GLUCAP in the last 168 hours. Lipid Profile: No results for input(s): CHOL, HDL, LDLCALC, TRIG, CHOLHDL, LDLDIRECT in the last 72 hours. Thyroid  Function Tests: No results for input(s): TSH, T4TOTAL, FREET4, T3FREE, THYROIDAB in  the last 72 hours. Anemia Panel: No results for input(s): VITAMINB12, FOLATE, FERRITIN, TIBC, IRON, RETICCTPCT in the last 72 hours. Sepsis Labs: Recent Labs  Lab 04/09/24 2125 04/09/24 2331 04/09/24 2348  LATICACIDVEN 2.1* 5.7* 1.3    Recent Results (from the past 240 hours)  Resp panel by RT-PCR (RSV, Flu A&B, Covid) Anterior Nasal Swab     Status: None   Collection Time: 04/09/24  8:49 PM   Specimen: Anterior Nasal Swab  Result Value Ref Range Status   SARS Coronavirus 2 by RT PCR NEGATIVE NEGATIVE Final    Comment: (NOTE) SARS-CoV-2 target nucleic acids are NOT DETECTED.  The SARS-CoV-2 RNA is generally detectable in upper respiratory specimens during the acute phase of infection. The lowest concentration of SARS-CoV-2 viral copies this assay can detect is 138 copies/mL. A negative result does not preclude SARS-Cov-2 infection and should not be used as the sole basis for treatment or other patient management decisions. A negative result may occur with  improper specimen collection/handling, submission of specimen other than nasopharyngeal swab, presence of viral mutation(s) within the areas targeted by this assay, and inadequate number of viral copies(<138 copies/mL). A negative result must be combined with clinical observations, patient history, and epidemiological information. The expected result is Negative.  Fact Sheet for Patients:  bloggercourse.com  Fact Sheet for Healthcare Providers:  seriousbroker.it  This test is no t yet approved or cleared by the [place]  FDA and  has been authorized for detection and/or diagnosis of SARS-CoV-2 by FDA under an Emergency Use Authorization (EUA). This EUA will remain  in effect (meaning this test can be used) for the duration of the COVID-19 declaration under Section 564(b)(1) of the Act, 21 U.S.C.section 360bbb-3(b)(1), unless the authorization is terminated   or revoked sooner.       Influenza A by PCR NEGATIVE NEGATIVE Final   Influenza B by PCR NEGATIVE NEGATIVE Final    Comment: (NOTE) The Xpert Xpress SARS-CoV-2/FLU/RSV plus assay is intended as an aid in the diagnosis of influenza from Nasopharyngeal swab specimens and should not be used as a sole basis for treatment. Nasal washings and aspirates are unacceptable for Xpert Xpress SARS-CoV-2/FLU/RSV testing.  Fact Sheet for Patients: bloggercourse.com  Fact Sheet for Healthcare Providers: seriousbroker.it  This test is not yet approved or cleared by the [place]  FDA and has been authorized for detection and/or diagnosis of SARS-CoV-2 by FDA under an Emergency Use Authorization (EUA). This EUA will remain in effect (meaning this test can be used) for the duration of the COVID-19 declaration under Section 564(b)(1) of the Act, 21 U.S.C. section 360bbb-3(b)(1), unless the authorization is terminated or revoked.     Resp Syncytial Virus by PCR NEGATIVE NEGATIVE Final    Comment: (NOTE) Fact Sheet for Patients: bloggercourse.com  Fact Sheet for Healthcare Providers: seriousbroker.it  This test is not yet approved or cleared by the [place]  FDA and has been authorized for detection and/or diagnosis of SARS-CoV-2 by FDA under an Emergency  Use Authorization (EUA). This EUA will remain in effect (meaning this test can be used) for the duration of the COVID-19 declaration under Section 564(b)(1) of the Act, 21 U.S.C. section 360bbb-3(b)(1), unless the authorization is terminated or revoked.  Performed at [facility], [address] [place], [place] [address]   Blood Culture (routine x 2)     Status: None (Preliminary result)   Collection Time: 04/09/24  8:49 PM   Specimen: BLOOD RIGHT ARM  Result Value Ref Range Status   Specimen Description    Final    BLOOD RIGHT ARM Performed at [facility], [address], [place], [place] [address]    Special Requests   Final    BOTTLES DRAWN AEROBIC AND ANAEROBIC Blood Culture results may not be optimal due to an inadequate volume of blood received in culture bottles Performed at [facility], [address] [place], [place] [address]    Culture   Final    NO GROWTH 1 DAY Performed at [facility], [address], [place], [place] [address]    Report Status PENDING  Incomplete  Urine Culture     Status: Abnormal (Preliminary result)   Collection Time: 04/09/24  9:52 PM   Specimen: Urine, Random  Result Value Ref Range Status   Specimen Description   Final    URINE, RANDOM Performed at [facility], [address], [place], [place] [address]    Special Requests   Final    NONE Reflexed from [id number] Performed at [facility], [address] [place], [place] [address]    Culture (A)  Final    >=100,000 COLONIES/mL ENTEROCOCCUS FAECALIS SUSCEPTIBILITIES TO FOLLOW Performed at [facility], [address] [place], [place] [address]    Report Status PENDING  Incomplete  Blood Culture (routine x 2)     Status: None (Preliminary result)   Collection Time: 04/10/24  5:03 AM   Specimen: BLOOD LEFT HAND  Result Value Ref Range Status   Specimen Description   Final    BLOOD LEFT HAND Performed at [facility], [address], [place], [place] [address]    Special Requests   Final    BOTTLES DRAWN AEROBIC ONLY Blood Culture results may not be optimal due to an inadequate volume of blood received in culture bottles Performed at [facility], [address], [place], [place] [address]    Culture   Final    NO GROWTH < 24 HOURS Performed at [facility], [address], [place], [place] [address]    Report Status PENDING  Incomplete         Radiology Studies: DG Shoulder  Left Result Date: 04/09/2024 EXAM: 1 VIEW(S) XRAY OF THE LEFT SHOULDER 04/09/2024 09:36:00 PM COMPARISON: None available. CLINICAL HISTORY: fall FINDINGS: BONES AND JOINTS: Imaging is limited by suboptimal positioning. Slight inferior subluxation of humeral head in relation to glenoid. Degenerative changes. No acute fracture. The [facility] joint is unremarkable. SOFT TISSUES: Atherosclerotic plaque. No abnormal calcifications. Visualized lung is unremarkable. IMPRESSION: 1. No acute fracture or dislocation. 2. Slight inferior subluxation of the humeral head in relation to the glenoid. 3. Atherosclerotic plaque. Electronically signed by: Dorethia Molt MD 04/09/2024 10:19 PM EST RP Workstation: [id number]   DG Knee Complete 4 Views Left Result Date: 04/09/2024 EXAM: 4 OR MORE VIEW(S) XRAY OF THE LEFT KNEE 04/09/2024 09:36:00 PM COMPARISON: 02/22/2024 CLINICAL HISTORY: fall  FINDINGS: BONES AND JOINTS: Left total knee arthroplasty with patellar resurfacing noted. No acute fracture. No malalignment. Small suprapatellar knee joint effusion. SOFT TISSUES: Vascular calcifications. IMPRESSION: 1. Small suprapatellar knee joint effusion. 2. 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 3. Vascular calcifications. Electronically signed by: Dorethia Molt MD 04/09/2024 10:18 PM EST RP Workstation: [id number]   DG Chest Port 1 View Result Date: 04/09/2024 EXAM: 1 VIEW(S) XRAY OF THE CHEST 04/09/2024 08:40:00 PM COMPARISON: 02/14/2024 CLINICAL HISTORY: Questionable sepsis - evaluate for abnormality FINDINGS: LUNGS AND PLEURA: Low lung volume. Elevated right hemidiaphragm. No focal pulmonary opacity. No pleural effusion. No pneumothorax. HEART AND MEDIASTINUM: Aortic arch calcifications. No acute abnormality of the cardiac and mediastinal silhouettes. BONES AND SOFT TISSUES: Right shoulder replacement noted. No acute osseous abnormality. IMPRESSION: 1. No acute findings. Electronically signed by: Franky Stanford MD  04/09/2024 09:25 PM EST RP Workstation: [id number]   CT HEAD WO CONTRAST ( ) Result Date: 04/09/2024 EXAM: CT HEAD WITHOUT CONTRAST 04/09/2024 09:10:00 PM TECHNIQUE: CT of the head was performed without the administration of intravenous contrast. Automated exposure control, iterative reconstruction, and/or weight based adjustment of the mA/kV was utilized to reduce the radiation dose to as low as reasonably achievable. COMPARISON: 10/14/2023 CLINICAL HISTORY: Mental status change, unknown cause FINDINGS: BRAIN AND VENTRICLES: No acute hemorrhage. No evidence of acute infarct. No hydrocephalus. No extra-axial collection. No mass effect or midline shift. Stable atrophy and chronic small vessel ischemia. ORBITS: Right cataract resection noted. SINUSES: No acute abnormality. SOFT TISSUES AND SKULL: No acute soft tissue abnormality. No skull fracture. Atherosclerosis of skullbase vasculature. IMPRESSION: 1. No acute intracranial abnormality. 2. Stable atrophy and chronic small vessel ischemia. Electronically signed by: Franky Stanford MD 04/09/2024 09:17 PM EST RP Workstation: [id number]        Scheduled Meds:  diclofenac  Sodium  2 g Topical QID   divalproex   125 mg Oral QPM   enoxaparin  (LOVENOX ) injection  40 mg Subcutaneous Q24H   pantoprazole   40 mg Oral Daily   Continuous Infusions:  cefTRIAXone  (ROCEPHIN )  IV 1 g (04/11/24 1021)     LOS: 0 days    Time spent: 40 minutes    Renato Applebaum, MD [facility]   "

## 2024-04-11 NOTE — Care Management Obs Status (Signed)
 MEDICARE OBSERVATION STATUS NOTIFICATION   Patient Details  Name: Gina [name] MRN: [id number] Date of Birth: [birth year]   Medicare Observation Status Notification Given:  Yes    [name], RN 04/11/2024, 9:42 AM

## 2024-04-11 NOTE — TOC Initial Note (Addendum)
 Transition of Care [facility]) - Initial/Assessment Note    Patient Details  Name: Gina [name] MRN: [id number] Date of Birth: 1932/08/08  Transition of Care [facility]) CM/SW Contact:    [name], RN Phone Number: 04/11/2024, 9:09 AM  Clinical Narrative:       9:44 AM MOON completed.  9:09 AM           CM called [name],[name] Daughter, [phone], left message.   Patient Goals and CMS Choice            Expected Discharge Plan and Services                                              Prior Living Arrangements/Services                       Activities of Daily Living   ADL Screening (condition at time of admission) Independently performs ADLs?: No Does the patient have a NEW difficulty with bathing/dressing/toileting/self-feeding that is expected to last >3 days?: No Does the patient have a NEW difficulty with getting in/out of bed, walking, or climbing stairs that is expected to last >3 days?: Yes (Initiates electronic notice to provider for possible PT consult) Does the patient have a NEW difficulty with communication that is expected to last >3 days?: No Is the patient deaf or have difficulty hearing?: Yes Does the patient have difficulty seeing, even when wearing glasses/contacts?: No Does the patient have difficulty concentrating, remembering, or making decisions?: Yes  Permission Sought/Granted                  Emotional Assessment              Admission diagnosis:  Acute cystitis without hematuria [N30.00] Acute metabolic encephalopathy [G93.41] Patient Active Problem List   Diagnosis Date Noted   Hypertension 02/27/2024   Gastroesophageal reflux disease 02/27/2024   UTI (urinary tract infection) 02/23/2024   Left renal mass 02/15/2024   DNR (do not resuscitate) 02/15/2024   OA (osteoarthritis) of knee 12/31/2023   Primary osteoarthritis of left knee 12/31/2023   CKD stage 3a, GFR 45-59 ml/min (HCC) - baseline Scr 1-1.2  11/21/2021   Left knee pain 11/21/2021   Acute metabolic encephalopathy 11/21/2021   SIRS (systemic inflammatory response syndrome) (HCC) 06/12/2020   Fall at home, initial encounter 06/12/2020   Hypotension 06/12/2020   Hypokalemia 06/12/2020   Weakness 06/12/2020   PCP:  Teresa Thersia Jansky, MD Pharmacy:   [facility] [place], [place] - [address] [place] [address] Phone: [phone] Fax: [phone]  [id number] [place], [place] - [address] AT [facility] [address] [place] [address] Phone: [phone] Fax: [phone]  [facility] [address] [place] [address] Phone: [phone] Fax: [phone]  [facility] - [facility] [address] [place] [address] Phone: [phone] Fax: [phone]     Social Drivers of Health (SDOH) Social History: SDOH Screenings   Food Insecurity: No Food Insecurity (04/11/2024)  Housing: Low Risk (04/11/2024)  Transportation Needs: No Transportation Needs (04/11/2024)  Utilities: Not At Risk (04/11/2024)  Social Connections: Moderately Integrated (04/11/2024)  Tobacco Use: Low Risk (04/09/2024)   SDOH Interventions:  Readmission Risk Interventions     No data to display

## 2024-04-11 NOTE — Evaluation (Signed)
 Occupational Therapy Evaluation Patient Details Name: Gina [name] MRN: [id number] DOB: 01-Sep-1932 Today's Date: 04/11/2024   History of Present Illness   89 y.o. female who presented to the ED for evaluation of altered mental status, admitted with UTI and metabolic encephalopathy.  Left shoulder x-ray negative for acute fracture or dislocation.  Slight inferior subluxation of the humeral head in relation to the glenoid noted.  PMH:  paroxysmal SVT, GERD, left renal mass     Clinical Impressions Pt admitted with the above concerns. Pt currently with functional limitations due to the deficits listed below (see OT Problem List). Prior to admit, pt was living with her daughter receiving assistance as needed for self care tasks. Pt utilized RW for functional mobility/transfers. Pt will benefit from acute skilled OT to increase their safety and independence with ADL and functional mobility for ADL to facilitate discharge. Patient will benefit from continued inpatient follow up therapy, <3 hours/day.       If plan is discharge home, recommend the following:   A little help with walking and/or transfers;A lot of help with bathing/dressing/bathroom     Functional Status Assessment   Patient has had a recent decline in their functional status and demonstrates the ability to make significant improvements in function in a reasonable and predictable amount of time.     Equipment Recommendations   Other (comment) (defer to next venue of care)      Precautions/Restrictions   Precautions Precautions: Fall Recall of Precautions/Restrictions: Impaired Restrictions Weight Bearing Restrictions Per Provider Order: No     Mobility Bed Mobility Overal bed mobility: Needs Assistance Bed Mobility: Supine to Sit     Supine to sit: HOB elevated, Used rails, Min assist     General bed mobility comments: Bed pad used to help scoot hips towards EOB.    Transfers Overall transfer level:  Needs assistance Equipment used: Rolling walker (2 wheels) Transfers: Sit to/from Stand, Bed to chair/wheelchair/BSC Sit to Stand: Min assist     Step pivot transfers: Contact guard assist     General transfer comment: Assist to power up into standing. VC for hand placement during RW management. VC for sequencing and safety as pt has a fear of falling      Balance Overall balance assessment: Needs assistance, History of Falls Sitting-balance support: Feet supported, No upper extremity supported Sitting balance-Leahy Scale: Fair Sitting balance - Comments: sitting EOB   Standing balance support: Reliant on assistive device for balance, During functional activity Standing balance-Leahy Scale: Poor      ADL either performed or assessed with clinical judgement   ADL Overall ADL's : Needs assistance/impaired Eating/Feeding: Set up;Sitting   Grooming: Set up;Sitting   Upper Body Bathing: Minimal assistance;Sitting   Lower Body Bathing: Maximal assistance;Sitting/lateral leans;Sit to/from stand   Upper Body Dressing : Minimal assistance;Sitting   Lower Body Dressing: Total assistance;Sitting/lateral leans;Sit to/from stand   Toilet Transfer: Minimal assistance;Stand-pivot;BSC/3in1   Toileting- Clothing Manipulation and Hygiene: Maximal assistance;Sitting/lateral lean;Sit to/from stand       Functional mobility during ADLs: Minimal assistance;Rolling walker (2 wheels)       Vision Baseline Vision/History: 1 Wears glasses Ability to See in Adequate Light: 0 Adequate Patient Visual Report: No change from baseline       Perception Perception: Not tested       Praxis Praxis: Not tested       Pertinent Vitals/Pain Pain Assessment Pain Assessment: No/denies pain     Extremity/Trunk Assessment Upper Extremity Assessment Upper  Extremity Assessment: Generalized weakness   Lower Extremity Assessment Lower Extremity Assessment: Defer to PT evaluation   Cervical /  Trunk Assessment Cervical / Trunk Assessment: Kyphotic   Communication Communication Communication: Impaired Factors Affecting Communication: Hearing impaired   Cognition Arousal: Alert Behavior During Therapy: Restless Cognition: History of cognitive impairments             OT - Cognition Comments: pt has a fear of falling      Following commands: Impaired Following commands impaired: Follows one step commands inconsistently (hearing impairment may be a contributing factor.)     Cueing  General Comments   Cueing Techniques: Verbal cues  VSS on RA           Home Living Family/patient expects to be discharged to:: Private residence Living Arrangements: Children (daughter) Available Help at Discharge: Family;Available 24 hours/day Type of Home: House Home Access: Stairs to enter [employer] of Steps: 5 Entrance Stairs-Rails: Left Home Layout: Two level;Able to live on main level with bedroom/bathroom Alternate Level Stairs-Number of Steps: main and upper level to home; she resides on the main level of the home   Bathroom Shower/Tub: Chief Strategy Officer: Standard     Home Equipment: Agricultural Consultant (2 wheels);Cane - single point;BSC/3in1;Tub bench          Prior Functioning/Environment Prior Level of Function : Needs assist             Mobility Comments: was independent up until knee sx in 12/31/23; walking with walker at home per dtr, went to rehab afer TKA ADLs Comments: Per chart review: Prior to her L knee replacement in September, the pt required assist for spongebathing and occasional assist for toileting hygiene. She could dress herself, though with some difficulty. Her daughter completed the laundry & household cleaning.    OT Problem List: Impaired balance (sitting and/or standing);Decreased activity tolerance;Decreased safety awareness;Decreased strength   OT Treatment/Interventions: Self-care/ADL training;Therapeutic  exercise;Therapeutic activities;Patient/family education;DME and/or AE instruction;Balance training      OT Goals(Current goals can be found in the care plan section)   Acute Rehab OT Goals Patient Stated Goal: to get out of bed OT Goal Formulation: With patient Time For Goal Achievement: 04/25/24 Potential to Achieve Goals: Good   OT Frequency:  Min 2X/week       AM-PAC OT 6 Clicks Daily Activity     Outcome Measure Help from another person eating meals?: A Little Help from another person taking care of personal grooming?: A Little Help from another person toileting, which includes using toliet, bedpan, or urinal?: Total Help from another person bathing (including washing, rinsing, drying)?: A Lot Help from another person to put on and taking off regular upper body clothing?: A Little Help from another person to put on and taking off regular lower body clothing?: Total 6 Click Score: 13   End of Session Equipment Utilized During Treatment: Gait belt;Rolling walker (2 wheels) Nurse Communication: Mobility status  Activity Tolerance: Patient tolerated treatment well Patient left: in chair;with call bell/phone within reach;with chair alarm set  OT Visit Diagnosis: Muscle weakness (generalized) (M62.81);History of falling (Z91.81)                Time: 0934-1000 OT Time Calculation (min): 26 min Charges:  OT General Charges $OT Visit: 1 Visit OT Evaluation $OT Eval Low Complexity: 1 Low  [name], OTR/L,CBIS  Supplemental OT - [facility] and [facility] Secure Chat Preferred    Haruko Mersch, Leita BIRCH 04/11/2024, 1:31  PM

## 2024-04-12 DIAGNOSIS — G9341 Metabolic encephalopathy: Secondary | ICD-10-CM | POA: Diagnosis not present

## 2024-04-12 LAB — URINE CULTURE: Culture: >=100000 — AB

## 2024-04-12 LAB — GLUCOSE, CAPILLARY: Glucose-Capillary: 104 mg/dL — ABNORMAL HIGH (ref 70–99)

## 2024-04-12 MED ORDER — AMOXICILLIN 500 MG PO CAPS
500.0000 mg | ORAL_CAPSULE | Freq: Three times a day (TID) | ORAL | Status: DC
  Administered 2024-04-12 – 2024-04-16 (×13): 500 mg via ORAL
  Filled 2024-04-12 (×14): qty 1

## 2024-04-12 NOTE — Progress Notes (Signed)
 " PROGRESS NOTE    Gina [name]  FMW:[id number] DOB: 20-Apr-1932 DOA: 04/09/2024 PCP: Teresa Thersia Jansky, MD    Brief Narrative:  89 y.o. female with medical history significant for paroxysmal SVT, GERD, left renal mass who presented to the ED for evaluation of altered mental status . She lives with her daughter and recently having difficulty with behaviors and recurrent UTI.  Found to have acute UTI.  Admitted due to significant symptoms.  Subjective: Mostly resting. Keeps closing her eyes, she tells me she is alright . Nurses reported some impulsive behavior yesterday.  Assessment & Plan:   Acute metabolic encephalopathy in a patient with dementia Acute UTI present on admission  Clinically improving.  Urine culture with Enteroccus.  Patient on Rocephin  x 3 days today.  Will change to amoxicillin  500 mg twice daily for 4 additional days.   No focal deficits. Delirium precautions.  Fall precautions. PT OT-refer to SNF. She does have osteoarthritic changes, can use tramadol  and Voltaren  gel.  GERD: On PPI.  Left renal mass: Recently diagnosed.  Elected not to pursue biopsy.  Surveillance recommended.   Hospital-acquired delirium: Fall precautions.  Delirium precautions.     DVT prophylaxis: enoxaparin  (LOVENOX ) injection 40 mg Start: 04/10/24 1000   Code Status: DNR/DNI Family Communication: Daughter called and updated. Disposition Plan: Status is: Observation The patient will require care spanning > 2 midnights and should be moved to inpatient because: IV antibiotics, placement     Consultants:  None  Procedures:  None  Antimicrobials:  Rocephin  12/31--- 1/3 Amoxicillin  1/3---     Objective: Vitals:   04/10/24 1947 04/11/24 0547 04/11/24 1622 04/12/24 0441  BP: 128/72 (!) 157/83 (!) 155/60 (!) 104/57  Pulse: 93 79 (!) 105 74  Resp: 18 18 16 17   Temp: 97.7 F (36.5 C) 98.2 F (36.8 C) (!) 97.5 F (36.4 C) 98.1 F (36.7 C)  TempSrc:  Oral Oral    SpO2: 94% 97% 96%     Intake/Output Summary (Last 24 hours) at 04/12/2024 1155 Last data filed at 04/11/2024 1940 Gross per 24 hour  Intake 223.89 ml  Output 1100 ml  Net -876.11 ml   There were no vitals filed for this visit.  Examination:  General exam: Appears calm and comfortable .  Irritable on exam. Respiratory system: Clear to auscultation. Respiratory effort normal.  No added sounds. Cardiovascular system: S1 & S2 heard, RRR.  Gastrointestinal system: Soft.  Nontender.  Bowel sound present. Central nervous system: Alert and awake.  Oriented to herself, family and to the place. Extremities: Equal in all extremities.  Gross generalized weakness.    Data Reviewed: I have personally reviewed following labs and imaging studies  CBC: Recent Labs  Lab 04/09/24 2049 04/10/24 0503  WBC 8.0 6.6  NEUTROABS 6.1  --   HGB 14.0 12.2  HCT 41.4 36.5  MCV 97.0 97.1  PLT 224 163   Basic Metabolic Panel: Recent Labs  Lab 04/09/24 2049 04/10/24 0503  NA 135 137  K 4.3 4.7  CL 102 107  CO2 24 22  GLUCOSE 90 80  BUN 25* 20  CREATININE 0.74 0.71  CALCIUM 10.1 9.8   GFR: CrCl cannot be calculated (Unknown ideal weight.). Liver Function Tests: Recent Labs  Lab 04/09/24 2049  AST 27  ALT 7  ALKPHOS 95  BILITOT 0.8  PROT 6.7  ALBUMIN 3.6   No results for input(s): LIPASE, AMYLASE in the last 168 hours. No results for input(s): AMMONIA in the  last 168 hours. Coagulation Profile: Recent Labs  Lab 04/09/24 2049  INR 1.1   Cardiac Enzymes: No results for input(s): CKTOTAL, CKMB, CKMBINDEX, TROPONINI in the last 168 hours. BNP (last 3 results) No results for input(s): PROBNP in the last 8760 hours. HbA1C: No results for input(s): HGBA1C in the last 72 hours. CBG: No results for input(s): GLUCAP in the last 168 hours. Lipid Profile: No results for input(s): CHOL, HDL, LDLCALC, TRIG, CHOLHDL, LDLDIRECT in the last 72  hours. Thyroid  Function Tests: No results for input(s): TSH, T4TOTAL, FREET4, T3FREE, THYROIDAB in the last 72 hours. Anemia Panel: No results for input(s): VITAMINB12, FOLATE, FERRITIN, TIBC, IRON, RETICCTPCT in the last 72 hours. Sepsis Labs: Recent Labs  Lab 04/09/24 2125 04/09/24 2331 04/09/24 2348  LATICACIDVEN 2.1* 5.7* 1.3    Recent Results (from the past 240 hours)  Resp panel by RT-PCR (RSV, Flu A&B, Covid) Anterior Nasal Swab     Status: None   Collection Time: 04/09/24  8:49 PM   Specimen: Anterior Nasal Swab  Result Value Ref Range Status   SARS Coronavirus 2 by RT PCR NEGATIVE NEGATIVE Final    Comment: (NOTE) SARS-CoV-2 target nucleic acids are NOT DETECTED.  The SARS-CoV-2 RNA is generally detectable in upper respiratory specimens during the acute phase of infection. The lowest concentration of SARS-CoV-2 viral copies this assay can detect is 138 copies/mL. A negative result does not preclude SARS-Cov-2 infection and should not be used as the sole basis for treatment or other patient management decisions. A negative result may occur with  improper specimen collection/handling, submission of specimen other than nasopharyngeal swab, presence of viral mutation(s) within the areas targeted by this assay, and inadequate number of viral copies(<138 copies/mL). A negative result must be combined with clinical observations, patient history, and epidemiological information. The expected result is Negative.  Fact Sheet for Patients:  bloggercourse.com  Fact Sheet for Healthcare Providers:  seriousbroker.it  This test is no t yet approved or cleared by the [place]  FDA and  has been authorized for detection and/or diagnosis of SARS-CoV-2 by FDA under an Emergency Use Authorization (EUA). This EUA will remain  in effect (meaning this test can be used) for the duration of the COVID-19  declaration under Section 564(b)(1) of the Act, 21 U.S.C.section 360bbb-3(b)(1), unless the authorization is terminated  or revoked sooner.       Influenza A by PCR NEGATIVE NEGATIVE Final   Influenza B by PCR NEGATIVE NEGATIVE Final    Comment: (NOTE) The Xpert Xpress SARS-CoV-2/FLU/RSV plus assay is intended as an aid in the diagnosis of influenza from Nasopharyngeal swab specimens and should not be used as a sole basis for treatment. Nasal washings and aspirates are unacceptable for Xpert Xpress SARS-CoV-2/FLU/RSV testing.  Fact Sheet for Patients: bloggercourse.com  Fact Sheet for Healthcare Providers: seriousbroker.it  This test is not yet approved or cleared by the [place]  FDA and has been authorized for detection and/or diagnosis of SARS-CoV-2 by FDA under an Emergency Use Authorization (EUA). This EUA will remain in effect (meaning this test can be used) for the duration of the COVID-19 declaration under Section 564(b)(1) of the Act, 21 U.S.C. section 360bbb-3(b)(1), unless the authorization is terminated or revoked.     Resp Syncytial Virus by PCR NEGATIVE NEGATIVE Final    Comment: (NOTE) Fact Sheet for Patients: bloggercourse.com  Fact Sheet for Healthcare Providers: seriousbroker.it  This test is not yet approved or cleared by the [place]  FDA and has been  authorized for detection and/or diagnosis of SARS-CoV-2 by FDA under an Emergency Use Authorization (EUA). This EUA will remain in effect (meaning this test can be used) for the duration of the COVID-19 declaration under Section 564(b)(1) of the Act, 21 U.S.C. section 360bbb-3(b)(1), unless the authorization is terminated or revoked.  Performed at [facility], [address] [place], [place] [address]   Blood Culture (routine x 2)     Status: None (Preliminary result)    Collection Time: 04/09/24  8:49 PM   Specimen: BLOOD RIGHT ARM  Result Value Ref Range Status   Specimen Description   Final    BLOOD RIGHT ARM Performed at [facility], [address], [place], [place] [address]    Special Requests   Final    BOTTLES DRAWN AEROBIC AND ANAEROBIC Blood Culture results may not be optimal due to an inadequate volume of blood received in culture bottles Performed at [facility], [address] [place], [place] [address]    Culture   Final    NO GROWTH 2 DAYS Performed at [facility], [address], [place], [place] [address]    Report Status PENDING  Incomplete  Urine Culture     Status: Abnormal   Collection Time: 04/09/24  9:52 PM   Specimen: Urine, Random  Result Value Ref Range Status   Specimen Description   Final    URINE, RANDOM Performed at [facility], [address], [place], [place] [address]    Special Requests   Final    NONE Reflexed from [id number] Performed at [facility], [address] [place], [place] [address]    Culture >=100,000 COLONIES/mL ENTEROCOCCUS FAECALIS (A)  Final   Report Status 04/12/2024 FINAL  Final   Organism ID, Bacteria ENTEROCOCCUS FAECALIS (A)  Final      Susceptibility   Enterococcus faecalis - MIC*    AMPICILLIN <=2 SENSITIVE Sensitive     NITROFURANTOIN <=16 SENSITIVE Sensitive     VANCOMYCIN 1 SENSITIVE Sensitive     * >=100,000 COLONIES/mL ENTEROCOCCUS FAECALIS  Blood Culture (routine x 2)     Status: None (Preliminary result)   Collection Time: 04/10/24  5:03 AM   Specimen: BLOOD LEFT HAND  Result Value Ref Range Status   Specimen Description   Final    BLOOD LEFT HAND Performed at [facility], [address], [place], [place] [address]    Special Requests   Final    BOTTLES DRAWN AEROBIC ONLY Blood Culture results may not be optimal due to an inadequate volume of blood received in culture bottles Performed at [facility], [address] [place], [place] [address]    Culture   Final    NO GROWTH 2 DAYS Performed at [facility], [address], [place], [place] [address]    Report Status PENDING  Incomplete         Radiology Studies: No results found.       Scheduled Meds:  amoxicillin   500 mg Oral Q12H   diclofenac  Sodium  2 g Topical QID   divalproex   125 mg Oral QPM   enoxaparin  (LOVENOX ) injection  40 mg Subcutaneous Q24H   pantoprazole   40 mg Oral Daily   Continuous Infusions:     LOS: 0 days    Time spent: 40 minutes    Renato Applebaum, MD [facility]   "

## 2024-04-12 NOTE — Progress Notes (Signed)
 Mobility Specialist Progress Note:   04/12/24 1238  Mobility  Activity  (Bed Exercises)  Level of Assistance Independent  Range of Motion/Exercises Active  Activity Response Tolerated fair  Mobility Referral Yes  Mobility visit 1 Mobility  Mobility Specialist Start Time (ACUTE ONLY) 1220  Mobility Specialist Stop Time (ACUTE ONLY) 1235  Mobility Specialist Time Calculation (min) (ACUTE ONLY) 15 min   Pt was received in bed and agreed to bed exercises: Seated BLE Exercises:  1) Toe Raise/ Heel Raise: 1 x 5  2) Ankle Pumps: 1 x 5  3) Marching : 1 x 5  4) Hip Adduction (pillow squeezes): 1 x 5  Pt stated pain in left lower extremity but tolerable. Returned to bed with all needs met. Call bell in reach.  [employer] - Mobility Specialist

## 2024-04-12 NOTE — Plan of Care (Signed)
 ?  Problem: Education: ?Goal: Knowledge of General Education information will improve ?Description: Including pain rating scale, medication(s)/side effects and non-pharmacologic comfort measures ?Outcome: Progressing ?  ?Problem: Health Behavior/Discharge Planning: ?Goal: Ability to manage health-related needs will improve ?Outcome: Progressing ?  ?Problem: Clinical Measurements: ?Goal: Ability to maintain clinical measurements within normal limits will improve ?Outcome: Progressing ?Goal: Will remain free from infection ?Outcome: Progressing ?Goal: Diagnostic test results will improve ?Outcome: Progressing ?Goal: Respiratory complications will improve ?Outcome: Progressing ?Goal: Cardiovascular complication will be avoided ?Outcome: Progressing ?  ?Problem: Elimination: ?Goal: Will not experience complications related to bowel motility ?Outcome: Progressing ?Goal: Will not experience complications related to urinary retention ?Outcome: Progressing ?  ?Problem: Coping: ?Goal: Level of anxiety will decrease ?Outcome: Progressing ?  ?

## 2024-04-12 NOTE — Plan of Care (Signed)

## 2024-04-13 DIAGNOSIS — G9341 Metabolic encephalopathy: Secondary | ICD-10-CM | POA: Diagnosis not present

## 2024-04-13 MED ORDER — ENSURE PLUS HIGH PROTEIN PO LIQD
237.0000 mL | Freq: Two times a day (BID) | ORAL | Status: DC
  Administered 2024-04-13 – 2024-04-15 (×5): 237 mL via ORAL

## 2024-04-13 MED ORDER — TRAMADOL HCL 50 MG PO TABS: 50.0000 mg | ORAL_TABLET | Freq: Four times a day (QID) | ORAL | 0 refills | Status: AC | PRN

## 2024-04-13 MED ORDER — DICLOFENAC SODIUM 1 % EX GEL: 2.0000 g | Freq: Four times a day (QID) | CUTANEOUS | Status: AC

## 2024-04-13 NOTE — Plan of Care (Signed)
  Problem: Clinical Measurements: Goal: Respiratory complications will improve Outcome: Progressing Goal: Cardiovascular complication will be avoided Outcome: Progressing   Problem: Activity: Goal: Risk for activity intolerance will decrease Outcome: Progressing   Problem: Elimination: Goal: Will not experience complications related to urinary retention Outcome: Progressing   Problem: Safety: Goal: Ability to remain free from injury will improve Outcome: Progressing   Problem: Skin Integrity: Goal: Risk for impaired skin integrity will decrease Outcome: Progressing

## 2024-04-13 NOTE — Progress Notes (Signed)
 " PROGRESS NOTE    Gina [name]  FMW:[id number] DOB: [birth year] DOA: 04/09/2024 PCP: Teresa Thersia Jansky, MD    Brief Narrative:  89 y.o. female with medical history significant for paroxysmal SVT, GERD, left renal mass who presented to the ED for evaluation of altered mental status . She lives with her daughter and recently having difficulty with behaviors and recurrent UTI.  Found to have acute UTI.  Admitted due to significant symptoms.  Subjective: Patient seen and examined.  Today she was sitting in the chair.  She denied any complaints.  Remains quiet and composed.  Waiting to go to SNF.  Assessment & Plan:   Acute metabolic encephalopathy in a patient with dementia Acute UTI present on admission  Clinically improving.  Urine culture with Enteroccus.  Patient on Rocephin  x 3 days.  Continue amoxicillin  amoxicillin  500 mg twice daily for 4 additional days.   No focal deficits. Delirium precautions.  Fall precautions. PT OT-refer to SNF. She does have osteoarthritic changes, can use tramadol  and Voltaren  gel.  GERD: On PPI.  Left renal mass: Recently diagnosed.  Elected not to pursue biopsy.  Surveillance recommended.   Hospital-acquired delirium: Fall precautions.  Delirium precautions.  Stable today. Medically stable to transition to a SNF level of care.    DVT prophylaxis: enoxaparin  (LOVENOX ) injection 40 mg Start: 04/10/24 1000   Code Status: DNR/DNI Family Communication: None today Disposition Plan: Status is: Observation The patient will require care spanning > 2 midnights and should be moved to inpatient because: IV antibiotics, placement     Consultants:  None  Procedures:  None  Antimicrobials:  Rocephin  12/31--- 1/3 Amoxicillin  1/3---     Objective: Vitals:   04/12/24 0441 04/12/24 1954 04/13/24 0425 04/13/24 1328  BP: (!) 104/57 (!) 111/57 (!) 134/56 (!) 130/58  Pulse: 74 91 68 76  Resp: 17 17 20    Temp: 98.1 F (36.7 C) (!) 97.5 F  (36.4 C) 97.6 F (36.4 C) 97.8 F (36.6 C)  TempSrc:  Oral  Oral  SpO2:  98% 96% 96%   No intake or output data in the 24 hours ending 04/13/24 1456  There were no vitals filed for this visit.  Examination:  General exam: Calm and comfortable. Respiratory system: Clear to auscultation. Respiratory effort normal.  No added sounds. Cardiovascular system: S1 & S2 heard, RRR.  Gastrointestinal system: Soft.  Nontender.  Bowel sound present. Central nervous system: Alert and awake.  Oriented.  Poor participation to questions. Extremities: Equal in all extremities.  Gross generalized weakness.    Data Reviewed: I have personally reviewed following labs and imaging studies  CBC: Recent Labs  Lab 04/09/24 2049 04/10/24 0503  WBC 8.0 6.6  NEUTROABS 6.1  --   HGB 14.0 12.2  HCT 41.4 36.5  MCV 97.0 97.1  PLT 224 163   Basic Metabolic Panel: Recent Labs  Lab 04/09/24 2049 04/10/24 0503  NA 135 137  K 4.3 4.7  CL 102 107  CO2 24 22  GLUCOSE 90 80  BUN 25* 20  CREATININE 0.74 0.71  CALCIUM 10.1 9.8   GFR: CrCl cannot be calculated (Unknown ideal weight.). Liver Function Tests: Recent Labs  Lab 04/09/24 2049  AST 27  ALT 7  ALKPHOS 95  BILITOT 0.8  PROT 6.7  ALBUMIN 3.6   No results for input(s): LIPASE, AMYLASE in the last 168 hours. No results for input(s): AMMONIA in the last 168 hours. Coagulation Profile: Recent Labs  Lab 04/09/24 2049  INR 1.1   Cardiac Enzymes: No results for input(s): CKTOTAL, CKMB, CKMBINDEX, TROPONINI in the last 168 hours. BNP (last 3 results) No results for input(s): PROBNP in the last 8760 hours. HbA1C: No results for input(s): HGBA1C in the last 72 hours. CBG: Recent Labs  Lab 04/12/24 1844  GLUCAP 104*   Lipid Profile: No results for input(s): CHOL, HDL, LDLCALC, TRIG, CHOLHDL, LDLDIRECT in the last 72 hours. Thyroid  Function Tests: No results for input(s): TSH, T4TOTAL, FREET4,  T3FREE, THYROIDAB in the last 72 hours. Anemia Panel: No results for input(s): VITAMINB12, FOLATE, FERRITIN, TIBC, IRON, RETICCTPCT in the last 72 hours. Sepsis Labs: Recent Labs  Lab 04/09/24 2125 04/09/24 2331 04/09/24 2348  LATICACIDVEN 2.1* 5.7* 1.3    Recent Results (from the past 240 hours)  Resp panel by RT-PCR (RSV, Flu A&B, Covid) Anterior Nasal Swab     Status: None   Collection Time: 04/09/24  8:49 PM   Specimen: Anterior Nasal Swab  Result Value Ref Range Status   SARS Coronavirus 2 by RT PCR NEGATIVE NEGATIVE Final    Comment: (NOTE) SARS-CoV-2 target nucleic acids are NOT DETECTED.  The SARS-CoV-2 RNA is generally detectable in upper respiratory specimens during the acute phase of infection. The lowest concentration of SARS-CoV-2 viral copies this assay can detect is 138 copies/mL. A negative result does not preclude SARS-Cov-2 infection and should not be used as the sole basis for treatment or other patient management decisions. A negative result may occur with  improper specimen collection/handling, submission of specimen other than nasopharyngeal swab, presence of viral mutation(s) within the areas targeted by this assay, and inadequate number of viral copies(<138 copies/mL). A negative result must be combined with clinical observations, patient history, and epidemiological information. The expected result is Negative.  Fact Sheet for Patients:  bloggercourse.com  Fact Sheet for Healthcare Providers:  seriousbroker.it  This test is no t yet approved or cleared by the [place]  FDA and  has been authorized for detection and/or diagnosis of SARS-CoV-2 by FDA under an Emergency Use Authorization (EUA). This EUA will remain  in effect (meaning this test can be used) for the duration of the COVID-19 declaration under Section 564(b)(1) of the Act, 21 U.S.C.section 360bbb-3(b)(1), unless the  authorization is terminated  or revoked sooner.       Influenza A by PCR NEGATIVE NEGATIVE Final   Influenza B by PCR NEGATIVE NEGATIVE Final    Comment: (NOTE) The Xpert Xpress SARS-CoV-2/FLU/RSV plus assay is intended as an aid in the diagnosis of influenza from Nasopharyngeal swab specimens and should not be used as a sole basis for treatment. Nasal washings and aspirates are unacceptable for Xpert Xpress SARS-CoV-2/FLU/RSV testing.  Fact Sheet for Patients: bloggercourse.com  Fact Sheet for Healthcare Providers: seriousbroker.it  This test is not yet approved or cleared by the [place]  FDA and has been authorized for detection and/or diagnosis of SARS-CoV-2 by FDA under an Emergency Use Authorization (EUA). This EUA will remain in effect (meaning this test can be used) for the duration of the COVID-19 declaration under Section 564(b)(1) of the Act, 21 U.S.C. section 360bbb-3(b)(1), unless the authorization is terminated or revoked.     Resp Syncytial Virus by PCR NEGATIVE NEGATIVE Final    Comment: (NOTE) Fact Sheet for Patients: bloggercourse.com  Fact Sheet for Healthcare Providers: seriousbroker.it  This test is not yet approved or cleared by the [place]  FDA and has been authorized for detection and/or diagnosis of SARS-CoV-2 by FDA under an  Emergency Use Authorization (EUA). This EUA will remain in effect (meaning this test can be used) for the duration of the COVID-19 declaration under Section 564(b)(1) of the Act, 21 U.S.C. section 360bbb-3(b)(1), unless the authorization is terminated or revoked.  Performed at [facility], [address], [place], [place] [address]   Blood Culture (routine x 2)     Status: None (Preliminary result)   Collection Time: 04/09/24  8:49 PM   Specimen: BLOOD RIGHT ARM  Result Value Ref Range Status    Specimen Description   Final    BLOOD RIGHT ARM Performed at [facility], [address], [place], [place] [address]    Special Requests   Final    BOTTLES DRAWN AEROBIC AND ANAEROBIC Blood Culture results may not be optimal due to an inadequate volume of blood received in culture bottles Performed at [facility], [address], [place], [place] [address]    Culture   Final    NO GROWTH 3 DAYS Performed at [facility], [address] [place], [place] [address]    Report Status PENDING  Incomplete  Urine Culture     Status: Abnormal   Collection Time: 04/09/24  9:52 PM   Specimen: Urine, Random  Result Value Ref Range Status   Specimen Description   Final    URINE, RANDOM Performed at [facility], [address], [place], [place] [address]    Special Requests   Final    NONE Reflexed from [id number] Performed at [facility], [address] [place], [place] [address]    Culture >=100,000 COLONIES/mL ENTEROCOCCUS FAECALIS (A)  Final   Report Status 04/12/2024 FINAL  Final   Organism ID, Bacteria ENTEROCOCCUS FAECALIS (A)  Final      Susceptibility   Enterococcus faecalis - MIC*    AMPICILLIN <=2 SENSITIVE Sensitive     NITROFURANTOIN <=16 SENSITIVE Sensitive     VANCOMYCIN 1 SENSITIVE Sensitive     * >=100,000 COLONIES/mL ENTEROCOCCUS FAECALIS  Blood Culture (routine x 2)     Status: None (Preliminary result)   Collection Time: 04/10/24  5:03 AM   Specimen: BLOOD LEFT HAND  Result Value Ref Range Status   Specimen Description   Final    BLOOD LEFT HAND Performed at [facility], [address], [place], [place] [address]    Special Requests   Final    BOTTLES DRAWN AEROBIC ONLY Blood Culture results may not be optimal due to an inadequate volume of blood received in culture bottles Performed at [facility], [address] [place], [place] [address]    Culture   Final    NO  GROWTH 3 DAYS Performed at [facility], [address] [place], [place] [address]    Report Status PENDING  Incomplete         Radiology Studies: No results found.       Scheduled Meds:  amoxicillin   500 mg Oral TID   diclofenac  Sodium  2 g Topical QID   divalproex   125 mg Oral QPM   enoxaparin  (LOVENOX ) injection  40 mg Subcutaneous Q24H   feeding supplement  237 mL Oral BID BM   pantoprazole   40 mg Oral Daily   Continuous Infusions:     LOS: 0 days    Time spent: 40 minutes    Renato Applebaum, MD [facility]   "

## 2024-04-13 NOTE — Plan of Care (Signed)
   Problem: Clinical Measurements: Goal: Will remain free from infection Outcome: Progressing Goal: Diagnostic test results will improve Outcome: Progressing   Problem: Nutrition: Goal: Adequate nutrition will be maintained Outcome: Progressing

## 2024-04-14 DIAGNOSIS — G9341 Metabolic encephalopathy: Secondary | ICD-10-CM | POA: Diagnosis not present

## 2024-04-14 NOTE — Progress Notes (Signed)
 " PROGRESS NOTE    Gina [name]  FMW:[id number] DOB: [birth year] DOA: 04/09/2024 PCP: Teresa Thersia Jansky, MD    Brief Narrative:  89 y.o. female with medical history significant for paroxysmal SVT, GERD, left renal mass who presented to the ED for evaluation of altered mental status . She lives with her daughter and recently having difficulty with behaviors and recurrent UTI.  Found to have acute UTI.  Admitted due to significant symptoms.  Subjective:  Patient seen and examined.  Quite unhappy with her daughter because she is not taking her home.  Patient was looking comfortable but unhappy.  No other overnight events.  Assessment & Plan:   Acute metabolic encephalopathy in a patient with dementia Acute UTI present on admission  Clinically improving.  Urine culture with Enteroccus.  Patient on Rocephin  x 3 days.  Continue amoxicillin  amoxicillin  500 mg twice daily for 4 additional days.   No focal deficits. Delirium precautions.  Fall precautions. PT OT-refer to SNF. She does have osteoarthritic changes, can use tramadol  and Voltaren  gel.  GERD: On PPI.  Left renal mass: Recently diagnosed.  Elected not to pursue biopsy.  Surveillance recommended.   Hospital-acquired delirium: Fall precautions.  Delirium precautions.  Stable today. Medically stable to transition to a SNF level of care.    DVT prophylaxis: enoxaparin  (LOVENOX ) injection 40 mg Start: 04/10/24 1000   Code Status: DNR/DNI Family Communication: Daughter on the phone Disposition Plan: Status is: Observation The patient will require care spanning > 2 midnights and should be moved to inpatient because:  placement     Consultants:  None  Procedures:  None  Antimicrobials:  Rocephin  12/31--- 1/3 Amoxicillin  1/3---     Objective: Vitals:   04/12/24 1954 04/13/24 0425 04/13/24 1328 04/13/24 1933  BP: (!) 111/57 (!) 134/56 (!) 130/58 122/61  Pulse: 91 68 76 81  Resp: 17 20  18   Temp:  97.6 F (36.4  C) 97.8 F (36.6 C) 97.8 F (36.6 C)  TempSrc: Oral  Oral Oral  SpO2: 98% 96% 96% 95%    Intake/Output Summary (Last 24 hours) at 04/14/2024 1211 Last data filed at 04/13/2024 1230 Gross per 24 hour  Intake 240 ml  Output --  Net 240 ml    There were no vitals filed for this visit.  Examination:  General exam: Calm and comfortable.  Irritable on approach to interview. Respiratory system: Clear to auscultation. Respiratory effort normal.  No added sounds. Cardiovascular system: S1 & S2 heard, RRR.  Gastrointestinal system: Soft.  Nontender.  Bowel sound present. Central nervous system: Alert and awake.  Oriented.  Poor participation to questions. Extremities: Equal in all extremities.  Gross generalized weakness.    Data Reviewed: I have personally reviewed following labs and imaging studies  CBC: Recent Labs  Lab 04/09/24 2049 04/10/24 0503  WBC 8.0 6.6  NEUTROABS 6.1  --   HGB 14.0 12.2  HCT 41.4 36.5  MCV 97.0 97.1  PLT 224 163   Basic Metabolic Panel: Recent Labs  Lab 04/09/24 2049 04/10/24 0503  NA 135 137  K 4.3 4.7  CL 102 107  CO2 24 22  GLUCOSE 90 80  BUN 25* 20  CREATININE 0.74 0.71  CALCIUM 10.1 9.8   GFR: CrCl cannot be calculated (Unknown ideal weight.). Liver Function Tests: Recent Labs  Lab 04/09/24 2049  AST 27  ALT 7  ALKPHOS 95  BILITOT 0.8  PROT 6.7  ALBUMIN 3.6   No results for input(s): LIPASE,  AMYLASE in the last 168 hours. No results for input(s): AMMONIA in the last 168 hours. Coagulation Profile: Recent Labs  Lab 04/09/24 2049  INR 1.1   Cardiac Enzymes: No results for input(s): CKTOTAL, CKMB, CKMBINDEX, TROPONINI in the last 168 hours. BNP (last 3 results) No results for input(s): PROBNP in the last 8760 hours. HbA1C: No results for input(s): HGBA1C in the last 72 hours. CBG: Recent Labs  Lab 04/12/24 1844  GLUCAP 104*   Lipid Profile: No results for input(s): CHOL, HDL, LDLCALC,  TRIG, CHOLHDL, LDLDIRECT in the last 72 hours. Thyroid  Function Tests: No results for input(s): TSH, T4TOTAL, FREET4, T3FREE, THYROIDAB in the last 72 hours. Anemia Panel: No results for input(s): VITAMINB12, FOLATE, FERRITIN, TIBC, IRON, RETICCTPCT in the last 72 hours. Sepsis Labs: Recent Labs  Lab 04/09/24 2125 04/09/24 2331 04/09/24 2348  LATICACIDVEN 2.1* 5.7* 1.3    Recent Results (from the past 240 hours)  Resp panel by RT-PCR (RSV, Flu A&B, Covid) Anterior Nasal Swab     Status: None   Collection Time: 04/09/24  8:49 PM   Specimen: Anterior Nasal Swab  Result Value Ref Range Status   SARS Coronavirus 2 by RT PCR NEGATIVE NEGATIVE Final    Comment: (NOTE) SARS-CoV-2 target nucleic acids are NOT DETECTED.  The SARS-CoV-2 RNA is generally detectable in upper respiratory specimens during the acute phase of infection. The lowest concentration of SARS-CoV-2 viral copies this assay can detect is 138 copies/mL. A negative result does not preclude SARS-Cov-2 infection and should not be used as the sole basis for treatment or other patient management decisions. A negative result may occur with  improper specimen collection/handling, submission of specimen other than nasopharyngeal swab, presence of viral mutation(s) within the areas targeted by this assay, and inadequate number of viral copies(<138 copies/mL). A negative result must be combined with clinical observations, patient history, and epidemiological information. The expected result is Negative.  Fact Sheet for Patients:  bloggercourse.com  Fact Sheet for Healthcare Providers:  seriousbroker.it  This test is no t yet approved or cleared by the [place]  FDA and  has been authorized for detection and/or diagnosis of SARS-CoV-2 by FDA under an Emergency Use Authorization (EUA). This EUA will remain  in effect (meaning this test can be  used) for the duration of the COVID-19 declaration under Section 564(b)(1) of the Act, 21 U.S.C.section 360bbb-3(b)(1), unless the authorization is terminated  or revoked sooner.       Influenza A by PCR NEGATIVE NEGATIVE Final   Influenza B by PCR NEGATIVE NEGATIVE Final    Comment: (NOTE) The Xpert Xpress SARS-CoV-2/FLU/RSV plus assay is intended as an aid in the diagnosis of influenza from Nasopharyngeal swab specimens and should not be used as a sole basis for treatment. Nasal washings and aspirates are unacceptable for Xpert Xpress SARS-CoV-2/FLU/RSV testing.  Fact Sheet for Patients: bloggercourse.com  Fact Sheet for Healthcare Providers: seriousbroker.it  This test is not yet approved or cleared by the [place]  FDA and has been authorized for detection and/or diagnosis of SARS-CoV-2 by FDA under an Emergency Use Authorization (EUA). This EUA will remain in effect (meaning this test can be used) for the duration of the COVID-19 declaration under Section 564(b)(1) of the Act, 21 U.S.C. section 360bbb-3(b)(1), unless the authorization is terminated or revoked.     Resp Syncytial Virus by PCR NEGATIVE NEGATIVE Final    Comment: (NOTE) Fact Sheet for Patients: bloggercourse.com  Fact Sheet for Healthcare Providers: seriousbroker.it  This test  is not yet approved or cleared by the [place]  FDA and has been authorized for detection and/or diagnosis of SARS-CoV-2 by FDA under an Emergency Use Authorization (EUA). This EUA will remain in effect (meaning this test can be used) for the duration of the COVID-19 declaration under Section 564(b)(1) of the Act, 21 U.S.C. section 360bbb-3(b)(1), unless the authorization is terminated or revoked.  Performed at [facility], [address], [place], [place] [address]   Blood Culture (routine x 2)      Status: None (Preliminary result)   Collection Time: 04/09/24  8:49 PM   Specimen: BLOOD RIGHT ARM  Result Value Ref Range Status   Specimen Description   Final    BLOOD RIGHT ARM Performed at [facility], [address] [place], [place] [address]    Special Requests   Final    BOTTLES DRAWN AEROBIC AND ANAEROBIC Blood Culture results may not be optimal due to an inadequate volume of blood received in culture bottles Performed at [facility] [place], [address], [place], [place] [address]    Culture   Final    NO GROWTH 4 DAYS Performed at [facility], [address] [place], [place] [address]    Report Status PENDING  Incomplete  Urine Culture     Status: Abnormal   Collection Time: 04/09/24  9:52 PM   Specimen: Urine, Random  Result Value Ref Range Status   Specimen Description   Final    URINE, RANDOM Performed at [facility], [address], [place], [place] [address]    Special Requests   Final    NONE Reflexed from [id number] Performed at [facility], [address], [place], [place] [address]    Culture >=100,000 COLONIES/mL ENTEROCOCCUS FAECALIS (A)  Final   Report Status 04/12/2024 FINAL  Final   Organism ID, Bacteria ENTEROCOCCUS FAECALIS (A)  Final      Susceptibility   Enterococcus faecalis - MIC*    AMPICILLIN <=2 SENSITIVE Sensitive     NITROFURANTOIN <=16 SENSITIVE Sensitive     VANCOMYCIN 1 SENSITIVE Sensitive     * >=100,000 COLONIES/mL ENTEROCOCCUS FAECALIS  Blood Culture (routine x 2)     Status: None (Preliminary result)   Collection Time: 04/10/24  5:03 AM   Specimen: BLOOD LEFT HAND  Result Value Ref Range Status   Specimen Description   Final    BLOOD LEFT HAND Performed at [facility], [address] [place], [place] [address]    Special Requests   Final    BOTTLES DRAWN AEROBIC ONLY Blood Culture results may not be optimal due to an inadequate volume of blood received in  culture bottles Performed at [facility], [address], [place], [place] [address]    Culture   Final    NO GROWTH 4 DAYS Performed at [facility], [address], [place], [place] [address]    Report Status PENDING  Incomplete         Radiology Studies: No results found.       Scheduled Meds:  amoxicillin   500 mg Oral TID   diclofenac  Sodium  2 g Topical QID   divalproex   125 mg Oral QPM   enoxaparin  (LOVENOX ) injection  40 mg Subcutaneous Q24H   feeding supplement  237 mL Oral BID BM   pantoprazole   40 mg Oral Daily   Continuous Infusions:  LOS: 0 days    Time spent: 35 minutes    Renato Applebaum, MD [facility]   "

## 2024-04-14 NOTE — NC FL2 (Signed)
 " [place]  MEDICAID FL2 LEVEL OF CARE FORM     IDENTIFICATION  Patient Name: Gina [name] Birthdate: 11/11/1932 Sex: female Admission Date (Current Location): 04/09/2024  [facility] and [place] Number:  Producer, Television/film/video and Address:  [facility],  [address], [place] [address]      Provider Number: [id number]  Attending Physician Name and Address:  Raenelle Coria, MD  Relative Name and Phone Number:  clarita no (dlt) [phone]    Current Level of Care: Hospital Recommended Level of Care: Skilled Nursing Facility Prior Approval Number:    Date Approved/Denied:   PASRR Number: [id number]  Discharge Plan: SNF    Current Diagnoses: Patient Active Problem List   Diagnosis Date Noted   Hypertension 02/27/2024   Gastroesophageal reflux disease 02/27/2024   UTI (urinary tract infection) 02/23/2024   Left renal mass 02/15/2024   DNR (do not resuscitate) 02/15/2024   OA (osteoarthritis) of knee 12/31/2023   Primary osteoarthritis of left knee 12/31/2023   CKD stage 3a, GFR 45-59 ml/min (HCC) - baseline Scr 1-1.2 11/21/2021   Left knee pain 11/21/2021   Acute metabolic encephalopathy 11/21/2021   SIRS (systemic inflammatory response syndrome) (HCC) 06/12/2020   Fall at home, initial encounter 06/12/2020   Hypotension 06/12/2020   Hypokalemia 06/12/2020   Weakness 06/12/2020    Orientation RESPIRATION BLADDER Height & Weight     Self, Time, Place  Normal Incontinent, External catheter Weight:   Height:     BEHAVIORAL SYMPTOMS/MOOD NEUROLOGICAL BOWEL NUTRITION STATUS      Continent Diet  AMBULATORY STATUS COMMUNICATION OF NEEDS Skin   Limited Assist Verbally Other (Comment)                       Personal Care Assistance Level of Assistance  Bathing, Feeding, Dressing Bathing Assistance: Limited assistance Feeding assistance: Limited assistance Dressing Assistance: Limited assistance     Functional Limitations Info  Sight,  Hearing, Speech Sight Info: Impaired Hearing Info: Impaired Speech Info: Adequate    SPECIAL CARE FACTORS FREQUENCY  PT (By licensed PT), OT (By licensed OT)     PT Frequency: 5x weekly OT Frequency: 5x weekly            Contractures Contractures Info: Not present    Additional Factors Info  Code Status, Allergies Code Status Info: DNR- Intervents Allergies Info: Codeine, Stawberry Extract           Current Medications (04/14/2024):  This is the current hospital active medication list Current Facility-Administered Medications  Medication Dose Route Frequency Provider Last Rate Last Admin   acetaminophen  (TYLENOL ) tablet 650 mg  650 mg Oral Q6H PRN Patel, Vishal R, MD   650 mg at 04/13/24 2355   Or   acetaminophen  (TYLENOL ) suppository 650 mg  650 mg Rectal Q6H PRN Patel, Vishal R, MD       amoxicillin  (AMOXIL ) capsule 500 mg  500 mg Oral TID Ghimire, Kuber, MD   500 mg at 04/14/24 0937   diclofenac  Sodium (VOLTAREN ) 1 % topical gel 2 g  2 g Topical QID Ghimire, Kuber, MD   2 g at 04/14/24 9062   divalproex  (DEPAKOTE ) DR tablet 125 mg  125 mg Oral QPM Ghimire, Kuber, MD   125 mg at 04/13/24 1751   enoxaparin  (LOVENOX ) injection 40 mg  40 mg Subcutaneous Q24H Patel, Vishal R, MD   40 mg at 04/14/24 0937   feeding supplement (ENSURE PLUS HIGH PROTEIN) liquid 237 mL  237 mL Oral BID BM Raenelle Coria, MD   237 mL at 04/14/24 9062   ondansetron  (ZOFRAN ) tablet 4 mg  4 mg Oral Q6H PRN Patel, Vishal R, MD       Or   ondansetron  (ZOFRAN ) injection 4 mg  4 mg Intravenous Q6H PRN Patel, Vishal R, MD       pantoprazole  (PROTONIX ) EC tablet 40 mg  40 mg Oral Daily Patel, Vishal R, MD   40 mg at 04/14/24 9062   senna-docusate (Senokot-S) tablet 1 tablet  1 tablet Oral QHS PRN Patel, Vishal R, MD       traMADol  (ULTRAM ) tablet 50 mg  50 mg Oral Q6H PRN Ghimire, Kuber, MD   50 mg at 04/13/24 9447     Discharge Medications: Please see discharge summary for a list of discharge  medications.  Relevant Imaging Results:  Relevant Lab Results:   Additional Information SSN [id number]  [name], RN     "

## 2024-04-14 NOTE — Progress Notes (Signed)
 Physical Therapy Treatment Patient Details Name: Gina [name] MRN: [id number] DOB: 02-May-1932 Today's Date: 04/14/2024   History of Present Illness 89 y.o. female who presented to the ED for evaluation of altered mental status, admitted with UTI and metabolic encephalopathy.  Left shoulder x-ray negative for acute fracture or dislocation.  Slight inferior subluxation of the humeral head in relation to the glenoid noted.  PMH:  paroxysmal SVT, GERD, left renal mass    PT Comments  Pt is oriented to self, however it took several guesses before she could correctly state her birth year. Min assist sit to stand. She ambulated 60' with RW, no loss of balance, distance limited by fatigue. Improved activity tolerance noted today.     If plan is discharge home, recommend the following: A little help with walking and/or transfers;A little help with bathing/dressing/bathroom;Help with stairs or ramp for entrance;Assist for transportation;Assistance with cooking/housework   Can travel by private vehicle     Yes  Equipment Recommendations  None recommended by PT    Recommendations for Other Services       Precautions / Restrictions Precautions Precautions: Fall Recall of Precautions/Restrictions: Impaired Restrictions Weight Bearing Restrictions Per Provider Order: No     Mobility  Bed Mobility               General bed mobility comments: up in recliner    Transfers Overall transfer level: Needs assistance Equipment used: Rolling walker (2 wheels) Transfers: Sit to/from Stand Sit to Stand: Min assist           General transfer comment: Assist to power up into standing. VC for hand placement during RW management. VC for sequencing and safety as pt has a fear of falling    Ambulation/Gait Ambulation/Gait assistance: Contact guard assist Gait Distance (Feet): 38 Feet Assistive device: Rolling walker (2 wheels) Gait Pattern/deviations: Step-through pattern, Decreased stride  length, Trunk flexed Gait velocity: decr     General Gait Details: VCs to increase step length and for positioning in RW, distance limited by fatigue   Stairs             Wheelchair Mobility     Tilt Bed    Modified Rankin (Stroke Patients Only)       Balance Overall balance assessment: Needs assistance, History of Falls Sitting-balance support: Feet supported, No upper extremity supported Sitting balance-Leahy Scale: Fair Sitting balance - Comments: sitting EOB   Standing balance support: Reliant on assistive device for balance, During functional activity Standing balance-Leahy Scale: Poor                              Communication Communication Communication: Impaired Factors Affecting Communication: Hearing impaired  Cognition Arousal: Alert Behavior During Therapy: WFL for tasks assessed/performed   PT - Cognitive impairments: No family/caregiver present to determine baseline                       PT - Cognition Comments: per chart suspect underlying dementia, not formally dx. pt oriented to self (it took several attempts before she could correctly state her birth year), and to location Following commands: Impaired Following commands impaired: Follows one step commands with increased time    Cueing Cueing Techniques: Verbal cues  Exercises      General Comments        Pertinent Vitals/Pain Pain Assessment Pain Assessment: No/denies pain    Home Living  Prior Function            PT Goals (current goals can now be found in the care plan section) Acute Rehab PT Goals Patient Stated Goal: rehab or transition to [facility] PT Goal Formulation: Patient unable to participate in goal setting Time For Goal Achievement: 04/24/24 Potential to Achieve Goals: Good Progress towards PT goals: Progressing toward goals    Frequency    Min 2X/week      PT Plan      Co-evaluation               AM-PAC PT 6 Clicks Mobility   Outcome Measure  Help needed turning from your back to your side while in a flat bed without using bedrails?: A Little Help needed moving from lying on your back to sitting on the side of a flat bed without using bedrails?: A Little Help needed moving to and from a bed to a chair (including a wheelchair)?: A Little Help needed standing up from a chair using your arms (e.g., wheelchair or bedside chair)?: A Little Help needed to walk in hospital room?: A Little Help needed climbing 3-5 steps with a railing? : A Lot 6 Click Score: 17    End of Session Equipment Utilized During Treatment: Gait belt Activity Tolerance: Patient tolerated treatment well Patient left: in chair;with chair alarm set;with call bell/phone within reach Nurse Communication: Mobility status PT Visit Diagnosis: Other abnormalities of gait and mobility (R26.89)     Time: 8565-8550 PT Time Calculation (min) (ACUTE ONLY): 15 min  Charges:    $Gait Training: 8-22 mins PT General Charges $$ ACUTE PT VISIT: 1 Visit                     Sylvan Delon Copp PT 04/14/2024  Acute Rehabilitation Services  Office [phone]

## 2024-04-14 NOTE — Plan of Care (Signed)
  Problem: Clinical Measurements: Goal: Respiratory complications will improve Outcome: Progressing Goal: Cardiovascular complication will be avoided Outcome: Progressing   Problem: Activity: Goal: Risk for activity intolerance will decrease Outcome: Progressing   Problem: Coping: Goal: Level of anxiety will decrease Outcome: Progressing   Problem: Elimination: Goal: Will not experience complications related to urinary retention Outcome: Progressing   Problem: Pain Managment: Goal: General experience of comfort will improve and/or be controlled Outcome: Progressing   Problem: Safety: Goal: Ability to remain free from injury will improve Outcome: Progressing   Problem: Skin Integrity: Goal: Risk for impaired skin integrity will decrease Outcome: Progressing

## 2024-04-14 NOTE — TOC Initial Note (Addendum)
 Transition of Care [facility]) - Initial/Assessment Note    Patient Details  Name: Gina [name] MRN: [id number] Date of Birth: 1932-07-29  [facility]) CM/SW Contact:    [name], RN Phone Number: 04/14/2024, 10:00 AM  Clinical Narrative:          10:19 AM FL2 started, SNF referrals sent via HUB. Waiting bed offers 10:00 am        CM called [name],[name] Daughter, [phone] to discussed SNF recommendation and agreeable.    Patient Goals and CMS Choice            Expected Discharge Plan and Services                                              Prior Living Arrangements/Services                       Activities of Daily Living   ADL Screening (condition at time of admission) Independently performs ADLs?: No Does the patient have a NEW difficulty with bathing/dressing/toileting/self-feeding that is expected to last >3 days?: No Does the patient have a NEW difficulty with getting in/out of bed, walking, or climbing stairs that is expected to last >3 days?: Yes (Initiates electronic notice to provider for possible PT consult) Does the patient have a NEW difficulty with communication that is expected to last >3 days?: No Is the patient deaf or have difficulty hearing?: Yes Does the patient have difficulty seeing, even when wearing glasses/contacts?: No Does the patient have difficulty concentrating, remembering, or making decisions?: Yes  Permission Sought/Granted                  Emotional Assessment              Admission diagnosis:  Acute cystitis without hematuria [N30.00] Acute metabolic encephalopathy [G93.41] Patient Active Problem List   Diagnosis Date Noted   Hypertension 02/27/2024   Gastroesophageal reflux disease 02/27/2024   UTI (urinary tract infection) 02/23/2024   Left renal mass 02/15/2024   DNR (do not resuscitate) 02/15/2024   OA (osteoarthritis) of knee 12/31/2023   Primary osteoarthritis of  left knee 12/31/2023   CKD stage 3a, GFR 45-59 ml/min (HCC) - baseline Scr 1-1.2 11/21/2021   Left knee pain 11/21/2021   Acute metabolic encephalopathy 11/21/2021   SIRS (systemic inflammatory response syndrome) (HCC) 06/12/2020   Fall at home, initial encounter 06/12/2020   Hypotension 06/12/2020   Hypokalemia 06/12/2020   Weakness 06/12/2020   PCP:  Teresa Thersia Jansky, MD Pharmacy:   [facility] [place], [place] - [address] [place] [address] Phone: [phone] Fax: [phone]  [id number] [place], [place] - [address] AT [facility] OF [address] [place] [address] Phone: [phone] Fax: [phone]  [facility] [address] [place] [address] Phone: [phone] Fax: [phone]  [facility] [address] [place] [address] Phone: [phone] Fax: [phone]     Social Drivers of Health (SDOH) Social History: SDOH Screenings   Food Insecurity: No Food Insecurity (04/11/2024)  Housing: Low Risk (04/11/2024)  Transportation Needs: No Transportation Needs (04/11/2024)  Utilities: Not At Risk (04/11/2024)  Social Connections: Moderately Integrated (04/11/2024)  Tobacco Use: Low Risk (04/09/2024)   SDOH Interventions:     Readmission Risk Interventions     No data to display

## 2024-04-14 NOTE — Plan of Care (Signed)
   Problem: Clinical Measurements: Goal: Will remain free from infection Outcome: Progressing Goal: Diagnostic test results will improve Outcome: Progressing   Problem: Nutrition: Goal: Adequate nutrition will be maintained Outcome: Progressing

## 2024-04-15 DIAGNOSIS — G9341 Metabolic encephalopathy: Secondary | ICD-10-CM | POA: Diagnosis not present

## 2024-04-15 LAB — CULTURE, BLOOD (ROUTINE X 2)
Culture: NO GROWTH
Culture: NO GROWTH

## 2024-04-15 NOTE — Progress Notes (Signed)
 " PROGRESS NOTE    Samella Lucchetti  FMW:[id number] DOB: Feb 09, 1933 DOA: 04/09/2024 PCP: Teresa Thersia Jansky, MD    Brief Narrative:  89 y.o. female with medical history significant for paroxysmal SVT, GERD, left renal mass who presented to the ED for evaluation of altered mental status . She lives with her daughter and recently having difficulty with behaviors and recurrent UTI.  Found to have acute UTI.  Admitted due to significant symptoms.  Subjective:  Patient seen and examined.  No overnight events.  Usual for her to be yelling and shouting. She was sitting in chair and asked me to look for her phone.  Assessment & Plan:   Acute metabolic encephalopathy in a patient with dementia Acute UTI present on admission  Clinically improving.  Urine culture with Enteroccus.  Patient on Rocephin  x 3 days.  Continue amoxicillin  amoxicillin  500 mg twice daily for 4 additional days.   No focal deficits. Delirium precautions.  Fall precautions. PT OT-refer to SNF. She does have osteoarthritic changes, can use tramadol  and Voltaren  gel.  GERD: On PPI.  Left renal mass: Recently diagnosed.  Elected not to pursue biopsy.  Surveillance recommended.   Hospital-acquired delirium: Fall precautions.  Delirium precautions.  Stable today. Medically stable to transition to a SNF level of care.    DVT prophylaxis: enoxaparin  (LOVENOX ) injection 40 mg Start: 04/10/24 1000   Code Status: DNR/DNI Family Communication: Daughter on the phone Disposition Plan: Status is: Observation The patient will require care spanning > 2 midnights and should be moved to inpatient because:  placement     Consultants:  None  Procedures:  None  Antimicrobials:  Rocephin  12/31--- 1/3 Amoxicillin  1/3---     Objective: Vitals:   04/13/24 0425 04/13/24 1328 04/13/24 1933 04/14/24 2014  BP: (!) 134/56 (!) 130/58 122/61 (!) 112/52  Pulse: 68 76 81 76  Resp: 20  18 16   Temp: 97.6 F (36.4 C) 97.8 F  (36.6 C) 97.8 F (36.6 C) 97.8 F (36.6 C)  TempSrc:  Oral Oral   SpO2: 96% 96% 95% 97%    Intake/Output Summary (Last 24 hours) at 04/15/2024 1157 Last data filed at 04/15/2024 9394 Gross per 24 hour  Intake --  Output 1100 ml  Net -1100 ml    There were no vitals filed for this visit.  Examination:  General exam: Looks comfortable.  Irritable and shouting on approach.  Hard of hearing. Respiratory system: Clear to auscultation. Respiratory effort normal.  No added sounds. Cardiovascular system: S1 & S2 heard, RRR.  Gastrointestinal system: Soft.  Nontender.  Bowel sound present. Central nervous system: Alert and awake.  Oriented.  Poor participation to questions.    Data Reviewed: I have personally reviewed following labs and imaging studies  CBC: Recent Labs  Lab 04/09/24 2049 04/10/24 0503  WBC 8.0 6.6  NEUTROABS 6.1  --   HGB 14.0 12.2  HCT 41.4 36.5  MCV 97.0 97.1  PLT 224 163   Basic Metabolic Panel: Recent Labs  Lab 04/09/24 2049 04/10/24 0503  NA 135 137  K 4.3 4.7  CL 102 107  CO2 24 22  GLUCOSE 90 80  BUN 25* 20  CREATININE 0.74 0.71  CALCIUM 10.1 9.8   GFR: CrCl cannot be calculated (Unknown ideal weight.). Liver Function Tests: Recent Labs  Lab 04/09/24 2049  AST 27  ALT 7  ALKPHOS 95  BILITOT 0.8  PROT 6.7  ALBUMIN 3.6   No results for input(s): LIPASE, AMYLASE in  the last 168 hours. No results for input(s): AMMONIA in the last 168 hours. Coagulation Profile: Recent Labs  Lab 04/09/24 2049  INR 1.1   Cardiac Enzymes: No results for input(s): CKTOTAL, CKMB, CKMBINDEX, TROPONINI in the last 168 hours. BNP (last 3 results) No results for input(s): PROBNP in the last 8760 hours. HbA1C: No results for input(s): HGBA1C in the last 72 hours. CBG: Recent Labs  Lab 04/12/24 1844  GLUCAP 104*   Lipid Profile: No results for input(s): CHOL, HDL, LDLCALC, TRIG, CHOLHDL, LDLDIRECT in the last 72  hours. Thyroid  Function Tests: No results for input(s): TSH, T4TOTAL, FREET4, T3FREE, THYROIDAB in the last 72 hours. Anemia Panel: No results for input(s): VITAMINB12, FOLATE, FERRITIN, TIBC, IRON, RETICCTPCT in the last 72 hours. Sepsis Labs: Recent Labs  Lab 04/09/24 2125 04/09/24 2331 04/09/24 2348  LATICACIDVEN 2.1* 5.7* 1.3    Recent Results (from the past 240 hours)  Resp panel by RT-PCR (RSV, Flu A&B, Covid) Anterior Nasal Swab     Status: None   Collection Time: 04/09/24  8:49 PM   Specimen: Anterior Nasal Swab  Result Value Ref Range Status   SARS Coronavirus 2 by RT PCR NEGATIVE NEGATIVE Final    Comment: (NOTE) SARS-CoV-2 target nucleic acids are NOT DETECTED.  The SARS-CoV-2 RNA is generally detectable in upper respiratory specimens during the acute phase of infection. The lowest concentration of SARS-CoV-2 viral copies this assay can detect is 138 copies/mL. A negative result does not preclude SARS-Cov-2 infection and should not be used as the sole basis for treatment or other patient management decisions. A negative result may occur with  improper specimen collection/handling, submission of specimen other than nasopharyngeal swab, presence of viral mutation(s) within the areas targeted by this assay, and inadequate number of viral copies(<138 copies/mL). A negative result must be combined with clinical observations, patient history, and epidemiological information. The expected result is Negative.  Fact Sheet for Patients:  bloggercourse.com  Fact Sheet for Healthcare Providers:  seriousbroker.it  This test is no t yet approved or cleared by the [place]  FDA and  has been authorized for detection and/or diagnosis of SARS-CoV-2 by FDA under an Emergency Use Authorization (EUA). This EUA will remain  in effect (meaning this test can be used) for the duration of the COVID-19  declaration under Section 564(b)(1) of the Act, 21 U.S.C.section 360bbb-3(b)(1), unless the authorization is terminated  or revoked sooner.       Influenza A by PCR NEGATIVE NEGATIVE Final   Influenza B by PCR NEGATIVE NEGATIVE Final    Comment: (NOTE) The Xpert Xpress SARS-CoV-2/FLU/RSV plus assay is intended as an aid in the diagnosis of influenza from Nasopharyngeal swab specimens and should not be used as a sole basis for treatment. Nasal washings and aspirates are unacceptable for Xpert Xpress SARS-CoV-2/FLU/RSV testing.  Fact Sheet for Patients: bloggercourse.com  Fact Sheet for Healthcare Providers: seriousbroker.it  This test is not yet approved or cleared by the [place]  FDA and has been authorized for detection and/or diagnosis of SARS-CoV-2 by FDA under an Emergency Use Authorization (EUA). This EUA will remain in effect (meaning this test can be used) for the duration of the COVID-19 declaration under Section 564(b)(1) of the Act, 21 U.S.C. section 360bbb-3(b)(1), unless the authorization is terminated or revoked.     Resp Syncytial Virus by PCR NEGATIVE NEGATIVE Final    Comment: (NOTE) Fact Sheet for Patients: bloggercourse.com  Fact Sheet for Healthcare Providers: seriousbroker.it  This test is not  yet approved or cleared by the [place]  FDA and has been authorized for detection and/or diagnosis of SARS-CoV-2 by FDA under an Emergency Use Authorization (EUA). This EUA will remain in effect (meaning this test can be used) for the duration of the COVID-19 declaration under Section 564(b)(1) of the Act, 21 U.S.C. section 360bbb-3(b)(1), unless the authorization is terminated or revoked.  Performed at [facility], [address] [place], [place] [address]   Blood Culture (routine x 2)     Status: None   Collection Time: 04/09/24   8:49 PM   Specimen: BLOOD RIGHT ARM  Result Value Ref Range Status   Specimen Description   Final    BLOOD RIGHT ARM Performed at [facility], [address] [place], [place] [address]    Special Requests   Final    BOTTLES DRAWN AEROBIC AND ANAEROBIC Blood Culture results may not be optimal due to an inadequate volume of blood received in culture bottles Performed at [facility], [address], [place], [place] [address]    Culture   Final    NO GROWTH 5 DAYS Performed at [facility], [address] [place], [place] [address]    Report Status 04/15/2024 FINAL  Final  Urine Culture     Status: Abnormal   Collection Time: 04/09/24  9:52 PM   Specimen: Urine, Random  Result Value Ref Range Status   Specimen Description   Final    URINE, RANDOM Performed at [facility], [address], [place], [place] [address]    Special Requests   Final    NONE Reflexed from [id number] Performed at [facility], [address] [place], [place] [address]    Culture >=100,000 COLONIES/mL ENTEROCOCCUS FAECALIS (A)  Final   Report Status 04/12/2024 FINAL  Final   Organism ID, Bacteria ENTEROCOCCUS FAECALIS (A)  Final      Susceptibility   Enterococcus faecalis - MIC*    AMPICILLIN <=2 SENSITIVE Sensitive     NITROFURANTOIN <=16 SENSITIVE Sensitive     VANCOMYCIN 1 SENSITIVE Sensitive     * >=100,000 COLONIES/mL ENTEROCOCCUS FAECALIS  Blood Culture (routine x 2)     Status: None   Collection Time: 04/10/24  5:03 AM   Specimen: BLOOD LEFT HAND  Result Value Ref Range Status   Specimen Description   Final    BLOOD LEFT HAND Performed at [facility], [address], [place], [place] [address]    Special Requests   Final    BOTTLES DRAWN AEROBIC ONLY Blood Culture results may not be optimal due to an inadequate volume of blood received in culture bottles Performed at [facility], [address], [place], [place] [address]    Culture   Final    NO GROWTH 5 DAYS Performed at [facility], [address] [place], [place] [address]    Report Status 04/15/2024 FINAL  Final         Radiology Studies: No results found.       Scheduled Meds:  amoxicillin   500 mg Oral TID   diclofenac  Sodium  2 g Topical QID   divalproex   125 mg Oral QPM   enoxaparin  (LOVENOX ) injection  40 mg Subcutaneous Q24H   feeding supplement  237 mL Oral BID BM   pantoprazole   40 mg Oral Daily   Continuous Infusions:     LOS: 0 days  Time spent: 25 minutes    Renato Applebaum, MD [facility]   "

## 2024-04-15 NOTE — Progress Notes (Signed)
 Patient refused vitals and dressing change this morning, yelling and smacking at staff.

## 2024-04-15 NOTE — Plan of Care (Signed)

## 2024-04-15 NOTE — Progress Notes (Signed)
 Occupational Therapy Treatment Patient Details Name: Gina [name] MRN: [id number] DOB: 1932-09-11 Today's Date: 04/15/2024   History of present illness 89 y.o. female who presented to the ED for evaluation of altered mental status, admitted with UTI and metabolic encephalopathy.  Left shoulder x-ray negative for acute fracture or dislocation.  Slight inferior subluxation of the humeral head in relation to the glenoid noted.  PMH:  paroxysmal SVT, GERD, left renal mass   OT comments  Pt in bed upon therapy arrival and agreeable to participate in OT session. Session focused on bed mobility and functional transfers while transitioning from bed to recliner. Pt demonstrates improvement with sit to stand transition requiring only CGA while utilizing RW. Patient will benefit from continued inpatient follow up therapy, <3 hours/day.       If plan is discharge home, recommend the following:  A little help with walking and/or transfers;A lot of help with bathing/dressing/bathroom   Equipment Recommendations  Other (comment) (defer to next venue of care)       Precautions / Restrictions Precautions Precautions: Fall Recall of Precautions/Restrictions: Impaired Restrictions Weight Bearing Restrictions Per Provider Order: No       Mobility Bed Mobility Overal bed mobility: Needs Assistance Bed Mobility: Supine to Sit     Supine to sit: Supervision, HOB elevated     General bed mobility comments: no physical assistance required    Transfers Overall transfer level: Needs assistance Equipment used: Rolling walker (2 wheels) Transfers: Sit to/from Stand, Bed to chair/wheelchair/BSC Sit to Stand: Contact guard assist     Step pivot transfers: Contact guard assist, From elevated surface     General transfer comment: VC for safety overall. No assist required to power up from an elevated surface.     Balance Overall balance assessment: Needs assistance, History of  Falls Sitting-balance support: Feet supported, No upper extremity supported Sitting balance-Leahy Scale: Fair Sitting balance - Comments: sitting EOB   Standing balance support: Reliant on assistive device for balance, During functional activity Standing balance-Leahy Scale: Poor     ADL either performed or assessed with clinical judgement              Communication Communication Communication: Impaired Factors Affecting Communication: Hearing impaired   Cognition Arousal: Alert Behavior During Therapy: WFL for tasks assessed/performed Cognition: No apparent impairments    Following commands: Impaired Following commands impaired: Follows one step commands with increased time      Cueing   Cueing Techniques: Verbal cues        General Comments VSS on RA    Pertinent Vitals/ Pain       Pain Assessment Pain Assessment: No/denies pain         Frequency  Min 2X/week        Progress Toward Goals  OT Goals(current goals can now be found in the care plan section)  Progress towards OT goals: Progressing toward goals            AM-PAC OT 6 Clicks Daily Activity     Outcome Measure   Help from another person eating meals?: A Little Help from another person taking care of personal grooming?: A Little Help from another person toileting, which includes using toliet, bedpan, or urinal?: Total Help from another person bathing (including washing, rinsing, drying)?: A Lot Help from another person to put on and taking off regular upper body clothing?: A Little Help from another person to put on and taking off regular lower body clothing?: Total 6  Click Score: 13    End of Session Equipment Utilized During Treatment: Gait belt;Rolling walker (2 wheels)  OT Visit Diagnosis: Muscle weakness (generalized) (M62.81);History of falling (Z91.81)   Activity Tolerance Patient tolerated treatment well   Patient Left in chair;with call bell/phone within reach;with chair  alarm set           Time: 8493-8482 OT Time Calculation (min): 11 min  Charges: OT General Charges $OT Visit: 1 Visit OT Treatments $Self Care/Home Management : 8-22 mins  Gina [name], OTR/L,CBIS  Supplemental OT - [facility] and [facility] Secure Chat Preferred    Thorsten Climer, Gina BIRCH 04/15/2024, 4:37 PM

## 2024-04-15 NOTE — TOC Progression Note (Signed)
 Transition of Care [facility]) - Progression Note    Patient Details  Name: Gina [name] MRN: [id number] Date of Birth: 1933-04-07  Transition of Care [facility]) CM/SW Contact  [name], RN Phone Number: 04/15/2024, 2:02 PM  Clinical Narrative:    CM spoke with [name] (dlt) ;Chose [facility]; [facility] updated of bed offer and insurance auth started, [place] ID [id number].   Expected Discharge Plan: Skilled Nursing Facility Barriers to Discharge: Continued Medical Work up, SNF Pending bed offer               Expected Discharge Plan and Services In-house Referral: NA Discharge Planning Services: CM Consult   Living arrangements for the past 2 months: Single Family Home                 DME Arranged: N/A DME Agency: NA       HH Arranged: NA HH Agency: NA         Social Drivers of Health (SDOH) Interventions SDOH Screenings   Food Insecurity: No Food Insecurity (04/11/2024)  Housing: Low Risk (04/11/2024)  Transportation Needs: No Transportation Needs (04/11/2024)  Utilities: Not At Risk (04/11/2024)  Social Connections: Moderately Integrated (04/11/2024)  Tobacco Use: Low Risk (04/09/2024)    Readmission Risk Interventions     No data to display

## 2024-04-15 NOTE — Progress Notes (Signed)
 Mobility Specialist Progress Note:   04/15/24 1508  Mobility  Activity  (Bed Exercises)  Level of Assistance Independent  Range of Motion/Exercises Active  Activity Response Tolerated well  Mobility Referral Yes  Mobility visit 1 Mobility  Mobility Specialist Start Time (ACUTE ONLY) 1452  Mobility Specialist Stop Time (ACUTE ONLY) 1502  Mobility Specialist Time Calculation (min) (ACUTE ONLY) 10 min   Pt was received in bed and agreed to mobility. Pt stated not being up for ambulation, opted for bed exercises: Seated BLE Exercises:  1) Toe Raise/ Heel Raise: 1 x 5 each leg  2) Ankle Pumps: 1 x 5 each leg  3) Marching: 1 x 5 each leg  4) Hip Adduction (pillow squeezes): 1 x 10  Pt had no complaints/issues during end of session, returned to bed with all needs met. Call bell in reach.  [employer] - Mobility Specialist

## 2024-04-16 DIAGNOSIS — G9341 Metabolic encephalopathy: Secondary | ICD-10-CM | POA: Diagnosis not present

## 2024-04-16 NOTE — Progress Notes (Signed)
 Tried calling report X's 2 to [facility] at [phone]. No one answered, message was left on voice mail for them to call me back for report.   [facility] just arrived to pick up patient.

## 2024-04-16 NOTE — Plan of Care (Signed)
" °  Problem: Education: Goal: Knowledge of General Education information will improve Description: Including pain rating scale, medication(s)/side effects and non-pharmacologic comfort measures 04/16/2024 1127 by Reesa Burnard HERO, RN Outcome: Adequate for Discharge 04/16/2024 0823 by Reesa Burnard HERO, RN Outcome: Progressing   Problem: Health Behavior/Discharge Planning: Goal: Ability to manage health-related needs will improve 04/16/2024 1127 by Reesa Burnard HERO, RN Outcome: Adequate for Discharge 04/16/2024 0823 by Reesa Burnard HERO, RN Outcome: Progressing   Problem: Clinical Measurements: Goal: Ability to maintain clinical measurements within normal limits will improve 04/16/2024 1127 by Hogan-Nutting, Burnard HERO, RN Outcome: Adequate for Discharge 04/16/2024 9176 by Reesa Burnard HERO, RN Outcome: Progressing Goal: Will remain free from infection 04/16/2024 1127 by Reesa Burnard HERO, RN Outcome: Adequate for Discharge 04/16/2024 9176 by Reesa Burnard HERO, RN Outcome: Progressing Goal: Diagnostic test results will improve 04/16/2024 1127 by Reesa Burnard HERO, RN Outcome: Adequate for Discharge 04/16/2024 9176 by Reesa Burnard HERO, RN Outcome: Progressing Goal: Respiratory complications will improve 04/16/2024 1127 by Reesa Burnard HERO, RN Outcome: Adequate for Discharge 04/16/2024 9176 by Reesa Burnard HERO, RN Outcome: Progressing Goal: Cardiovascular complication will be avoided 04/16/2024 1127 by Reesa Burnard HERO, RN Outcome: Adequate for Discharge 04/16/2024 9176 by Reesa Burnard HERO, RN Outcome: Progressing   Problem: Activity: Goal: Risk for activity intolerance will decrease 04/16/2024 1127 by Hogan-Nutting, Burnard HERO, RN Outcome: Adequate for Discharge 04/16/2024 9176 by Reesa Burnard HERO, RN Outcome: Progressing   Problem: Nutrition: Goal: Adequate nutrition will be maintained 04/16/2024 1127 by Reesa Burnard HERO,  RN Outcome: Adequate for Discharge 04/16/2024 0823 by Reesa Burnard HERO, RN Outcome: Progressing   Problem: Coping: Goal: Level of anxiety will decrease 04/16/2024 1127 by Reesa Burnard HERO, RN Outcome: Adequate for Discharge 04/16/2024 0823 by Reesa Burnard HERO, RN Outcome: Progressing   Problem: Elimination: Goal: Will not experience complications related to bowel motility 04/16/2024 1127 by Reesa Burnard HERO, RN Outcome: Adequate for Discharge 04/16/2024 9176 by Reesa Burnard HERO, RN Outcome: Progressing Goal: Will not experience complications related to urinary retention 04/16/2024 1127 by Reesa Burnard HERO, RN Outcome: Adequate for Discharge 04/16/2024 0823 by Reesa Burnard HERO, RN Outcome: Progressing   Problem: Pain Managment: Goal: General experience of comfort will improve and/or be controlled 04/16/2024 1127 by Reesa Burnard HERO, RN Outcome: Adequate for Discharge 04/16/2024 9176 by Reesa Burnard HERO, RN Outcome: Progressing   Problem: Safety: Goal: Ability to remain free from injury will improve 04/16/2024 1127 by Reesa Burnard HERO, RN Outcome: Adequate for Discharge 04/16/2024 9176 by Reesa Burnard HERO, RN Outcome: Progressing   Problem: Skin Integrity: Goal: Risk for impaired skin integrity will decrease 04/16/2024 1127 by Reesa Burnard HERO, RN Outcome: Adequate for Discharge 04/16/2024 9176 by Reesa Burnard HERO, RN Outcome: Progressing   "

## 2024-04-16 NOTE — Progress Notes (Signed)
 Susanna, RN called back for report.

## 2024-04-16 NOTE — TOC Transition Note (Signed)
 Transition of Care [facility]) - Discharge Note   Patient Details  Name: Gina [name] MRN: [id number] Date of Birth: 07/13/1932  [facility]) CM/SW Contact:  [name], RN Phone Number: 04/16/2024, 10:28 AM   Clinical Narrative:    Per MD patient ready for DC to [facility] . RN to call report prior to discharge (). RN, patient, patient's family, and facility notified of DC. Discharge Summary and FL2 sent to facility via [email or web address] packet on chart includes face sheet, medical necessity, DNR and 1 prescription. Ambulance [facility] transport requested for patient.  [name] updated on pick up time.  IP CM signing off. Please consult us  again if new needs arise.     Final next level of care: Skilled Nursing Facility Barriers to Discharge: Barriers Resolved   Patient Goals and CMS Choice Patient states their goals for this hospitalization and ongoing recovery are:: [facility] per [email or web address].gov Compare Post Acute Care list provided to:: Patient Represenative (must comment) Choice offered to / list presented to : Adult Children [place] ownership interest in [facility].provided to:: Adult Children    Discharge Placement   Existing PASRR number confirmed : 04/16/24          Patient chooses bed at: [facility] Patient to be transferred to facility by: [facility] Name of family member notified: [name] Patient and family notified of of transfer: 04/16/24  Discharge Plan and Services Additional resources added to the After Visit Summary for   In-house Referral: NA Discharge Planning Services: CM Consult            DME Arranged: N/A DME Agency: NA       HH Arranged: NA HH Agency: NA        Social Drivers of Health (SDOH) Interventions SDOH Screenings   Food Insecurity: No Food Insecurity (04/11/2024)  Housing: Low Risk (04/11/2024)  Transportation Needs: No Transportation Needs (04/11/2024)  Utilities: Not At Risk (04/11/2024)  Social Connections: Moderately  Integrated (04/11/2024)  Tobacco Use: Low Risk (04/09/2024)     Readmission Risk Interventions     No data to display

## 2024-04-16 NOTE — Discharge Summary (Signed)
 Physician Discharge Summary  Gina [name] FMW:[id number] DOB: 1932-06-06 DOA: 04/09/2024  PCP: Teresa Thersia Jansky, MD  Admit date: 04/09/2024 Discharge date: 04/16/2024  Admitted From: Home Disposition: Skilled nursing facility  Recommendations for Outpatient Follow-up:  Follow up with PCP in 1-2 weeks after discharge Please obtain BMP/CBC in one week   Home Health: N/A Equipment/Devices: N/A  Discharge Condition: Stable CODE STATUS: DNR/DNI Diet recommendation: Regular diet, nutritional supplements  Discharge summary: 89 y.o. female with medical history significant for paroxysmal SVT, GERD, left renal mass who presented to the ED for evaluation of altered mental status . She lives with her daughter and recently having difficulty with behaviors and recurrent UTI.  Found to have acute UTI.  Admitted due to significant symptoms.  Stabilized.  Going to SNF today.  She had some episodes of agitation but remains stable now.   Acute metabolic encephalopathy in a patient with early dementia Acute UTI present on admission   Clinically improving.  Urine culture with Enteroccus.  Completed 7 days of antibiotic therapy today. No focal neurological deficits. Will need to work with PT OT. Patient also has painful left hand and also joints.  She will use tramadol  and Voltaren  gel with good response.  Prescribed. Previously on low-dose Depakote  at night that will be continued.   GERD: On PPI.   Left renal mass: Recently diagnosed.  Elected not to pursue biopsy.  Surveillance recommended.    Hospital-acquired delirium: Fall precautions.  Delirium precautions.  Stable today. Medically stable to transition to a SNF level of care.  Discharge Diagnoses:  Principal Problem:   Acute metabolic encephalopathy Active Problems:   UTI (urinary tract infection)    Discharge Instructions  Discharge Instructions     Diet general   Complete by: As directed    Increase activity slowly    Complete by: As directed    No wound care   Complete by: As directed       Allergies as of 04/16/2024       Reactions   Codeine Other (See Comments)   Insomnia   Strawberry Extract Hives        Medication List     TAKE these medications    acetaminophen  500 MG tablet Commonly known as: TYLENOL  Take 1,000 mg by mouth daily as needed for moderate pain (pain score 4-6) or mild pain (pain score 1-3).   aspirin  EC 81 MG tablet Take 81 mg by mouth daily.   carboxymethylcellulose 0.5 % Soln Commonly known as: REFRESH PLUS Place 1 drop into both eyes daily.   diclofenac  Sodium 1 % Gel Commonly known as: VOLTAREN  Apply 2 g topically 4 (four) times daily.   divalproex  125 MG DR tablet Commonly known as: DEPAKOTE  Take 125 mg by mouth every evening.   docusate sodium  100 MG capsule Commonly known as: COLACE Take 100 mg by mouth every evening.   omeprazole 20 MG capsule Commonly known as: PRILOSEC Take 20 mg by mouth daily.   PRO-STAT PO Take 30 mLs by mouth every evening.   traMADol  50 MG tablet Commonly known as: ULTRAM  Take 1 tablet (50 mg total) by mouth every 6 (six) hours as needed for up to 5 days for moderate pain (pain score 4-6) or severe pain (pain score 7-10). What changed: when to take this        Contact information for after-discharge care     Destination     [facility] .   Service: Skilled Nursing Contact information: [address] [place]  [address] [phone]                    Allergies[1]  Consultations: None   Procedures/Studies: DG Shoulder Left Result Date: 04/09/2024 EXAM: 1 VIEW(S) XRAY OF THE LEFT SHOULDER 04/09/2024 09:36:00 PM COMPARISON: None available. CLINICAL HISTORY: fall FINDINGS: BONES AND JOINTS: Imaging is limited by suboptimal positioning. Slight inferior subluxation of humeral head in relation to glenoid. Degenerative changes. No acute fracture. The [facility] joint is unremarkable. SOFT  TISSUES: Atherosclerotic plaque. No abnormal calcifications. Visualized lung is unremarkable. IMPRESSION: 1. No acute fracture or dislocation. 2. Slight inferior subluxation of the humeral head in relation to the glenoid. 3. Atherosclerotic plaque. 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 No acute fracture. No malalignment. Small suprapatellar knee joint effusion. SOFT TISSUES: Vascular calcifications. IMPRESSION: 1. Small suprapatellar knee joint effusion. 2. 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 3. Vascular calcifications. Electronically signed by: Dorethia Molt MD 04/09/2024 10:18 PM EST RP Workstation: [id number]   DG Chest Port 1 View Result Date: 04/09/2024 EXAM: 1 VIEW(S) XRAY OF THE CHEST 04/09/2024 08:40:00 PM COMPARISON: 02/14/2024 CLINICAL HISTORY: Questionable sepsis - evaluate for abnormality FINDINGS: LUNGS AND PLEURA: Low lung volume. Elevated right hemidiaphragm. No focal pulmonary opacity. No pleural effusion. No pneumothorax. HEART AND MEDIASTINUM: Aortic arch calcifications. No acute abnormality of the cardiac and mediastinal silhouettes. BONES AND SOFT TISSUES: Right shoulder replacement noted. No acute osseous abnormality. IMPRESSION: 1. No acute findings. Electronically signed by: Franky Stanford MD 04/09/2024 09:25 PM EST RP Workstation: [id number]   CT HEAD WO CONTRAST ( ) Result Date: 04/09/2024 EXAM: CT HEAD WITHOUT CONTRAST 04/09/2024 09:10:00 PM TECHNIQUE: CT of the head was performed without the administration of intravenous contrast. Automated exposure control, iterative reconstruction, and/or weight based adjustment of the mA/kV was utilized to  reduce the radiation dose to as low as reasonably achievable. COMPARISON: 10/14/2023 CLINICAL HISTORY: Mental status change, unknown cause FINDINGS: BRAIN AND VENTRICLES: No acute hemorrhage. No evidence of acute infarct. No hydrocephalus. No extra-axial collection. No mass effect or midline shift. Stable atrophy and chronic small vessel ischemia. ORBITS: Right cataract resection noted. SINUSES: No acute abnormality. SOFT TISSUES AND SKULL: No acute soft tissue abnormality. No skull fracture. Atherosclerosis of skullbase vasculature. IMPRESSION: 1. No acute intracranial abnormality. 2. Stable atrophy and chronic small vessel ischemia. Electronically signed by: Franky Stanford MD 04/09/2024 09:17 PM EST RP Workstation: [id number]   (Echo, Carotid, EGD, Colonoscopy, ERCP)    Subjective: Patient seen in the morning rounds.  No overnight events.  She was just unhappy to talk to me.   Discharge Exam: Vitals:   04/15/24 2110 04/16/24 0603  BP: 127/61 136/67  Pulse: 66 (!) 55  Resp:    Temp: 97.9 F (36.6 C)   SpO2: 99% 98%   Vitals:   04/14/24 2014 04/15/24 1331 04/15/24 2110 04/16/24 0603  BP: (!) 112/52 109/65 127/61 136/67  Pulse: 76 79 66 (!) 55  Resp: 16 18    Temp: 97.8 F (36.6 C) (!) 97.5 F (36.4 C) 97.9 F (36.6 C)   TempSrc:  Oral Oral   SpO2: 97% 96% 99% 98%    General: Pt is alert, awake, not in acute  distress Patient is oriented to herself and family.  She is usually unhappy on interview.  Hard of hearing.  Does not like to be interviewed.  No focal deficits.  Gross generalized weakness. Cardiovascular: RRR, S1/S2 +, no rubs, no gallops Respiratory: CTA bilaterally, no wheezing, no rhonchi Abdominal: Soft, NT, ND, bowel sounds + Extremities: no edema, no cyanosis    The results of significant diagnostics from this hospitalization (including imaging, microbiology, ancillary and laboratory) are listed below for reference.     Microbiology: Recent Results (from the past  240 hours)  Resp panel by RT-PCR (RSV, Flu A&B, Covid) Anterior Nasal Swab     Status: None   Collection Time: 04/09/24  8:49 PM   Specimen: Anterior Nasal Swab  Result Value Ref Range Status   SARS Coronavirus 2 by RT PCR NEGATIVE NEGATIVE Final    Comment: (NOTE) SARS-CoV-2 target nucleic acids are NOT DETECTED.  The SARS-CoV-2 RNA is generally detectable in upper respiratory specimens during the acute phase of infection. The lowest concentration of SARS-CoV-2 viral copies this assay can detect is 138 copies/mL. A negative result does not preclude SARS-Cov-2 infection and should not be used as the sole basis for treatment or other patient management decisions. A negative result may occur with  improper specimen collection/handling, submission of specimen other than nasopharyngeal swab, presence of viral mutation(s) within the areas targeted by this assay, and inadequate number of viral copies(<138 copies/mL). A negative result must be combined with clinical observations, patient history, and epidemiological information. The expected result is Negative.  Fact Sheet for Patients:  bloggercourse.com  Fact Sheet for Healthcare Providers:  seriousbroker.it  This test is no t yet approved or cleared by the [place]  FDA and  has been authorized for detection and/or diagnosis of SARS-CoV-2 by FDA under an Emergency Use Authorization (EUA). This EUA will remain  in effect (meaning this test can be used) for the duration of the COVID-19 declaration under Section 564(b)(1) of the Act, 21 U.S.C.section 360bbb-3(b)(1), unless the authorization is terminated  or revoked sooner.       Influenza A by PCR NEGATIVE NEGATIVE Final   Influenza B by PCR NEGATIVE NEGATIVE Final    Comment: (NOTE) The Xpert Xpress SARS-CoV-2/FLU/RSV plus assay is intended as an aid in the diagnosis of influenza from Nasopharyngeal swab specimens and should  not be used as a sole basis for treatment. Nasal washings and aspirates are unacceptable for Xpert Xpress SARS-CoV-2/FLU/RSV testing.  Fact Sheet for Patients: bloggercourse.com  Fact Sheet for Healthcare Providers: seriousbroker.it  This test is not yet approved or cleared by the [place]  FDA and has been authorized for detection and/or diagnosis of SARS-CoV-2 by FDA under an Emergency Use Authorization (EUA). This EUA will remain in effect (meaning this test can be used) for the duration of the COVID-19 declaration under Section 564(b)(1) of the Act, 21 U.S.C. section 360bbb-3(b)(1), unless the authorization is terminated or revoked.     Resp Syncytial Virus by PCR NEGATIVE NEGATIVE Final    Comment: (NOTE) Fact Sheet for Patients: bloggercourse.com  Fact Sheet for Healthcare Providers: seriousbroker.it  This test is not yet approved or cleared by the [place]  FDA and has been authorized for detection and/or diagnosis of SARS-CoV-2 by FDA under an Emergency Use Authorization (EUA). This EUA will remain in effect (meaning this test can be used) for the duration of the COVID-19 declaration under Section 564(b)(1) of the Act, 21 U.S.C. section 360bbb-3(b)(1), unless the authorization is terminated or  revoked.  Performed at [facility], [address], [place], [place] [address]   Blood Culture (routine x 2)     Status: None   Collection Time: 04/09/24  8:49 PM   Specimen: BLOOD RIGHT ARM  Result Value Ref Range Status   Specimen Description   Final    BLOOD RIGHT ARM Performed at [facility], [address] [place], [place] [address]    Special Requests   Final    BOTTLES DRAWN AEROBIC AND ANAEROBIC Blood Culture results may not be optimal due to an inadequate volume of blood received in culture bottles Performed at [facility], [address], [place], [place] [address]    Culture   Final    NO GROWTH 5 DAYS Performed at [facility], [address] [place], [place] [address]    Report Status 04/15/2024 FINAL  Final  Urine Culture     Status: Abnormal   Collection Time: 04/09/24  9:52 PM   Specimen: Urine, Random  Result Value Ref Range Status   Specimen Description   Final    URINE, RANDOM Performed at [facility], [address], [place], [place] [address]    Special Requests   Final    NONE Reflexed from [phone] Performed at [facility], [address] [place], [place] [address]    Culture >=100,000 COLONIES/mL ENTEROCOCCUS FAECALIS (A)  Final   Report Status 04/12/2024 FINAL  Final   Organism ID, Bacteria ENTEROCOCCUS FAECALIS (A)  Final      Susceptibility   Enterococcus faecalis - MIC*    AMPICILLIN <=2 SENSITIVE Sensitive     NITROFURANTOIN <=16 SENSITIVE Sensitive     VANCOMYCIN 1 SENSITIVE Sensitive     * >=100,000 COLONIES/mL ENTEROCOCCUS FAECALIS  Blood Culture (routine x 2)     Status: None   Collection Time: 04/10/24  5:03 AM   Specimen: BLOOD LEFT HAND  Result Value Ref Range Status   Specimen Description   Final    BLOOD LEFT HAND Performed at [facility], [address] [place], [place] [address]    Special Requests   Final    BOTTLES DRAWN AEROBIC ONLY Blood Culture results may not be optimal due to an inadequate volume of blood received in culture bottles Performed at [facility], [address], [place], [place] [address]    Culture   Final    NO GROWTH 5 DAYS Performed at [facility], [address] [place], [place] [address]    Report Status 04/15/2024 FINAL  Final     Labs: BNP (last 3 results) No results for input(s): BNP in the last 8760 hours. Basic Metabolic Panel: Recent Labs  Lab 04/09/24 2049 04/10/24 0503  NA 135 137  K 4.3 4.7  CL 102 107  CO2 24  22  GLUCOSE 90 80  BUN 25* 20  CREATININE 0.74 0.71  CALCIUM 10.1 9.8   Liver Function Tests: Recent Labs  Lab 04/09/24 2049  AST 27  ALT 7  ALKPHOS 95  BILITOT 0.8  PROT 6.7  ALBUMIN 3.6   No results for input(s): LIPASE, AMYLASE in the last 168 hours. No results for input(s): AMMONIA in the last 168 hours. CBC: Recent Labs  Lab 04/09/24 2049 04/10/24 0503  WBC 8.0 6.6  NEUTROABS 6.1  --   HGB 14.0 12.2  HCT 41.4 36.5  MCV 97.0 97.1  PLT  224 163   Cardiac Enzymes: No results for input(s): CKTOTAL, CKMB, CKMBINDEX, TROPONINI in the last 168 hours. BNP: Invalid input(s): POCBNP CBG: Recent Labs  Lab 04/12/24 1844  GLUCAP 104*   D-Dimer No results for input(s): DDIMER in the last 72 hours. Hgb A1c No results for input(s): HGBA1C in the last 72 hours. Lipid Profile No results for input(s): CHOL, HDL, LDLCALC, TRIG, CHOLHDL, LDLDIRECT in the last 72 hours. Thyroid  function studies No results for input(s): TSH, T4TOTAL, T3FREE, THYROIDAB in the last 72 hours.  Invalid input(s): FREET3 Anemia work up No results for input(s): VITAMINB12, FOLATE, FERRITIN, TIBC, IRON, RETICCTPCT in the last 72 hours. Urinalysis    Component Value Date/Time   COLORURINE YELLOW 04/09/2024 2152   APPEARANCEUR HAZY (A) 04/09/2024 2152   LABSPEC 1.018 04/09/2024 2152   PHURINE 7.0 04/09/2024 2152   GLUCOSEU NEGATIVE 04/09/2024 2152   HGBUR NEGATIVE 04/09/2024 2152   BILIRUBINUR NEGATIVE 04/09/2024 2152   KETONESUR NEGATIVE 04/09/2024 2152   PROTEINUR NEGATIVE 04/09/2024 2152   UROBILINOGEN 0.2 07/11/2010 1400   NITRITE POSITIVE (A) 04/09/2024 2152   LEUKOCYTESUR LARGE (A) 04/09/2024 2152   Sepsis Labs Recent Labs  Lab 04/09/24 2049 04/10/24 0503  WBC 8.0 6.6   Microbiology Recent Results (from the past 240 hours)  Resp panel by RT-PCR (RSV, Flu A&B, Covid) Anterior Nasal Swab     Status: None   Collection Time:  04/09/24  8:49 PM   Specimen: Anterior Nasal Swab  Result Value Ref Range Status   SARS Coronavirus 2 by RT PCR NEGATIVE NEGATIVE Final    Comment: (NOTE) SARS-CoV-2 target nucleic acids are NOT DETECTED.  The SARS-CoV-2 RNA is generally detectable in upper respiratory specimens during the acute phase of infection. The lowest concentration of SARS-CoV-2 viral copies this assay can detect is 138 copies/mL. A negative result does not preclude SARS-Cov-2 infection and should not be used as the sole basis for treatment or other patient management decisions. A negative result may occur with  improper specimen collection/handling, submission of specimen other than nasopharyngeal swab, presence of viral mutation(s) within the areas targeted by this assay, and inadequate number of viral copies(<138 copies/mL). A negative result must be combined with clinical observations, patient history, and epidemiological information. The expected result is Negative.  Fact Sheet for Patients:  bloggercourse.com  Fact Sheet for Healthcare Providers:  seriousbroker.it  This test is no t yet approved or cleared by the [place]  FDA and  has been authorized for detection and/or diagnosis of SARS-CoV-2 by FDA under an Emergency Use Authorization (EUA). This EUA will remain  in effect (meaning this test can be used) for the duration of the COVID-19 declaration under Section 564(b)(1) of the Act, 21 U.S.C.section 360bbb-3(b)(1), unless the authorization is terminated  or revoked sooner.       Influenza A by PCR NEGATIVE NEGATIVE Final   Influenza B by PCR NEGATIVE NEGATIVE Final    Comment: (NOTE) The Xpert Xpress SARS-CoV-2/FLU/RSV plus assay is intended as an aid in the diagnosis of influenza from Nasopharyngeal swab specimens and should not be used as a sole basis for treatment. Nasal washings and aspirates are unacceptable for Xpert Xpress  SARS-CoV-2/FLU/RSV testing.  Fact Sheet for Patients: bloggercourse.com  Fact Sheet for Healthcare Providers: seriousbroker.it  This test is not yet approved or cleared by the [place]  FDA and has been authorized for detection and/or diagnosis of SARS-CoV-2 by FDA under an Emergency Use Authorization (EUA). This EUA will remain in effect (  meaning this test can be used) for the duration of the COVID-19 declaration under Section 564(b)(1) of the Act, 21 U.S.C. section 360bbb-3(b)(1), unless the authorization is terminated or revoked.     Resp Syncytial Virus by PCR NEGATIVE NEGATIVE Final    Comment: (NOTE) Fact Sheet for Patients: bloggercourse.com  Fact Sheet for Healthcare Providers: seriousbroker.it  This test is not yet approved or cleared by the [place]  FDA and has been authorized for detection and/or diagnosis of SARS-CoV-2 by FDA under an Emergency Use Authorization (EUA). This EUA will remain in effect (meaning this test can be used) for the duration of the COVID-19 declaration under Section 564(b)(1) of the Act, 21 U.S.C. section 360bbb-3(b)(1), unless the authorization is terminated or revoked.  Performed at [facility], [address], [place], [place] [address]   Blood Culture (routine x 2)     Status: None   Collection Time: 04/09/24  8:49 PM   Specimen: BLOOD RIGHT ARM  Result Value Ref Range Status   Specimen Description   Final    BLOOD RIGHT ARM Performed at [facility], [address], [place], [place] [address]    Special Requests   Final    BOTTLES DRAWN AEROBIC AND ANAEROBIC Blood Culture results may not be optimal due to an inadequate volume of blood received in culture bottles Performed at [facility], [address], [place], [place] [address]    Culture   Final    NO GROWTH 5  DAYS Performed at [facility], [address], [place], [place] [address]    Report Status 04/15/2024 FINAL  Final  Urine Culture     Status: Abnormal   Collection Time: 04/09/24  9:52 PM   Specimen: Urine, Random  Result Value Ref Range Status   Specimen Description   Final    URINE, RANDOM Performed at [facility], [address] [place], [place] [address]    Special Requests   Final    NONE Reflexed from [phone] Performed at [facility], [address] [place], [place] [address]    Culture >=100,000 COLONIES/mL ENTEROCOCCUS FAECALIS (A)  Final   Report Status 04/12/2024 FINAL  Final   Organism ID, Bacteria ENTEROCOCCUS FAECALIS (A)  Final      Susceptibility   Enterococcus faecalis - MIC*    AMPICILLIN <=2 SENSITIVE Sensitive     NITROFURANTOIN <=16 SENSITIVE Sensitive     VANCOMYCIN 1 SENSITIVE Sensitive     * >=100,000 COLONIES/mL ENTEROCOCCUS FAECALIS  Blood Culture (routine x 2)     Status: None   Collection Time: 04/10/24  5:03 AM   Specimen: BLOOD LEFT HAND  Result Value Ref Range Status   Specimen Description   Final    BLOOD LEFT HAND Performed at [facility], [address] [place], [place] [address]    Special Requests   Final    BOTTLES DRAWN AEROBIC ONLY Blood Culture results may not be optimal due to an inadequate volume of blood received in culture bottles Performed at [facility], [address] [place], [place] [address]    Culture   Final    NO GROWTH 5 DAYS Performed at [facility], [address], [place], [place] [address]    Report Status 04/15/2024 FINAL  Final     Time coordinating discharge: 32 minutes  SIGNED:   Renato Applebaum, MD  [facility] 04/16/2024, 10:14 AM     [  1]  Allergies Allergen Reactions   Codeine Other (See Comments)    Insomnia    Strawberry Extract Hives

## 2024-04-16 NOTE — TOC Progression Note (Signed)
 Transition of Care [facility]) - Progression Note    Patient Details  Name: Gina [name] MRN: [id number] Date of Birth: 10/30/1932  [facility] of [facility]) CM/SW Contact  [name], RN Phone Number: 04/16/2024, 9:07 AM  Clinical Narrative:    CM update Dr. Raenelle that insurance auth approved for [facility] and requested discharge summary.   Expected Discharge Plan: Skilled Nursing Facility Barriers to Discharge: Continued Medical Work up, SNF Pending bed offer               Expected Discharge Plan and Services In-house Referral: NA Discharge Planning Services: CM Consult   Living arrangements for the past 2 months: Single Family Home                 DME Arranged: N/A DME Agency: NA       HH Arranged: NA HH Agency: NA         Social Drivers of Health (SDOH) Interventions SDOH Screenings   Food Insecurity: No Food Insecurity (04/11/2024)  Housing: Low Risk (04/11/2024)  Transportation Needs: No Transportation Needs (04/11/2024)  Utilities: Not At Risk (04/11/2024)  Social Connections: Moderately Integrated (04/11/2024)  Tobacco Use: Low Risk (04/09/2024)    Readmission Risk Interventions     No data to display

## 2024-04-16 NOTE — Plan of Care (Signed)

## 2024-04-23 NOTE — Progress Notes (Signed)
 COVID flu and RSV test obtained since patient is confused and has a fever and may have sick contacts

## 2024-04-29 ENCOUNTER — Encounter (HOSPITAL_COMMUNITY): Payer: Self-pay | Admitting: Emergency Medicine

## 2024-04-29 ENCOUNTER — Emergency Department (HOSPITAL_COMMUNITY)

## 2024-04-29 ENCOUNTER — Observation Stay (HOSPITAL_COMMUNITY)
Admission: EM | Admit: 2024-04-29 | Discharge: 2024-05-01 | Disposition: A | Discharge status: Skilled Nursing Facility | Attending: Student | Admitting: Student

## 2024-04-29 ENCOUNTER — Other Ambulatory Visit: Payer: Self-pay

## 2024-04-29 DIAGNOSIS — I129 Hypertensive chronic kidney disease with stage 1 through stage 4 chronic kidney disease, or unspecified chronic kidney disease: Secondary | ICD-10-CM | POA: Insufficient documentation

## 2024-04-29 DIAGNOSIS — Z7982 Long term (current) use of aspirin: Secondary | ICD-10-CM | POA: Insufficient documentation

## 2024-04-29 DIAGNOSIS — Z79899 Other long term (current) drug therapy: Secondary | ICD-10-CM | POA: Diagnosis not present

## 2024-04-29 DIAGNOSIS — D631 Anemia in chronic kidney disease: Secondary | ICD-10-CM | POA: Diagnosis not present

## 2024-04-29 DIAGNOSIS — R531 Weakness: Secondary | ICD-10-CM | POA: Diagnosis not present

## 2024-04-29 DIAGNOSIS — Z8719 Personal history of other diseases of the digestive system: Secondary | ICD-10-CM | POA: Insufficient documentation

## 2024-04-29 DIAGNOSIS — N183 Chronic kidney disease, stage 3 unspecified: Secondary | ICD-10-CM | POA: Diagnosis not present

## 2024-04-29 DIAGNOSIS — G9341 Metabolic encephalopathy: Secondary | ICD-10-CM | POA: Diagnosis not present

## 2024-04-29 DIAGNOSIS — F0390 Unspecified dementia without behavioral disturbance: Secondary | ICD-10-CM | POA: Diagnosis not present

## 2024-04-29 DIAGNOSIS — N390 Urinary tract infection, site not specified: Secondary | ICD-10-CM | POA: Diagnosis not present

## 2024-04-29 DIAGNOSIS — D72829 Elevated white blood cell count, unspecified: Secondary | ICD-10-CM | POA: Diagnosis not present

## 2024-04-29 DIAGNOSIS — A419 Sepsis, unspecified organism: Secondary | ICD-10-CM | POA: Diagnosis not present

## 2024-04-29 DIAGNOSIS — R627 Adult failure to thrive: Secondary | ICD-10-CM | POA: Insufficient documentation

## 2024-04-29 DIAGNOSIS — R4182 Altered mental status, unspecified: Secondary | ICD-10-CM | POA: Diagnosis present

## 2024-04-29 LAB — CBC WITH DIFFERENTIAL/PLATELET
Abs Immature Granulocytes: 0.07 K/uL (ref 0.00–0.07)
Basophils Absolute: 0.1 K/uL (ref 0.0–0.1)
Basophils Relative: 0 %
Eosinophils Absolute: 0.2 K/uL (ref 0.0–0.5)
Eosinophils Relative: 1 %
HCT: 35.8 % — ABNORMAL LOW (ref 36.0–46.0)
Hemoglobin: 11.8 g/dL — ABNORMAL LOW (ref 12.0–15.0)
Immature Granulocytes: 0 %
Lymphocytes Relative: 2 %
Lymphs Abs: 0.3 K/uL — ABNORMAL LOW (ref 0.7–4.0)
MCH: 31.6 pg (ref 26.0–34.0)
MCHC: 33 g/dL (ref 30.0–36.0)
MCV: 96 fL (ref 80.0–100.0)
Monocytes Absolute: 0.8 K/uL (ref 0.1–1.0)
Monocytes Relative: 4 %
Neutro Abs: 17.2 K/uL — ABNORMAL HIGH (ref 1.7–7.7)
Neutrophils Relative %: 93 %
Platelets: 226 K/uL (ref 150–400)
RBC: 3.73 MIL/uL — ABNORMAL LOW (ref 3.87–5.11)
RDW: 12.6 % (ref 11.5–15.5)
WBC: 18.6 K/uL — ABNORMAL HIGH (ref 4.0–10.5)
nRBC: 0 % (ref 0.0–0.2)

## 2024-04-29 LAB — COMPREHENSIVE METABOLIC PANEL WITH GFR
ALT: 27 U/L (ref 0–44)
AST: 50 U/L — ABNORMAL HIGH (ref 15–41)
Albumin: 3.1 g/dL — ABNORMAL LOW (ref 3.5–5.0)
Alkaline Phosphatase: 76 U/L (ref 38–126)
Anion gap: 8 (ref 5–15)
BUN: 29 mg/dL — ABNORMAL HIGH (ref 8–23)
CO2: 23 mmol/L (ref 22–32)
Calcium: 9.7 mg/dL (ref 8.9–10.3)
Chloride: 106 mmol/L (ref 98–111)
Creatinine, Ser: 0.86 mg/dL (ref 0.44–1.00)
GFR, Estimated: >60 mL/min
Glucose, Bld: 80 mg/dL (ref 70–99)
Potassium: 3.8 mmol/L (ref 3.5–5.1)
Sodium: 138 mmol/L (ref 135–145)
Total Bilirubin: 0.9 mg/dL (ref 0.0–1.2)
Total Protein: 5.6 g/dL — ABNORMAL LOW (ref 6.5–8.1)

## 2024-04-29 LAB — RESP PANEL BY RT-PCR (RSV, FLU A&B, COVID)  RVPGX2
Influenza A by PCR: NEGATIVE
Influenza B by PCR: NEGATIVE
Resp Syncytial Virus by PCR: NEGATIVE
SARS Coronavirus 2 by RT PCR: NEGATIVE

## 2024-04-29 LAB — MAGNESIUM: Magnesium: 1.9 mg/dL (ref 1.7–2.4)

## 2024-04-29 LAB — I-STAT CG4 LACTIC ACID, ED: Lactic Acid, Venous: 1.7 mmol/L (ref 0.5–1.9)

## 2024-04-29 MED ORDER — SODIUM CHLORIDE 0.9 % IV BOLUS
1000.0000 mL | Freq: Once | INTRAVENOUS | Status: AC
  Administered 2024-04-29: 1000 mL via INTRAVENOUS

## 2024-04-29 NOTE — ED Triage Notes (Signed)
 Patient presents from [facility] nursing facility due to altered mental status. She has complained of dysuria, fever and a pressure ulcer. She has had a UTI for about 1 week and started antibiotics last night. Today they family noticed she was altered and presented similarly to how she has with past UTIs. She is A&O x 4. EMS administered 600 ml of LR/     HX Dementia  EMS vitals 88 HR  24 RR 105 CBG 100.3 temp

## 2024-04-29 NOTE — ED Provider Notes (Signed)
 " [place] EMERGENCY DEPARTMENT AT [facility] Provider Note   CSN: [id number] Arrival date & time: 04/29/24  2205     Patient presents with: No chief complaint on file.   Alanta Scobey is a 89 y.o. female.  {Add pertinent medical, surgical, social history, OB history to YEP:67052} The history is provided by the patient and medical records.   89 year old female with history of hypertension, GERD, chronic kidney disease, frequent UTIs, presenting to the ED with family with concern of altered mental status and generalized weakness.  She has had symptoms suspicious for UTI for the past week, was just started on antibiotics yesterday.  Daughter and husband were visiting her today and noticed that she seemed altered, was very weak and having trouble even standing to transfer from bed to chair.  This is consistent with her prior UTIs.  It seems like over the past few months she has had nearly constant UTIs.  She has not had any nausea or vomiting.  She is eating less than normal.  Was noted to have a fever today of 100.73F at facility.  No reported COVID or flu cases at facility currently per family.  Prior to Admission medications  Medication Sig Start Date End Date Taking? Authorizing Provider  acetaminophen  (TYLENOL ) 500 MG tablet Take 1,000 mg by mouth daily as needed for moderate pain (pain score 4-6) or mild pain (pain score 1-3).    [provider]  Amino Acids-Protein Hydrolys (PRO-STAT PO) Take 30 mLs by mouth every evening.    [provider]  aspirin  EC 81 MG tablet Take 81 mg by mouth daily.    [provider]  carboxymethylcellulose (REFRESH PLUS) 0.5 % SOLN Place 1 drop into both eyes daily.    [provider]  diclofenac  Sodium (VOLTAREN ) 1 % GEL Apply 2 g topically 4 (four) times daily. 04/13/24   Raenelle Coria, MD  divalproex  (DEPAKOTE ) 125 MG DR tablet Take 125 mg by mouth every evening.    [provider]  docusate sodium   (COLACE) 100 MG capsule Take 100 mg by mouth every evening.    [provider]  omeprazole (PRILOSEC) 20 MG capsule Take 20 mg by mouth daily. 04/05/20   [provider]    Allergies: Codeine and Strawberry extract    Review of Systems  Constitutional:  Positive for fever.  Neurological:  Positive for weakness.  All other systems reviewed and are negative.   Updated Vital Signs BP 98/68 (BP Location: Right Arm)   Pulse 80   Temp 98 F (36.7 C) (Oral)   Resp 15   SpO2 99%   Physical Exam Vitals and nursing note reviewed.  Constitutional:      Appearance: She is well-developed.     Comments: Elderly, hard of hearing  HENT:     Head: Normocephalic and atraumatic.     Mouth/Throat:     Comments: Mucous membranes are dry Eyes:     Conjunctiva/sclera: Conjunctivae normal.     Pupils: Pupils are equal, round, and reactive to light.  Cardiovascular:     Rate and Rhythm: Normal rate and regular rhythm.     Heart sounds: Normal heart sounds.  Pulmonary:     Effort: Pulmonary effort is normal.     Breath sounds: Normal breath sounds.  Abdominal:     General: Bowel sounds are normal.     Palpations: Abdomen is soft.  Musculoskeletal:        General: Normal range  of motion.     Cervical back: Normal range of motion.  Skin:    General: Skin is warm and dry.  Neurological:     Mental Status: She is alert and oriented to person, place, and time.     (all labs ordered are listed, but only abnormal results are displayed) Labs Reviewed - No data to display  EKG: None  Radiology: No results found.  {Document cardiac monitor, telemetry assessment procedure when appropriate:32947} Procedures   Medications Ordered in the ED - No data to display    {Click here for ABCD2, HEART and other calculators REFRESH Note before signing:1}                              Medical Decision Making Amount and/or Complexity of Data Reviewed Labs: ordered. Radiology:  ordered. ECG/medicine tests: ordered.   ***  {Document critical care time when appropriate  Document review of labs and clinical decision tools ie CHADS2VASC2, etc  Document your independent review of radiology images and any outside records  Document your discussion with family members, caretakers and with consultants  Document social determinants of health affecting pt's care  Document your decision making why or why not admission, treatments were needed:32947:::1}   Final diagnoses:  None    ED Discharge Orders     None        "

## 2024-04-30 ENCOUNTER — Encounter (HOSPITAL_COMMUNITY): Payer: Self-pay | Admitting: Family Medicine

## 2024-04-30 DIAGNOSIS — N3000 Acute cystitis without hematuria: Secondary | ICD-10-CM | POA: Diagnosis not present

## 2024-04-30 DIAGNOSIS — A419 Sepsis, unspecified organism: Secondary | ICD-10-CM

## 2024-04-30 LAB — URINALYSIS, W/ REFLEX TO CULTURE (INFECTION SUSPECTED)
Bacteria, UA: NONE SEEN
Bilirubin Urine: NEGATIVE
Glucose, UA: NEGATIVE mg/dL
Hgb urine dipstick: NEGATIVE
Ketones, ur: NEGATIVE mg/dL
Nitrite: NEGATIVE
Protein, ur: NEGATIVE mg/dL
Specific Gravity, Urine: 1.017 (ref 1.005–1.030)
pH: 6 (ref 5.0–8.0)

## 2024-04-30 LAB — CBC
HCT: 34.6 % — ABNORMAL LOW (ref 36.0–46.0)
Hemoglobin: 11.9 g/dL — ABNORMAL LOW (ref 12.0–15.0)
MCH: 32.2 pg (ref 26.0–34.0)
MCHC: 34.4 g/dL (ref 30.0–36.0)
MCV: 93.8 fL (ref 80.0–100.0)
Platelets: 218 K/uL (ref 150–400)
RBC: 3.69 MIL/uL — ABNORMAL LOW (ref 3.87–5.11)
RDW: 12.7 % (ref 11.5–15.5)
WBC: 14.1 K/uL — ABNORMAL HIGH (ref 4.0–10.5)
nRBC: 0 % (ref 0.0–0.2)

## 2024-04-30 LAB — COMPREHENSIVE METABOLIC PANEL WITH GFR
ALT: 23 U/L (ref 0–44)
AST: 43 U/L — ABNORMAL HIGH (ref 15–41)
Albumin: 3 g/dL — ABNORMAL LOW (ref 3.5–5.0)
Alkaline Phosphatase: 67 U/L (ref 38–126)
Anion gap: 10 (ref 5–15)
BUN: 27 mg/dL — ABNORMAL HIGH (ref 8–23)
CO2: 20 mmol/L — ABNORMAL LOW (ref 22–32)
Calcium: 9.7 mg/dL (ref 8.9–10.3)
Chloride: 108 mmol/L (ref 98–111)
Creatinine, Ser: 0.84 mg/dL (ref 0.44–1.00)
GFR, Estimated: >60 mL/min
Glucose, Bld: 75 mg/dL (ref 70–99)
Potassium: 3.6 mmol/L (ref 3.5–5.1)
Sodium: 138 mmol/L (ref 135–145)
Total Bilirubin: 0.7 mg/dL (ref 0.0–1.2)
Total Protein: 5.5 g/dL — ABNORMAL LOW (ref 6.5–8.1)

## 2024-04-30 MED ORDER — SODIUM CHLORIDE 0.9 % IV SOLN: INTRAVENOUS | Status: DC

## 2024-04-30 MED ORDER — ENOXAPARIN SODIUM 40 MG/0.4ML IJ SOSY
40.0000 mg | PREFILLED_SYRINGE | INTRAMUSCULAR | Status: DC
  Administered 2024-04-30 – 2024-05-01 (×2): 40 mg via SUBCUTANEOUS
  Filled 2024-04-30 (×2): qty 0.4

## 2024-04-30 MED ORDER — ACETAMINOPHEN 650 MG RE SUPP: 650.0000 mg | Freq: Four times a day (QID) | RECTAL | Status: DC | PRN

## 2024-04-30 MED ORDER — TRAZODONE HCL 50 MG PO TABS: 25.0000 mg | ORAL_TABLET | Freq: Every evening | ORAL | Status: DC | PRN

## 2024-04-30 MED ORDER — ACETAMINOPHEN 325 MG PO TABS
650.0000 mg | ORAL_TABLET | Freq: Four times a day (QID) | ORAL | Status: DC | PRN
  Administered 2024-04-30 – 2024-05-01 (×4): 650 mg via ORAL
  Filled 2024-04-30 (×4): qty 2

## 2024-04-30 MED ORDER — SENNOSIDES-DOCUSATE SODIUM 8.6-50 MG PO TABS: 1.0000 | ORAL_TABLET | Freq: Every evening | ORAL | Status: DC | PRN

## 2024-04-30 MED ORDER — HYDRALAZINE HCL 20 MG/ML IJ SOLN: 5.0000 mg | Freq: Four times a day (QID) | INTRAMUSCULAR | Status: DC | PRN

## 2024-04-30 MED ORDER — ONDANSETRON HCL 4 MG PO TABS: 4.0000 mg | ORAL_TABLET | Freq: Four times a day (QID) | ORAL | Status: DC | PRN

## 2024-04-30 MED ORDER — DIVALPROEX SODIUM 125 MG PO DR TAB
125.0000 mg | DELAYED_RELEASE_TABLET | Freq: Every evening | ORAL | Status: DC
  Administered 2024-04-30: 125 mg via ORAL
  Filled 2024-04-30 (×2): qty 1

## 2024-04-30 MED ORDER — POLYVINYL ALCOHOL 1.4 % OP SOLN
1.0000 [drp] | Freq: Every day | OPHTHALMIC | Status: DC
  Administered 2024-04-30: 1 [drp] via OPHTHALMIC
  Filled 2024-04-30: qty 15

## 2024-04-30 MED ORDER — PROSOURCE PLUS PO LIQD
30.0000 mL | Freq: Every evening | ORAL | Status: DC
  Administered 2024-04-30: 30 mL via ORAL

## 2024-04-30 MED ORDER — SODIUM CHLORIDE 0.9 % IV SOLN
1.0000 g | Freq: Once | INTRAVENOUS | Status: AC
  Administered 2024-04-30: 1 g via INTRAVENOUS
  Filled 2024-04-30: qty 10

## 2024-04-30 MED ORDER — BISACODYL 5 MG PO TBEC: 5.0000 mg | DELAYED_RELEASE_TABLET | Freq: Every day | ORAL | Status: DC | PRN

## 2024-04-30 MED ORDER — SODIUM CHLORIDE 0.9% FLUSH
3.0000 mL | Freq: Two times a day (BID) | INTRAVENOUS | Status: DC
  Administered 2024-04-30 – 2024-05-01 (×3): 3 mL via INTRAVENOUS

## 2024-04-30 MED ORDER — ASPIRIN 81 MG PO TBEC
81.0000 mg | DELAYED_RELEASE_TABLET | Freq: Every day | ORAL | Status: DC
  Administered 2024-04-30 – 2024-05-01 (×2): 81 mg via ORAL
  Filled 2024-04-30 (×2): qty 1

## 2024-04-30 MED ORDER — ONDANSETRON HCL 4 MG/2ML IJ SOLN: 4.0000 mg | Freq: Four times a day (QID) | INTRAMUSCULAR | Status: DC | PRN

## 2024-04-30 MED ORDER — PANTOPRAZOLE SODIUM 40 MG PO TBEC
40.0000 mg | DELAYED_RELEASE_TABLET | Freq: Every day | ORAL | Status: DC
  Administered 2024-04-30 – 2024-05-01 (×2): 40 mg via ORAL
  Filled 2024-04-30 (×2): qty 1

## 2024-04-30 MED ORDER — SODIUM CHLORIDE 0.9 % IV SOLN
1.0000 g | INTRAVENOUS | Status: DC
  Administered 2024-05-01: 1 g via INTRAVENOUS
  Filled 2024-04-30: qty 10

## 2024-04-30 NOTE — Progress Notes (Signed)
 Spoke with daughter [name] No. Provided daughter w/update. Questions asked and answered.

## 2024-04-30 NOTE — Evaluation (Signed)
 Physical Therapy Evaluation Patient Details Name: Gina [name] MRN: [id number] DOB: 1932-08-14 Today's Date: 04/30/2024  History of Present Illness  Gina [name] is a 89 y.o. female presents to therapy following hospital admission on 04/29/2024 due to AMS, generalized weakness increased difficulty with ADLs, fever and poor PO intake. Pt was admitted earlier this month due to UTI, metabolic encephalopathy and AMS. Pt was found to have UTI. 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  Clinical Impression  Pt admitted with above diagnosis.  Pt currently with functional limitations due to the deficits listed below (see PT Problem List). Pt in bed when PT arrived. Pt yelling out that she needs to use the bathroom. Pt required cues for safety, with increased time for all motor processing and planning, pt is oriented to self and reported needing to call her mother and reported that her mother's name is the same as hers. Pt required S and set up for supine to sit, CGA for sit to stand  from bed and commode with RW, gait tasks 20 feet x 2 with RW, CGA and cues and CGA to return to bed. Pt left in bed, all needs in place. Patient will benefit from continued inpatient follow up therapy, <3 hours/day.  Pt will benefit from acute skilled PT to increase their independence and safety with mobility to allow discharge.         If plan is discharge home, recommend the following: A little help with walking and/or transfers;A little help with bathing/dressing/bathroom;Help with stairs or ramp for entrance;Assist for transportation;Assistance with cooking/housework;Supervision due to cognitive status   Can travel by private vehicle   Yes    Equipment Recommendations None recommended by PT  Recommendations for Other Services        Functional Status Assessment Patient has had a recent decline in their functional status and demonstrates the ability to make significant improvements in function in a reasonable and predictable amount of time.     Precautions / Restrictions Precautions Precautions: Fall Recall of Precautions/Restrictions: Impaired Restrictions Weight Bearing Restrictions Per Provider Order: No      Mobility  Bed Mobility Overal bed mobility: Needs Assistance Bed Mobility: Supine to Sit, Sit to Supine     Supine to sit: Supervision Sit to supine: Contact guard assist   General bed mobility comments: cues, increased time and use of hospital bed features    Transfers Overall transfer level: Needs assistance Equipment used: Rolling walker (2 wheels) Transfers: Sit to/from Stand Sit to Stand: Contact guard assist           General transfer comment: cues for safety, RW placement and pt in a hurry to go to the bathroom    Ambulation/Gait Ambulation/Gait assistance: Contact guard assist Gait Distance (Feet): 20 Feet Assistive device: Rolling walker (2 wheels) Gait Pattern/deviations: Trunk flexed, Step-through pattern Gait velocity: decreased     General Gait Details: min cues for safety, obstacle navigation and posture  Stairs            Wheelchair Mobility     Tilt Bed    Modified Rankin (Stroke Patients Only)       Balance Overall balance assessment: Needs assistance, History of Falls Sitting-balance support: Feet supported Sitting balance-Leahy Scale: Good     Standing balance support: Bilateral upper extremity supported, During  functional activity, Reliant on assistive device for balance Standing balance-Leahy Scale: Fair Standing balance comment: static standing with clothing management and hand washing no overt LOB with wide BOS                             Pertinent Vitals/Pain Pain Assessment Pain Assessment: No/denies pain    Home Living  Family/patient expects to be discharged to:: Skilled nursing facility                   Additional Comments: pt is unable to provide info today; info regarding PLOF taken from chart review. prior to TKA pt was living with daughter in a 2 story home with 5 steps to enter and L handrail with B and B on first floor, pt was mod I with use of RW for household navigation and required some assist from daughter for spongebathing and toileting as well as household management and IADLs    Prior Function Prior Level of Function : Needs assist;History of Falls (last six months)             Mobility Comments: Mod I with RW until knee sx in 12/31/23; went to rehab afer TKA       Extremity/Trunk Assessment        Lower Extremity Assessment Lower Extremity Assessment: Generalized weakness    Cervical / Trunk Assessment Cervical / Trunk Assessment: Kyphotic  Communication   Communication Communication: Impaired Factors Affecting Communication: Hearing impaired    Cognition Arousal: Alert Behavior During Therapy: Agitated, Impulsive   PT - Cognitive impairments: No family/caregiver present to determine baseline, History of cognitive impairments                       PT - Cognition Comments: per chart suspect underlying dementia, not formally dx. pt oriented to self however was requesting to call her mother and stated that her mother's name was Gina [name]. Following commands: Impaired Following commands impaired: Follows one step commands inconsistently, Follows one step commands with increased time     Cueing Cueing Techniques: Verbal cues     General Comments      Exercises     Assessment/Plan    PT Assessment Patient needs continued PT services  PT Problem List Decreased strength;Decreased activity tolerance;Decreased knowledge of precautions;Decreased safety awareness;Decreased mobility;Decreased balance;Decreased cognition       PT Treatment  Interventions DME instruction;Therapeutic exercise;Gait training;Functional mobility training;Therapeutic activities;Patient/family education    PT Goals (Current goals can be found in the Care Plan section)  Acute Rehab PT Goals PT Goal Formulation: Patient unable to participate in goal setting    Frequency Min 2X/week     Co-evaluation               AM-PAC PT 6 Clicks Mobility  Outcome Measure Help needed turning from your back to your side while in a flat bed without using bedrails?: A Little Help needed moving from lying on your back to sitting on the side of a flat bed without using bedrails?: A Little Help needed moving to and from a bed to a chair (including a wheelchair)?: A Little Help needed standing up from a chair using your arms (e.g., wheelchair or bedside chair)?: A Little Help needed to walk in hospital room?: A Little Help needed climbing 3-5 steps with a railing? : A Lot 6 Click Score: 17    End of Session Equipment  Utilized During Treatment: Gait belt Activity Tolerance: Patient tolerated treatment well Patient left: with call bell/phone within reach;in bed;with bed alarm set Nurse Communication: Mobility status;Other (comment) (pt apparent confusion) PT Visit Diagnosis: Other abnormalities of gait and mobility (R26.89);Muscle weakness (generalized) (M62.81);Difficulty in walking, not elsewhere classified (R26.2)    Time: 8864-8840 PT Time Calculation (min) (ACUTE ONLY): 24 min   Charges:   PT Evaluation $PT Eval Low Complexity: 1 Low PT Treatments $Therapeutic Activity: 8-22 mins PT General Charges $$ ACUTE PT VISIT: 1 Visit         [place], PT Acute Rehab   [place] VEAR Drone 04/30/2024, 2:35 PM

## 2024-04-30 NOTE — Care Management Obs Status (Signed)
 MEDICARE OBSERVATION STATUS NOTIFICATION   Patient Details  Name: Reather Steller MRN: [id number] Date of Birth: [birth year]   Medicare Observation Status Notification Given:  Yes    [name], RN 04/30/2024, 10:46 AM

## 2024-04-30 NOTE — Progress Notes (Signed)
 " PROGRESS NOTE  Gina [name] FMW:[id number] DOB: 1932-10-05   PCP: Teresa Thersia Jansky, MD  Patient is from: [facility] nursing facility   DOA: 04/29/2024 LOS: 0  Chief complaints Chief Complaint  Patient presents with   Dysuria   Altered Mental Status     Brief Narrative / Interim history: 89 year old F with PMH of severe dementia, paroxysmal SVT, GERD, osteoarthritis and recurrent hospitalization for UTI and encephalopathy with most recent hospitalization from 12/31-1/7 returning from SNF with increased confusion, generalized weakness, fever to 100.3, decreased appetite and difficulty with ADLs, and admitted with working diagnosis of urinary tract infection.  It seems like she was started on Cipro before her presentation to ED.  In ED, stable vitals but soft BP.  AST 50.  Otherwise CMP without significant finding.  WBC 18.6.  Hgb 11.8 (about baseline.  Lactic acid negative.  UA with trace leukocytes.  COVID-19, influenza and RSV PCR nonreactive.  Blood cultures obtained.  Started on IV ceftriaxone  and admitted.    Subjective: Seen and examined earlier this morning.  No major events overnight or this morning.  Patient is awake but only oriented to self.  She is asking for her clothes.  No other complaints.  She denies pain   Assessment and plan: Possible UTI: Recurrent admission for UTI.  Per triage nurse, report of dysuria and fever.  She has a leukocytosis.  Recently treated for Enterococcus UTI.  UA convincing for UTI.  No suprapubic tenderness on exam.  Blood cultures NGTD.  Sepsis ruled out.  Does not meet criteria. - Continue IV ceftriaxone  pending culture.  Doubt recurrence of Enterococcus - Added urine culture to previous sample  Acute metabolic encephalopathy/dementia with behavioral disturbance: Patient is awake and alert but only oriented to self.  No focal neurodeficit. Per daughter, not formal diagnosis of dementia.  Oriented to self, family, place and time most of  time except when she has UTIs. This could be due to UTI, Cipro, dehydration and delirium. -Reorientation and delirium precaution -Continue home depakote  -Treat UTI as above.  Failure to thrive: Recurrent admission for UTI, encephalopathy, confusion and poor p.o. intake. Due to dementia? DNR/DNI.  Leukocytosis/bandemia: could be due to UTI.   Generalized weakness -PT and OT eval  Anemia: Hgb about baseline. -Monitor.  History of GERD - Continue PPI  Body mass index is 24.95 kg/m.        [name]   DVT prophylaxis:  enoxaparin  (LOVENOX ) injection 40 mg Start: 04/30/24 1000 SCDs Start: 04/30/24 0416  Code Status: DNR. Family Communication: Updated patient's daughter over the phone Level of care: Med-Surg Status is: Observation The patient will require care spanning > 2 midnights and should be moved to inpatient because: Possible UTI, dehydration and encephalopathy/delirium   Final disposition: Likely back to SNF   55 minutes with more than 50% spent in reviewing records, counseling patient/family and coordinating care.  Consultants:  None  Procedures: None  Microbiology summarized: COVID-19, influenza and RSV PCR nonreactive Blood cultures NGTD Urine culture pending  Objective: Vitals:   04/30/24 0145 04/30/24 0349 04/30/24 0400 04/30/24 1234  BP:  (!) 99/45  (!) 86/39  Pulse: 80 76  71  Resp:  18  19  Temp:  (!) 97.5 F (36.4 C)  (!) 97.5 F (36.4 C)  TempSrc:  Oral    SpO2: 98% 94%  97%  Weight:   68 kg   Height:   5' 5 (1.651 m)     Examination:  GENERAL:  No apparent distress.  Nontoxic. HEENT: MMM.  Vision and hearing grossly intact.  NECK: Supple.  No apparent JVD.  RESP:  No IWOB.  Fair aeration bilaterally. CVS:  RRR. Heart sounds normal.  ABD/GI/GU: BS+. Abd soft, NTND.  MSK/EXT:  Moves extremities. No apparent deformity. No edema.  SKIN: no apparent skin lesion or wound NEURO: AA.  Oriented to self.  No apparent focal neuro  deficit. PSYCH: Calm. Normal affect.   Sch Meds:  Scheduled Meds:  (feeding supplement) PROSource Plus  30 mL Oral QPM   artificial tears  1 drop Both Eyes Daily   aspirin  EC  81 mg Oral Daily   divalproex   125 mg Oral QPM   enoxaparin  (LOVENOX ) injection  40 mg Subcutaneous Q24H   pantoprazole   40 mg Oral Daily   sodium chloride  flush  3 mL Intravenous Q12H   Continuous Infusions:  sodium chloride  75 mL/hr at 04/30/24 0430   [START ON 05/01/2024] cefTRIAXone  (ROCEPHIN )  IV     PRN Meds:.acetaminophen  **OR** acetaminophen , bisacodyl , hydrALAZINE , ondansetron  **OR** ondansetron  (ZOFRAN ) IV, senna-docusate, traZODone   Antimicrobials: Anti-infectives (From admission, onward)    Start     Dose/Rate Route Frequency Ordered Stop   05/01/24 1000  cefTRIAXone  (ROCEPHIN ) 1 g in sodium chloride  0.9 % 100 mL IVPB        1 g 200 mL/hr over 30 Minutes Intravenous Every 24 hours 04/30/24 0151     04/30/24 0145  cefTRIAXone  (ROCEPHIN ) 1 g in sodium chloride  0.9 % 100 mL IVPB        1 g 200 mL/hr over 30 Minutes Intravenous  Once 04/30/24 0131 04/30/24 0226        I have personally reviewed the following labs and images: CBC: Recent Labs  Lab 04/29/24 2308 04/30/24 0431  WBC 18.6* 14.1*  NEUTROABS 17.2*  --   HGB 11.8* 11.9*  HCT 35.8* 34.6*  MCV 96.0 93.8  PLT 226 218   BMP &GFR Recent Labs  Lab 04/29/24 2308 04/30/24 0431  NA 138 138  K 3.8 3.6  CL 106 108  CO2 23 20*  GLUCOSE 80 75  BUN 29* 27*  CREATININE 0.86 0.84  CALCIUM 9.7 9.7  MG 1.9  --    Estimated Creatinine Clearance: 40.9 mL/min (by C-G formula based on SCr of 0.84 mg/dL). Liver & Pancreas: Recent Labs  Lab 04/29/24 2308 04/30/24 0431  AST 50* 43*  ALT 27 23  ALKPHOS 76 67  BILITOT 0.9 0.7  PROT 5.6* 5.5*  ALBUMIN 3.1* 3.0*   No results for input(s): LIPASE, AMYLASE in the last 168 hours. No results for input(s): AMMONIA in the last 168 hours. Diabetic: No results for input(s):  HGBA1C in the last 72 hours. No results for input(s): GLUCAP in the last 168 hours. Cardiac Enzymes: No results for input(s): CKTOTAL, CKMB, CKMBINDEX, TROPONINI in the last 168 hours. No results for input(s): PROBNP in the last 8760 hours. Coagulation Profile: No results for input(s): INR, PROTIME in the last 168 hours. Thyroid  Function Tests: No results for input(s): TSH, T4TOTAL, FREET4, T3FREE, THYROIDAB in the last 72 hours. Lipid Profile: No results for input(s): CHOL, HDL, LDLCALC, TRIG, CHOLHDL, LDLDIRECT in the last 72 hours. Anemia Panel: No results for input(s): VITAMINB12, FOLATE, FERRITIN, TIBC, IRON, RETICCTPCT in the last 72 hours. Urine analysis:    Component Value Date/Time   COLORURINE YELLOW 04/30/2024 0112   APPEARANCEUR CLEAR 04/30/2024 0112   LABSPEC 1.017 04/30/2024 0112   PHURINE 6.0 04/30/2024 0112  GLUCOSEU NEGATIVE 04/30/2024 0112   HGBUR NEGATIVE 04/30/2024 0112   BILIRUBINUR NEGATIVE 04/30/2024 0112   KETONESUR NEGATIVE 04/30/2024 0112   PROTEINUR NEGATIVE 04/30/2024 0112   UROBILINOGEN 0.2 07/11/2010 1400   NITRITE NEGATIVE 04/30/2024 0112   LEUKOCYTESUR TRACE (A) 04/30/2024 0112   Sepsis Labs: Invalid input(s): PROCALCITONIN, LACTICIDVEN  Microbiology: Recent Results (from the past 240 hours)  Blood culture (routine x 2)     Status: None (Preliminary result)   Collection Time: 04/29/24 11:08 PM   Specimen: BLOOD  Result Value Ref Range Status   Specimen Description   Final    BLOOD BLOOD RIGHT FOREARM Performed at [facility], [address] [place], [place] [address]    Special Requests   Final    BOTTLES DRAWN AEROBIC AND ANAEROBIC Blood Culture adequate volume Performed at [facility], [address], [place], [place] [address]    Culture   Final    NO GROWTH < 12 HOURS Performed at [facility], [address] [place], [place]  [address]    Report Status PENDING  Incomplete  Resp panel by RT-PCR (RSV, Flu A&B, Covid)     Status: None   Collection Time: 04/29/24 11:16 PM   Specimen: Nasal Swab  Result Value Ref Range Status   SARS Coronavirus 2 by RT PCR NEGATIVE NEGATIVE Final    Comment: (NOTE) SARS-CoV-2 target nucleic acids are NOT DETECTED.  The SARS-CoV-2 RNA is generally detectable in upper respiratory specimens during the acute phase of infection. The lowest concentration of SARS-CoV-2 viral copies this assay can detect is 138 copies/mL. A negative result does not preclude SARS-Cov-2 infection and should not be used as the sole basis for treatment or other patient management decisions. A negative result may occur with  improper specimen collection/handling, submission of specimen other than nasopharyngeal swab, presence of viral mutation(s) within the areas targeted by this assay, and inadequate number of viral copies(<138 copies/mL). A negative result must be combined with clinical observations, patient history, and epidemiological information. The expected result is Negative.  Fact Sheet for Patients:  bloggercourse.com  Fact Sheet for Healthcare Providers:  seriousbroker.it  This test is no t yet approved or cleared by the [place]  FDA and  has been authorized for detection and/or diagnosis of SARS-CoV-2 by FDA under an Emergency Use Authorization (EUA). This EUA will remain  in effect (meaning this test can be used) for the duration of the COVID-19 declaration under Section 564(b)(1) of the Act, 21 U.S.C.section 360bbb-3(b)(1), unless the authorization is terminated  or revoked sooner.       Influenza A by PCR NEGATIVE NEGATIVE Final   Influenza B by PCR NEGATIVE NEGATIVE Final    Comment: (NOTE) The Xpert Xpress SARS-CoV-2/FLU/RSV plus assay is intended as an aid in the diagnosis of influenza from Nasopharyngeal swab specimens  and should not be used as a sole basis for treatment. Nasal washings and aspirates are unacceptable for Xpert Xpress SARS-CoV-2/FLU/RSV testing.  Fact Sheet for Patients: bloggercourse.com  Fact Sheet for Healthcare Providers: seriousbroker.it  This test is not yet approved or cleared by the [place]  FDA and has been authorized for detection and/or diagnosis of SARS-CoV-2 by FDA under an Emergency Use Authorization (EUA). This EUA will remain in effect (meaning this test can be used) for the duration of the COVID-19 declaration under Section 564(b)(1) of the Act, 21 U.S.C. section 360bbb-3(b)(1), unless the authorization is terminated or revoked.     Resp  Syncytial Virus by PCR NEGATIVE NEGATIVE Final    Comment: (NOTE) Fact Sheet for Patients: bloggercourse.com  Fact Sheet for Healthcare Providers: seriousbroker.it  This test is not yet approved or cleared by the [place]  FDA and has been authorized for detection and/or diagnosis of SARS-CoV-2 by FDA under an Emergency Use Authorization (EUA). This EUA will remain in effect (meaning this test can be used) for the duration of the COVID-19 declaration under Section 564(b)(1) of the Act, 21 U.S.C. section 360bbb-3(b)(1), unless the authorization is terminated or revoked.  Performed at [facility], [address] [place], [place] [address]   Blood culture (routine x 2)     Status: None (Preliminary result)   Collection Time: 04/30/24  4:31 AM   Specimen: BLOOD RIGHT HAND  Result Value Ref Range Status   Specimen Description   Final    BLOOD RIGHT HAND Performed at [facility], [address] [place], [place] [address]    Special Requests   Final    BOTTLES DRAWN AEROBIC ONLY Blood Culture results may not be optimal due to an inadequate volume of blood received in culture  bottles Performed at [facility], [address], [place], [place] [address]    Culture PENDING  Incomplete   Report Status PENDING  Incomplete    Radiology Studies: DG Chest Port 1 View Result Date: 04/29/2024 EXAM: 1 VIEW XRAY OF THE CHEST 04/29/2024 10:52:00 PM COMPARISON: 04/09/2024 CLINICAL HISTORY: Sepsis, gen weakness. FINDINGS: LUNGS AND PLEURA: No focal pulmonary opacity. No pleural effusion. No pneumothorax. HEART AND MEDIASTINUM: No acute abnormality of the cardiac and mediastinal silhouettes. Aortic atherosclerosis. BONES AND SOFT TISSUES: No acute osseous abnormality. IMPRESSION: 1. No acute process. Electronically signed by: Franky Crease MD 04/29/2024 10:55 PM EST RP Workstation: [id number]      Ujbz T. Preesha Benjamin [facility]  If 7PM-7AM, please contact night-coverage [email or web address] 04/30/2024, 2:42 PM   "

## 2024-04-30 NOTE — H&P (Signed)
 History and Physical   [facility] Admission History and Physical [employer], D.O.    Patient Name: Gina [name] MR#: [id number] Date of Birth: 1932/08/27 Date of Admission: 04/29/2024  Referring MD/NP/PA: PA Olam Slocumb Primary Care Physician: Teresa Thersia Jansky, MD  Chief Complaint:  Chief Complaint  Patient presents with   Dysuria   Altered Mental Status   Please note the entire history is obtained from the patient's emergency department chart, emergency department provider and the patient's family.. Patient's personal history is limited by dementia and altered mental status.  HPI: Gina [name] is a 89 y.o. female with a known history of early dementia hypertension, CKD 3, GERD, paroxysmal SVT, frequent urinary tract infections presents to the emergency department for evaluation of altered mental status.  Patient's family brought her to the emergency department from her living facility with confusion and generalized weakness, difficulty with her ADLs fever to 100.3, decreased appetite.  Symptoms are similar to prior urinary tract infections.  Looks like she was started on antibiotics 2 days ago she has been hospitalized several times with UTIs.    Patient is confused and provides limited history.  She complains only of buttock pain..  Otherwise there has been no change in status. Patient has been taking medication as prescribed and there has been no recent change in medication or diet.  No recent antibiotics.  There has been no recent illness, hospitalizations, travel or sick contacts.    EMS/ED Course: Patient was found to have white count of 18,000, UA with trace leukocytes and white blood cells.  In the emergency department she received Rocephin . Medical admission has been requested for further management of altered mental status secondary to urinary tract infection.  Review of Systems:  Unable to obtain secondary to altered mental  status   Past Medical History:  Diagnosis Date   CKD (chronic kidney disease) stage 3, GFR 30-59 ml/min (HCC)    GERD (gastroesophageal reflux disease)    Hypertension    Paroxysmal SVT (supraventricular tachycardia) 02/15/2024   Pneumonia     Past Surgical History:  Procedure Laterality Date   FOOT SURGERY     Partial removal right great toe   Right knee surgery     SHOULDER SURGERY Right    TOTAL KNEE ARTHROPLASTY Left 12/31/2023   Procedure: ARTHROPLASTY, KNEE, TOTAL;  Surgeon: Melodi Lerner, MD;  Location: [facility];  Service: Orthopedics;  Laterality: Left;     reports that she has never smoked. She has never used smokeless tobacco. She reports that she does not drink alcohol  and does not use drugs.  Allergies[1]  Family History  Problem Relation Age of Onset   Heart disease Mother    Cataracts Mother    Lung cancer Father     Prior to Admission medications  Medication Sig Start Date End Date Taking? Authorizing Provider  acetaminophen  (TYLENOL ) 500 MG tablet Take 1,000 mg by mouth daily as needed for moderate pain (pain score 4-6) or mild pain (pain score 1-3).    [provider]  Amino Acids-Protein Hydrolys (PRO-STAT PO) Take 30 mLs by mouth every evening.    [provider]  aspirin  EC 81 MG tablet Take 81 mg by mouth daily.    [provider]  carboxymethylcellulose (REFRESH PLUS) 0.5 % SOLN Place 1 drop into both eyes daily.    [provider]  diclofenac  Sodium (VOLTAREN ) 1 % GEL Apply 2 g topically 4 (four) times daily.  04/13/24   Raenelle Coria, MD  divalproex  (DEPAKOTE ) 125 MG DR tablet Take 125 mg by mouth every evening.    [provider]  docusate sodium  (COLACE) 100 MG capsule Take 100 mg by mouth every evening.    [provider]  omeprazole (PRILOSEC) 20 MG capsule Take 20 mg by mouth daily. 04/05/20   [provider]    Physical Exam: Vitals:   04/29/24 2215 04/30/24 0115 04/30/24 0145  BP:  98/68 109/78   Pulse: 80 75 80  Resp: 15    Temp: 98 F (36.7 C)    TempSrc: Oral    SpO2: 99% 98% 98%    GENERAL: 89 y.o.-year-old white female patient, well-developed, well-nourished lying in the bed in no acute distress.  Pleasantly confused HEENT: Very hard of hearing head atraumatic, normocephalic. Pupils equal. Mucus membranes dry NECK: Supple. No JVD. CHEST: Normal breath sounds bilaterally. No wheezing, rales, rhonchi or crackles. No use of accessory muscles of respiration.  No reproducible chest wall tenderness.  CARDIOVASCULAR: S1, S2 normal. No murmurs, rubs, or gallops. Cap refill <2 seconds. Pulses intact distally.  ABDOMEN: Soft, nondistended, nontender. No rebound, guarding, rigidity. Normoactive bowel sounds present in all four quadrants.  EXTREMITIES: No pedal edema, cyanosis, or clubbing. No calf tenderness or Homan's sign.  NEUROLOGIC: The patient is alert and oriented x 1.  Complies with exam    Labs on Admission:  CBC: Recent Labs  Lab 04/29/24 2308  WBC 18.6*  NEUTROABS 17.2*  HGB 11.8*  HCT 35.8*  MCV 96.0  PLT 226   Basic Metabolic Panel: Recent Labs  Lab 04/29/24 2308  NA 138  K 3.8  CL 106  CO2 23  GLUCOSE 80  BUN 29*  CREATININE 0.86  CALCIUM 9.7  MG 1.9   GFR: CrCl cannot be calculated (Unknown ideal weight.). Liver Function Tests: Recent Labs  Lab 04/29/24 2308  AST 50*  ALT 27  ALKPHOS 76  BILITOT 0.9  PROT 5.6*  ALBUMIN 3.1*   No results for input(s): LIPASE, AMYLASE in the last 168 hours. No results for input(s): AMMONIA in the last 168 hours. Coagulation Profile: No results for input(s): INR, PROTIME in the last 168 hours. Cardiac Enzymes: No results for input(s): CKTOTAL, CKMB, CKMBINDEX, TROPONINI in the last 168 hours. BNP (last 3 results) No results for input(s): PROBNP in the last 8760 hours. HbA1C: No results for input(s): HGBA1C in the last 72 hours. CBG: No results for input(s):  GLUCAP in the last 168 hours. Lipid Profile: No results for input(s): CHOL, HDL, LDLCALC, TRIG, CHOLHDL, LDLDIRECT in the last 72 hours. Thyroid  Function Tests: No results for input(s): TSH, T4TOTAL, FREET4, T3FREE, THYROIDAB in the last 72 hours. Anemia Panel: No results for input(s): VITAMINB12, FOLATE, FERRITIN, TIBC, IRON, RETICCTPCT in the last 72 hours. Urine analysis:    Component Value Date/Time   COLORURINE YELLOW 04/30/2024 0112   APPEARANCEUR CLEAR 04/30/2024 0112   LABSPEC 1.017 04/30/2024 0112   PHURINE 6.0 04/30/2024 0112   GLUCOSEU NEGATIVE 04/30/2024 0112   HGBUR NEGATIVE 04/30/2024 0112   BILIRUBINUR NEGATIVE 04/30/2024 0112   KETONESUR NEGATIVE 04/30/2024 0112   PROTEINUR NEGATIVE 04/30/2024 0112   UROBILINOGEN 0.2 07/11/2010 1400   NITRITE NEGATIVE 04/30/2024 0112   LEUKOCYTESUR TRACE (A) 04/30/2024 0112   Sepsis Labs: @LABRCNTIP (procalcitonin:4,lacticidven:4) ) Recent Results (from the past 240 hours)  Resp panel by RT-PCR (RSV, Flu A&B, Covid)     Status: None   Collection Time: 04/29/24 11:16 PM  Specimen: Nasal Swab  Result Value Ref Range Status   SARS Coronavirus 2 by RT PCR NEGATIVE NEGATIVE Final    Comment: (NOTE) SARS-CoV-2 target nucleic acids are NOT DETECTED.  The SARS-CoV-2 RNA is generally detectable in upper respiratory specimens during the acute phase of infection. The lowest concentration of SARS-CoV-2 viral copies this assay can detect is 138 copies/mL. A negative result does not preclude SARS-Cov-2 infection and should not be used as the sole basis for treatment or other patient management decisions. A negative result may occur with  improper specimen collection/handling, submission of specimen other than nasopharyngeal swab, presence of viral mutation(s) within the areas targeted by this assay, and inadequate number of viral copies(<138 copies/mL). A negative result must be combined with clinical  observations, patient history, and epidemiological information. The expected result is Negative.  Fact Sheet for Patients:  bloggercourse.com  Fact Sheet for Healthcare Providers:  seriousbroker.it  This test is no t yet approved or cleared by the [place]  FDA and  has been authorized for detection and/or diagnosis of SARS-CoV-2 by FDA under an Emergency Use Authorization (EUA). This EUA will remain  in effect (meaning this test can be used) for the duration of the COVID-19 declaration under Section 564(b)(1) of the Act, 21 U.S.C.section 360bbb-3(b)(1), unless the authorization is terminated  or revoked sooner.       Influenza A by PCR NEGATIVE NEGATIVE Final   Influenza B by PCR NEGATIVE NEGATIVE Final    Comment: (NOTE) The Xpert Xpress SARS-CoV-2/FLU/RSV plus assay is intended as an aid in the diagnosis of influenza from Nasopharyngeal swab specimens and should not be used as a sole basis for treatment. Nasal washings and aspirates are unacceptable for Xpert Xpress SARS-CoV-2/FLU/RSV testing.  Fact Sheet for Patients: bloggercourse.com  Fact Sheet for Healthcare Providers: seriousbroker.it  This test is not yet approved or cleared by the [place]  FDA and has been authorized for detection and/or diagnosis of SARS-CoV-2 by FDA under an Emergency Use Authorization (EUA). This EUA will remain in effect (meaning this test can be used) for the duration of the COVID-19 declaration under Section 564(b)(1) of the Act, 21 U.S.C. section 360bbb-3(b)(1), unless the authorization is terminated or revoked.     Resp Syncytial Virus by PCR NEGATIVE NEGATIVE Final    Comment: (NOTE) Fact Sheet for Patients: bloggercourse.com  Fact Sheet for Healthcare Providers: seriousbroker.it  This test is not yet approved or cleared by  the [place]  FDA and has been authorized for detection and/or diagnosis of SARS-CoV-2 by FDA under an Emergency Use Authorization (EUA). This EUA will remain in effect (meaning this test can be used) for the duration of the COVID-19 declaration under Section 564(b)(1) of the Act, 21 U.S.C. section 360bbb-3(b)(1), unless the authorization is terminated or revoked.  Performed at [facility], [address], [place], [place] [address]      Radiological Exams on Admission: DG Chest Port 1 View Result Date: 04/29/2024 EXAM: 1 VIEW XRAY OF THE CHEST 04/29/2024 10:52:00 PM COMPARISON: 04/09/2024 CLINICAL HISTORY: Sepsis, gen weakness. FINDINGS: LUNGS AND PLEURA: No focal pulmonary opacity. No pleural effusion. No pneumothorax. HEART AND MEDIASTINUM: No acute abnormality of the cardiac and mediastinal silhouettes. Aortic atherosclerosis. BONES AND SOFT TISSUES: No acute osseous abnormality. IMPRESSION: 1. No acute process. Electronically signed by: Franky Crease MD 04/29/2024 10:55 PM EST RP Workstation: [id number]    EKG: Normal sinus rhythm at 80 bpm with leftward axis, T WI in 3 and nonspecific ST-T wave changes.  Assessment/Plan  This is a 89 y.o. female with a history of early dementia, hypertension, CKD 3, GERD, paroxysmal SVT, frequent urinary tract infections now being admitted with:  #.  Sepsis, altered mental status/metabolic encephalopathy secondary to urinary tract infection Patient had recent hospitalization for urinary tract infectious with grew Enterococcus for which she completed 7 days of antibiotic therapy.  She was discharged to SNF. Patient is meeting sepsis criteria with a temperature of 97.5, blood pressure 99/45, white blood cell count of 18,000, positive urinalysis - Admit to inpatient with telemetry monitoring - IV antibiotics: Continue Rocephin  empirically.  Previous cultures show sensitivity to Rocephin  - IV fluid hydration - Follow up  blood,urine cultures - Repeat CBC in am.  - Consider infectious disease consult  #.  History of GERD - Continue PPI   Admission status: Inpatient IV Fluids: Normal saline Diet/Nutrition: Heart healthy Consults called: None DVT Px: Lovenox , SCDs and early ambulation. Code Status: DNR/DNI Disposition Plan: To home in 1-2 days  All the records are reviewed and case discussed with ED provider.   Blas Riches D.O. on 04/30/2024 at 1:48 AM CC: Primary care physician; Teresa Thersia Jansky, MD   04/30/2024, 1:48 AM        [1]  Allergies Allergen Reactions   Codeine Other (See Comments)    Insomnia    Strawberry Extract Hives

## 2024-04-30 NOTE — TOC Initial Note (Signed)
 Transition of Care [facility]) - Initial/Assessment Note    Patient Details  Name: Gina [name] MRN: [id number] Date of Birth: 09-23-1932  Transition of Care [facility]) CM/SW Contact:    [name], RN Phone Number: 04/30/2024, 10:53 AM  Clinical Narrative:                 MOON complete. PTA from [facility] for STR. CM called [name] No (dlt) [phone] and agreeable to returning to [facility] for rehab. PTAR for transport at discharge. IP CM following  Expected Discharge Plan: Skilled Nursing Facility Barriers to Discharge: Continued Medical Work up   Patient Goals and CMS Choice Patient states their goals for this hospitalization and ongoing recovery are:: [name] Compare Post Acute Care list provided to:: Patient Choice offered to / list presented to : Patient [place] ownership interest in [facility].provided to:: Patient    Expected Discharge Plan and Services In-house Referral: NA Discharge Planning Services: CM Consult   Living arrangements for the past 2 months: Single Family Home                 DME Arranged: N/A DME Agency: NA       HH Arranged: NA HH Agency: NA        Prior Living Arrangements/Services Living arrangements for the past 2 months: Single Family Home Lives with:: Adult Children Patient language and need for interpreter reviewed:: Yes Do you feel safe going back to the place where you live?: Yes      Need for Family Participation in Patient Care: Yes (Comment) Care giver support system in place?: Yes (comment)   Criminal Activity/Legal Involvement Pertinent to Current Situation/Hospitalization: No - Comment as needed  Activities of Daily Living   ADL Screening (condition at time of admission) Independently performs ADLs?: No Does the patient have a NEW difficulty with bathing/dressing/toileting/self-feeding that is expected to last >3 days?: No Does the patient have a NEW difficulty with getting  in/out of bed, walking, or climbing stairs that is expected to last >3 days?: Yes (Initiates electronic notice to provider for possible PT consult) Does the patient have a NEW difficulty with communication that is expected to last >3 days?: No Is the patient deaf or have difficulty hearing?: Yes Does the patient have difficulty seeing, even when wearing glasses/contacts?: No  Permission Sought/Granted Permission sought to share information with : Case Manager Permission granted to share information with : Yes, Verbal Permission Granted  Share Information with NAME: [name],[name]  Daughter, Emergency Contact  [phone]  Permission granted to share info w AGENCY: [facility]        Emotional Assessment Appearance:: Appears stated age Attitude/Demeanor/Rapport: Engaged Affect (typically observed): Appropriate Orientation: : Oriented to Self, Oriented to Place, Oriented to  Time, Oriented to Situation Alcohol  / Substance Use: Not Applicable Psych Involvement: No (comment)  Admission diagnosis:  Lower urinary tract infectious disease [N39.0] UTI (urinary tract infection) [N39.0] Generalized weakness [R53.1] Patient Active Problem List   Diagnosis Date Noted   Hypertension 02/27/2024   Gastroesophageal reflux disease 02/27/2024   UTI (urinary tract infection) 02/23/2024   Left renal mass 02/15/2024   DNR (do not resuscitate) 02/15/2024   OA (osteoarthritis) of knee 12/31/2023   Primary osteoarthritis of left knee 12/31/2023   CKD stage 3a, GFR 45-59 ml/min (HCC) - baseline Scr 1-1.2 11/21/2021   Left knee pain 11/21/2021   Acute metabolic encephalopathy 11/21/2021   SIRS (systemic inflammatory response syndrome) (HCC) 06/12/2020   Fall  at home, initial encounter 06/12/2020   Hypotension 06/12/2020   Hypokalemia 06/12/2020   Weakness 06/12/2020   PCP:  Teresa Thersia Jansky, MD Pharmacy:   [facility] - [place], [place] - [address] [place]  [address] Phone: [phone] Fax: [phone]  [id number] [place], [place] - [address] AT [facility] OF [address] [place] [address] Phone: [phone] Fax: [phone]  [facility] [address] [place] [address] Phone: [phone] Fax: [phone]  [facility] - [facility] [address] [place] [address] Phone: [phone] Fax: [phone]     Social Drivers of Health (SDOH) Social History: SDOH Screenings   Food Insecurity: No Food Insecurity (04/30/2024)  Housing: Low Risk (04/30/2024)  Transportation Needs: No Transportation Needs (04/30/2024)  Utilities: Not At Risk (04/30/2024)  Social Connections: Moderately Integrated (04/30/2024)  Tobacco Use: Low Risk (04/29/2024)   SDOH Interventions:     Readmission Risk Interventions     No data to display

## 2024-05-01 DIAGNOSIS — F03918 Unspecified dementia, unspecified severity, with other behavioral disturbance: Secondary | ICD-10-CM

## 2024-05-01 DIAGNOSIS — N3000 Acute cystitis without hematuria: Secondary | ICD-10-CM | POA: Diagnosis not present

## 2024-05-01 LAB — CBC
HCT: 34.2 % — ABNORMAL LOW (ref 36.0–46.0)
Hemoglobin: 11.3 g/dL — ABNORMAL LOW (ref 12.0–15.0)
MCH: 31.7 pg (ref 26.0–34.0)
MCHC: 33 g/dL (ref 30.0–36.0)
MCV: 96.1 fL (ref 80.0–100.0)
Platelets: 194 K/uL (ref 150–400)
RBC: 3.56 MIL/uL — ABNORMAL LOW (ref 3.87–5.11)
RDW: 12.6 % (ref 11.5–15.5)
WBC: 5.7 K/uL (ref 4.0–10.5)
nRBC: 0 % (ref 0.0–0.2)

## 2024-05-01 LAB — COMPREHENSIVE METABOLIC PANEL WITH GFR
ALT: 16 U/L (ref 0–44)
AST: 26 U/L (ref 15–41)
Albumin: 2.9 g/dL — ABNORMAL LOW (ref 3.5–5.0)
Alkaline Phosphatase: 68 U/L (ref 38–126)
Anion gap: 8 (ref 5–15)
BUN: 21 mg/dL (ref 8–23)
CO2: 21 mmol/L — ABNORMAL LOW (ref 22–32)
Calcium: 9.3 mg/dL (ref 8.9–10.3)
Chloride: 112 mmol/L — ABNORMAL HIGH (ref 98–111)
Creatinine, Ser: 0.88 mg/dL (ref 0.44–1.00)
GFR, Estimated: >60 mL/min
Glucose, Bld: 78 mg/dL (ref 70–99)
Potassium: 3.8 mmol/L (ref 3.5–5.1)
Sodium: 140 mmol/L (ref 135–145)
Total Bilirubin: 0.4 mg/dL (ref 0.0–1.2)
Total Protein: 5.5 g/dL — ABNORMAL LOW (ref 6.5–8.1)

## 2024-05-01 LAB — AMMONIA: Ammonia: 22 umol/L (ref 9–35)

## 2024-05-01 LAB — TSH: TSH: 1.97 u[IU]/mL (ref 0.350–4.500)

## 2024-05-01 LAB — VITAMIN B12: Vitamin B-12: 351 pg/mL (ref 180–914)

## 2024-05-01 LAB — URINE CULTURE: Culture: NO GROWTH

## 2024-05-01 LAB — CK: Total CK: 21 U/L — ABNORMAL LOW (ref 38–234)

## 2024-05-01 LAB — SYPHILIS: RPR W/REFLEX TO RPR TITER AND TREPONEMAL ANTIBODIES, TRADITIONAL SCREENING AND DIAGNOSIS ALGORITHM
RPR Ser Ql: REACTIVE — AB
RPR Titer: 1:1 {titer}

## 2024-05-01 LAB — HIV ANTIBODY (ROUTINE TESTING W REFLEX): HIV Screen 4th Generation wRfx: NONREACTIVE

## 2024-05-01 LAB — MAGNESIUM: Magnesium: 1.9 mg/dL (ref 1.7–2.4)

## 2024-05-01 NOTE — TOC Transition Note (Signed)
 Transition of Care [facility]) - Discharge Note   Patient Details  Name: Gina [name] MRN: [id number] Date of Birth: 25-Jan-1933  Transition of Care [facility]) CM/SW Contact:  [name], RN Phone Number: 05/01/2024, 12:42 PM   Clinical Narrative:    Per MD patient ready for DC to [facility]. RN to call report prior to discharge [phone] Rm [phone]). RN, patient, patient's family, and facility notified of DC. Discharge Summary and FL2 sent to facility via HUB. DC packet on chart includes face sheet, medical necessity, DNR. Ambulance PTAR transport requested for patient.   CM signing off.  Please consult us  again if new needs arise.     Final next level of care: Skilled Nursing Facility Barriers to Discharge: Barriers Resolved   Patient Goals and CMS Choice Patient states their goals for this hospitalization and ongoing recovery are:: [facility] CMS Medicare.gov Compare Post Acute Care list provided to:: Patient Choice offered to / list presented to : Patient [place] ownership interest in [facility].provided to:: Patient    Discharge Placement   Existing PASRR number confirmed : 05/01/24          Patient chooses bed at: [facility] Patient to be transferred to facility by: PTAR Name of family member notified: [name] Patient and family notified of of transfer: 05/01/24  Discharge Plan and Services Additional resources added to the After Visit Summary for   In-house Referral: NA Discharge Planning Services: CM Consult            DME Arranged: N/A DME Agency: NA       HH Arranged: NA HH Agency: NA        Social Drivers of Health (SDOH) Interventions SDOH Screenings   Food Insecurity: No Food Insecurity (04/30/2024)  Housing: Low Risk (04/30/2024)  Transportation Needs: No Transportation Needs (04/30/2024)  Utilities: Not At Risk (04/30/2024)  Social Connections: Moderately Integrated (04/30/2024)  Tobacco Use: Low Risk (04/29/2024)     Readmission Risk  Interventions     No data to display

## 2024-05-01 NOTE — Evaluation (Signed)
 Occupational Therapy Evaluation Patient Details Name: Gina [name] MRN: [id number] DOB: February 25, 1933 Today's Date: 05/01/2024   History of Present Illness   Gina [name] is a 89 y.o. female presents to therapy following hospital admission on 04/29/2024 due to AMS, generalized weakness increased difficulty with ADLs, fever and poor PO intake. Pt was admitted earlier this month due to UTI, metabolic encephalopathy and AMS. Pt was found to have UTI. 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     Clinical Impressions Pt presents with decline in function and safety with ADLs and ADL mobility with impaired strength, balance, endurance and cognition. PTA pt lived with family and prior to her L knee replacement in September, the pt required assist for spongebathing and occasional assist for toileting hygiene, could dress herself. Her daughter completed the laundry & household cleaning, was Mod I with RW until knee sx in 12/31/23; then went [facility] re[facility] afer TKA. Pt currently requires Sup with increased time to sit EOB, CGA with UB ADLs and grooming, max-total A with LB ADLs, max A with toileting tasks and min A/CGA with STS and functional mobility with RW with cues for hand placement and safety. OT will follow acutely to maximize level of function and safety     If plan is discharge home, recommend the following:   A little help with walking and/or transfers;A lot of help with bathing/dressing/bathroom;Direct supervision/assist for financial management;Assist for transportation;Help with stairs or ramp for entrance;Supervision due to cognitive status     Functional Status Assessment   Patient has had a recent decline in their functional status and demonstrates the ability to make significant improvements in function in a  reasonable and predictable amount of time.     Equipment Recommendations   Other (comment) defer to SNF     Recommendations for Other Services         Precautions/Restrictions   Precautions Precautions: Fall Recall of Precautions/Restrictions: Impaired Restrictions Weight Bearing Restrictions Per Provider Order: No     Mobility Bed Mobility Overal bed mobility: Needs Assistance Bed Mobility: Supine to Sit, Sit to Supine     Supine to sit: Supervision     General bed mobility comments: increased time, min A with LEs back onto bed    Transfers Overall transfer level: Needs assistance Equipment used: Rolling walker (2 wheels) Transfers: Sit to/from Stand Sit to Stand: Min assist Stand pivot transfers: Contact guard assist   Step pivot transfers: Contact guard assist     General transfer comment: cues for hand placement and safety      Balance Overall balance assessment: Needs assistance, History of Falls Sitting-balance support: Feet supported Sitting balance-Leahy Scale: Good     Standing balance support: Bilateral upper extremity supported, During functional activity, Reliant on assistive device for balance Standing balance-Leahy Scale: Fair                             ADL either performed or assessed with clinical judgement   ADL Overall ADL's : Needs assistance/impaired Eating/Feeding: Set up;Supervision/ safety;Sitting   Grooming: Wash/dry hands;Wash/dry face;Contact guard assist;Sitting   Upper Body Bathing: Minimal assistance   Lower Body Bathing: Maximal assistance   Upper Body Dressing : Minimal assistance   Lower Body Dressing: Total assistance  Toilet Transfer: Contact guard assist;Stand-pivot;Cueing for safety;BSC/3in1   Toileting- Clothing Manipulation and Hygiene: Maximal assistance       Functional mobility during ADLs: Minimal assistance;Contact guard assist;Rolling walker (2 wheels);Cueing for safety        Vision Baseline Vision/History: 1 Wears glasses       Perception         Praxis         Pertinent Vitals/Pain Pain Assessment Pain Assessment: Faces Pain Score: 0-No pain Faces Pain Scale: No hurt Pain Intervention(s): Monitored during session     Extremity/Trunk Assessment Upper Extremity Assessment Upper Extremity Assessment: Generalized weakness   Lower Extremity Assessment Lower Extremity Assessment: Defer to PT evaluation   Cervical / Trunk Assessment Cervical / Trunk Assessment: Kyphotic   Communication Communication Communication: Impaired Factors Affecting Communication: Hearing impaired   Cognition Arousal: Alert Behavior During Therapy: Agitated, Impulsive                                 Following commands: Impaired Following commands impaired: Follows one step commands inconsistently, Follows one step commands with increased time     Cueing  General Comments   Cueing Techniques: Verbal cues      Exercises     Shoulder Instructions      Home Living Family/patient expects to be discharged to:: Skilled nursing facility Living Arrangements: Children   Type of Home: House Home Access: Stairs to enter Entrance Stairs-Number of Steps: 5 Entrance Stairs-Rails: Left Home Layout: Two level;Able to live on main level with bedroom/bathroom Alternate Level Stairs-Number of Steps: main and upper level to home; she resides on the main level of the home   Bathroom Shower/Tub: Chief Strategy Officer: Standard     Home Equipment: Agricultural Consultant (2 wheels);Cane - single point;BSC/3in1;Tub bench   Additional Comments: pt is unable to provide info today; info regarding PLOF taken from chart review. prior to TKA pt was living with daughter in a 2 story home with 5 steps to enter and L handrail with B and B on first floor, pt was mod I with use of RW for household navigation and required some assist from daughter for  spongebathing and toileting as well as household management and IADLs      Prior Functioning/Environment Prior Level of Function : Needs assist;History of Falls (last six months)             Mobility Comments: Mod I with RW until knee sx in 12/31/23; [facility] afer TKA ADLs Comments: Per chart review: Prior to her L knee replacement in September, the pt required assist for spongebathing and occasional assist for toileting hygiene. She could dress herself, though with some difficulty. Her daughter completed the laundry & household cleaning.    OT Problem List: Impaired balance (sitting and/or standing);Decreased activity tolerance;Decreased safety awareness;Decreased strength   OT Treatment/Interventions: Self-care/ADL training;Therapeutic exercise;Therapeutic activities;Patient/family education;DME and/or AE instruction;Balance training      OT Goals(Current goals can be found in the care plan section)   Acute Rehab OT Goals Patient Stated Goal: get some rest OT Goal Formulation: With patient Time For Goal Achievement: 05/15/24 Potential to Achieve Goals: Good ADL Goals Pt Will Perform Grooming: with contact guard assist;with supervision;standing Pt Will Perform Upper Body Bathing: with supervision;with set-up;sitting Pt Will Perform Lower Body Bathing: with mod assist Pt Will Perform Upper Body Dressing: with supervision;with set-up;sitting Pt Will Transfer to Toilet:  with contact guard assist;with supervision;ambulating;grab bars   OT Frequency:  Min 2X/week    Co-evaluation              AM-PAC OT 6 Clicks Daily Activity     Outcome Measure Help from another person eating meals?: A Little Help from another person taking care of personal grooming?: A Little Help from another person toileting, which includes using toliet, bedpan, or urinal?: A Lot Help from another person bathing (including washing, rinsing, drying)?: A Lot Help from another person to put  on and taking off regular upper body clothing?: A Little Help from another person to put on and taking off regular lower body clothing?: Total 6 Click Score: 14   End of Session Equipment Utilized During Treatment: Gait belt;Rolling walker (2 wheels);Other (comment) (BSC) Nurse Communication: Mobility status  Activity Tolerance: Patient limited by fatigue;Treatment limited secondary to agitation Patient left: with call bell/phone within reach;in bed;with bed alarm set  OT Visit Diagnosis: Muscle weakness (generalized) (M62.81);History of falling (Z91.81)                Time: 8696-8679 OT Time Calculation (min): 17 min Charges:  OT General Charges $OT Visit: 1 Visit OT Evaluation $OT Eval Moderate Complexity: 1 Mod    Jacques Karna Loose 05/01/2024, 3:00 PM

## 2024-05-01 NOTE — Discharge Summary (Signed)
 "  Physician Discharge Summary  Gina [name] FMW:[id number] DOB: [birth year] DOA: 04/29/2024  PCP: Teresa Thersia Jansky, MD  Admit date: 04/29/2024 Discharge date: 05/01/24  Admitted From: SNF Disposition: SNF Recommendations for Outpatient Follow-up:   Ensure good hydration. Reorientation and delirium precaution. Check CMP and CBC in 1 week Please follow up on the following pending results: None   Discharge Condition: Stable CODE STATUS: DNR    Hospital course 89 year old F with PMH of severe dementia, paroxysmal SVT, GERD, osteoarthritis and recurrent hospitalization for UTI and encephalopathy with most recent hospitalization from 12/31-1/7 returning from SNF with increased confusion, generalized weakness, fever to 100.3, decreased appetite and difficulty with ADLs, and admitted with working diagnosis of urinary tract infection.  It seems like she was started on Cipro before her presentation to ED.   In ED, stable vitals but soft BP.  AST 50.  Otherwise CMP without significant finding.  WBC 18.6.  Hgb 11.8 (about baseline.  Lactic acid negative.  UA with trace leukocytes.  COVID-19, influenza and RSV PCR nonreactive.  Blood cultures obtained.  Started on IV ceftriaxone , IV fluid and admitted.  Blood culture and urine culture NGTD.  Leukocytosis resolved.  Encephalopathy labs including VBG, B12, TSH, ammonia and RPR unrevealing.  Received IV ceftriaxone  for 2 days.  On the day of discharge, patient felt well.  Awake and alert and oriented to self, city, state, month and year but not date, which I believe could be her baseline.  See individual problem list below for more.   Problems addressed during this hospitalization Possible UTI: Patient is not a reliable historian.  She has a leukocytosis and subjective fever on admission.  UA with trace leukocyte esterase.  Urine culture NGTD.  Note that patient had a dose of Cipro before admission.  Received 2 doses of IV ceftriaxone , which  should be sufficient.    Acute metabolic encephalopathy/dementia with behavioral disturbance: Per daughter, not formal diagnosis of dementia.  Oriented to self, family, place and time most of time.  Her encephalopathy/confusion could be due to delirium in the setting of dehydration, Cipro and possible UTI.  Per daughter, she tends to improve quickly while she get IV fluid in ED. -Reorientation and delirium precaution -Continue home depakote  -Ensure good hydration. -Avoid quinolones and sedating medications.   Failure to thrive: Recurrent admission for UTI, encephalopathy, confusion and poor p.o. intake. Due to dementia? DNR/DNI. -Consider palliative follow-up at SNF.   Leukocytosis/bandemia: could be due to UTI.  Resolved.   Generalized weakness - PT/OT recommending return to SNF.   Anemia: Hgb about baseline. - Recheck CBC in 1 week.   History of GERD -Continue PPI  Body mass index is 24.95 kg/m.        Consultations: None  Time spent 35  minutes  Vital signs Vitals:   04/30/24 1602 04/30/24 1947 05/01/24 0035 05/01/24 0444  BP: 126/66 (!) 100/48 (!) 123/50 (!) 130/57  Pulse: 72 73 79 70  Temp: 98.4 F (36.9 C) 97.9 F (36.6 C)  97.7 F (36.5 C)  Resp:   18   Height:      Weight:      SpO2: 99% 98% 97% 96%  TempSrc:  Oral  Oral  BMI (Calculated):         Discharge exam  GENERAL: No apparent distress.  Nontoxic. HEENT: MMM.  Vision grossly intact.  Hard of hearing. NECK: Supple.  No apparent JVD.  RESP:  No IWOB.  Fair aeration bilaterally. CVS:  RRR. Heart sounds normal.  ABD/GI/GU: BS+. Abd soft, NTND.  MSK/EXT:  Moves extremities. No apparent deformity. No edema.  SKIN: no apparent skin lesion or wound NEURO: Awake and alert. Oriented to self, city, state, month and year.  Follows commands.  No apparent focal neuro deficit. PSYCH: Calm. Normal affect.   Discharge Instructions Discharge Instructions     Increase activity slowly   Complete by: As  directed    No wound care   Complete by: As directed       Allergies as of 05/01/2024       Reactions   Codeine Other (See Comments)   Insomnia   Strawberry Extract Hives        Medication List     STOP taking these medications    ciprofloxacin 500 MG tablet Commonly known as: CIPRO   nitrofurantoin (macrocrystal-monohydrate) 100 MG capsule Commonly known as: MACROBID       TAKE these medications    acetaminophen  500 MG tablet Commonly known as: TYLENOL  Take 1,000 mg by mouth every 8 (eight) hours as needed for moderate pain (pain score 4-6) or mild pain (pain score 1-3).   aspirin  EC 81 MG tablet Take 81 mg by mouth daily.   carboxymethylcellulose 0.5 % Soln Commonly known as: REFRESH PLUS Place 1 drop into both eyes at bedtime.   diclofenac  Sodium 1 % Gel Commonly known as: VOLTAREN  Apply 2 g topically 4 (four) times daily.   divalproex  125 MG DR tablet Commonly known as: DEPAKOTE  Take 125 mg by mouth every evening.   docusate sodium  100 MG capsule Commonly known as: COLACE Take 100 mg by mouth every evening.   omeprazole 20 MG capsule Commonly known as: PRILOSEC Take 20 mg by mouth daily.   PRO-STAT PO Take 30 mLs by mouth every evening.         Procedures/Studies:    DG Chest Port 1 View Result Date: 04/29/2024 EXAM: 1 VIEW XRAY OF THE CHEST 04/29/2024 10:52:00 PM COMPARISON: 04/09/2024 CLINICAL HISTORY: Sepsis, gen weakness. FINDINGS: LUNGS AND PLEURA: No focal pulmonary opacity. No pleural effusion. No pneumothorax. HEART AND MEDIASTINUM: No acute abnormality of the cardiac and mediastinal silhouettes. Aortic atherosclerosis. BONES AND SOFT TISSUES: No acute osseous abnormality. IMPRESSION: 1. No acute process. Electronically signed by: Franky Crease MD 04/29/2024 10:55 PM EST RP Workstation: [id number]   DG Shoulder Left Result Date: 04/09/2024 EXAM: 1 VIEW(S) XRAY OF THE LEFT SHOULDER 04/09/2024 09:36:00 PM COMPARISON: None available.  CLINICAL HISTORY: fall FINDINGS: BONES AND JOINTS: Imaging is limited by suboptimal positioning. Slight inferior subluxation of humeral head in relation to glenoid. Degenerative changes. No acute fracture. The [facility] joint is unremarkable. SOFT TISSUES: Atherosclerotic plaque. No abnormal calcifications. Visualized lung is unremarkable. IMPRESSION: 1. No acute fracture or dislocation. 2. Slight inferior subluxation of the humeral head in relation to the glenoid. 3. Atherosclerotic plaque. 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 No acute fracture. No malalignment. Small suprapatellar knee joint effusion. SOFT TISSUES: Vascular calcifications. IMPRESSION: 1. Small suprapatellar knee joint effusion. 2. 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 3. Vascular calcifications. Electronically signed by: Dorethia Molt MD 04/09/2024 10:18 PM EST RP Workstation: [id number]   DG Chest Port 1 View Result Date:  04/09/2024 EXAM: 1 VIEW(S) XRAY OF THE CHEST 04/09/2024 08:40:00 PM COMPARISON: 02/14/2024 CLINICAL HISTORY: Questionable sepsis - evaluate for abnormality FINDINGS: LUNGS AND PLEURA: Low lung volume. Elevated right hemidiaphragm. No focal pulmonary opacity. No pleural effusion. No pneumothorax. HEART AND MEDIASTINUM: Aortic arch calcifications. No acute abnormality of the cardiac and mediastinal silhouettes. BONES AND SOFT TISSUES: Right shoulder replacement noted. No acute osseous abnormality. IMPRESSION: 1. No acute findings. Electronically signed by: Franky Stanford MD 04/09/2024 09:25 PM EST RP Workstation: [id number]   CT HEAD WO CONTRAST ( ) Result Date: 04/09/2024 EXAM: CT HEAD WITHOUT  CONTRAST 04/09/2024 09:10:00 PM TECHNIQUE: CT of the head was performed without the administration of intravenous contrast. Automated exposure control, iterative reconstruction, and/or weight based adjustment of the mA/kV was utilized to reduce the radiation dose to as low as reasonably achievable. COMPARISON: 10/14/2023 CLINICAL HISTORY: Mental status change, unknown cause FINDINGS: BRAIN AND VENTRICLES: No acute hemorrhage. No evidence of acute infarct. No hydrocephalus. No extra-axial collection. No mass effect or midline shift. Stable atrophy and chronic small vessel ischemia. ORBITS: Right cataract resection noted. SINUSES: No acute abnormality. SOFT TISSUES AND SKULL: No acute soft tissue abnormality. No skull fracture. Atherosclerosis of skullbase vasculature. IMPRESSION: 1. No acute intracranial abnormality. 2. Stable atrophy and chronic small vessel ischemia. Electronically signed by: Franky Stanford MD 04/09/2024 09:17 PM EST RP Workstation: [id number]       The results of significant diagnostics from this hospitalization (including imaging, microbiology, ancillary and laboratory) are listed below for reference.     Microbiology: Recent Results (from the past 240 hours)  Blood culture (routine x 2)     Status: None (Preliminary result)   Collection Time: 04/29/24 11:08 PM   Specimen: BLOOD  Result Value Ref Range Status   Specimen Description   Final    BLOOD BLOOD RIGHT FOREARM Performed at [facility], [address] [place], [place] [address]    Special Requests   Final    BOTTLES DRAWN AEROBIC AND ANAEROBIC Blood Culture adequate volume Performed at [facility], [address] [place], [place] [address]    Culture   Final    NO GROWTH 1 DAY Performed at [facility], [address] [place], [place] [address]    Report Status PENDING  Incomplete  Resp panel by RT-PCR (RSV, Flu A&B, Covid)     Status: None   Collection Time:  04/29/24 11:16 PM   Specimen: Nasal Swab  Result Value Ref Range Status   SARS Coronavirus 2 by RT PCR NEGATIVE NEGATIVE Final    Comment: (NOTE) SARS-CoV-2 target nucleic acids are NOT DETECTED.  The SARS-CoV-2 RNA is generally detectable in upper respiratory specimens during the acute phase of infection. The lowest concentration of SARS-CoV-2 viral copies this assay can detect is 138 copies/mL. A negative result does not preclude SARS-Cov-2 infection and should not be used as the sole basis for treatment or other patient management decisions. A negative result may occur with  improper specimen collection/handling, submission of specimen other than nasopharyngeal swab, presence of viral mutation(s) within the areas targeted by this assay, and inadequate number of viral copies(<138 copies/mL). A negative result must be combined with clinical observations, patient history, and epidemiological information. The expected result is Negative.  Fact Sheet for Patients:  bloggercourse.com  Fact Sheet for Healthcare Providers:  seriousbroker.it  This test is no t yet approved or cleared by the [place]  FDA and  has been authorized for detection and/or  diagnosis of SARS-CoV-2 by FDA under an Emergency Use Authorization (EUA). This EUA will remain  in effect (meaning this test can be used) for the duration of the COVID-19 declaration under Section 564(b)(1) of the Act, 21 U.S.C.section 360bbb-3(b)(1), unless the authorization is terminated  or revoked sooner.       Influenza A by PCR NEGATIVE NEGATIVE Final   Influenza B by PCR NEGATIVE NEGATIVE Final    Comment: (NOTE) The Xpert Xpress SARS-CoV-2/FLU/RSV plus assay is intended as an aid in the diagnosis of influenza from Nasopharyngeal swab specimens and should not be used as a sole basis for treatment. Nasal washings and aspirates are unacceptable for Xpert Xpress  SARS-CoV-2/FLU/RSV testing.  Fact Sheet for Patients: bloggercourse.com  Fact Sheet for Healthcare Providers: seriousbroker.it  This test is not yet approved or cleared by the [place]  FDA and has been authorized for detection and/or diagnosis of SARS-CoV-2 by FDA under an Emergency Use Authorization (EUA). This EUA will remain in effect (meaning this test can be used) for the duration of the COVID-19 declaration under Section 564(b)(1) of the Act, 21 U.S.C. section 360bbb-3(b)(1), unless the authorization is terminated or revoked.     Resp Syncytial Virus by PCR NEGATIVE NEGATIVE Final    Comment: (NOTE) Fact Sheet for Patients: bloggercourse.com  Fact Sheet for Healthcare Providers: seriousbroker.it  This test is not yet approved or cleared by the [place]  FDA and has been authorized for detection and/or diagnosis of SARS-CoV-2 by FDA under an Emergency Use Authorization (EUA). This EUA will remain in effect (meaning this test can be used) for the duration of the COVID-19 declaration under Section 564(b)(1) of the Act, 21 U.S.C. section 360bbb-3(b)(1), unless the authorization is terminated or revoked.  Performed at [facility], [address] [place], [place] [address]   Blood culture (routine x 2)     Status: None (Preliminary result)   Collection Time: 04/30/24  4:31 AM   Specimen: BLOOD RIGHT HAND  Result Value Ref Range Status   Specimen Description   Final    BLOOD RIGHT HAND Performed at [facility], [address], [place], [place] [address]    Special Requests   Final    BOTTLES DRAWN AEROBIC ONLY Blood Culture results may not be optimal due to an inadequate volume of blood received in culture bottles Performed at [facility], [address] [place], [place] [address]    Culture   Final    NO GROWTH <  24 HOURS Performed at [facility], [address] [place], [place] [address]    Report Status PENDING  Incomplete  Urine Culture (for pregnant, neutropenic or urologic patients or patients with an indwelling urinary catheter)     Status: None   Collection Time: 04/30/24  8:42 AM   Specimen: Urine, Clean Catch  Result Value Ref Range Status   Specimen Description   Final    URINE, CLEAN CATCH Performed at [facility], [address] [place], [place] [address]    Special Requests   Final    NONE Performed at [facility], [address] [place], [place] [address]    Culture   Final    NO GROWTH Performed at [facility], [address], [place], [place] [address]    Report Status 05/01/2024 FINAL  Final     Labs:  CBC: Recent Labs  Lab 04/29/24 2308 04/30/24 0431 05/01/24 0446  WBC 18.6* 14.1* 5.7  NEUTROABS 17.2*  --   --   HGB 11.8* 11.9* 11.3*  HCT 35.8* 34.6* 34.2*  MCV 96.0 93.8 96.1  PLT 226 218 194   BMP &GFR Recent Labs  Lab 04/29/24 2308 04/30/24 0431 05/01/24 0446  NA 138 138 140  K 3.8 3.6 3.8  CL 106 108 112*  CO2 23 20* 21*  GLUCOSE 80 75 78  BUN 29* 27* 21  CREATININE 0.86 0.84 0.88  CALCIUM 9.7 9.7 9.3  MG 1.9  --  1.9   Estimated Creatinine Clearance: 39 mL/min (by C-G formula based on SCr of 0.88 mg/dL). Liver & Pancreas: Recent Labs  Lab 04/29/24 2308 04/30/24 0431 05/01/24 0446  AST 50* 43* 26  ALT 27 23 16   ALKPHOS 76 67 68  BILITOT 0.9 0.7 0.4  PROT 5.6* 5.5* 5.5*  ALBUMIN 3.1* 3.0* 2.9*   No results for input(s): LIPASE, AMYLASE in the last 168 hours. Recent Labs  Lab 05/01/24 0646  AMMONIA 22   Diabetic: No results for input(s): HGBA1C in the last 72 hours. No results for input(s): GLUCAP in the last 168 hours. Cardiac Enzymes: Recent Labs  Lab 05/01/24 0446  CKTOTAL 21*   No results for input(s): PROBNP in the last 8760 hours. Coagulation  Profile: No results for input(s): INR, PROTIME in the last 168 hours. Thyroid  Function Tests: Recent Labs    05/01/24 0446  TSH 1.970   Lipid Profile: No results for input(s): CHOL, HDL, LDLCALC, TRIG, CHOLHDL, LDLDIRECT in the last 72 hours. Anemia Panel: Recent Labs    05/01/24 0446  VITAMINB12 351   Urine analysis:    Component Value Date/Time   COLORURINE YELLOW 04/30/2024 0112   APPEARANCEUR CLEAR 04/30/2024 0112   LABSPEC 1.017 04/30/2024 0112   PHURINE 6.0 04/30/2024 0112   GLUCOSEU NEGATIVE 04/30/2024 0112   HGBUR NEGATIVE 04/30/2024 0112   BILIRUBINUR NEGATIVE 04/30/2024 0112   KETONESUR NEGATIVE 04/30/2024 0112   PROTEINUR NEGATIVE 04/30/2024 0112   UROBILINOGEN 0.2 07/11/2010 1400   NITRITE NEGATIVE 04/30/2024 0112   LEUKOCYTESUR TRACE (A) 04/30/2024 0112   Sepsis Labs: Invalid input(s): PROCALCITONIN, LACTICIDVEN   SIGNED:  Koby Pickup T Chakia Counts, MD  [facility] 05/01/2024, 10:04 AM   "

## 2024-05-01 NOTE — Progress Notes (Signed)
 Report given to Christina at [facility] at [phone]. Contacted daughter. Staff confirmed that dc summary was available. All questions asked and answered

## 2024-05-01 NOTE — TOC Progression Note (Addendum)
 Transition of Care [facility]) - Progression Note    Patient Details  Name: Gina [name] MRN: [id number] Date of Birth: January 27, 1933  [facility] CM/SW Contact  [name], RN Phone Number: 05/01/2024, 9:02 AM  Clinical Narrative:    11:01 AM FL2 and Insurance auth started. 9:02 AM [name],Gina Daughter, called requesting information on expected discharge date because [facility] is wanting an update. Per MD last note patient is expected to discharge Friday 1/23 back to [facility] (SNF).   Expected Discharge Plan: Skilled Nursing Facility Barriers to Discharge: Continued Medical Work up               Expected Discharge Plan and Services In-house Referral: NA Discharge Planning Services: CM Consult   Living arrangements for the past 2 months: Single Family Home                 DME Arranged: N/A DME Agency: NA       HH Arranged: NA HH Agency: NA         Social Drivers of Health (SDOH) Interventions SDOH Screenings   Food Insecurity: No Food Insecurity (04/30/2024)  Housing: Low Risk (04/30/2024)  Transportation Needs: No Transportation Needs (04/30/2024)  Utilities: Not At Risk (04/30/2024)  Social Connections: Moderately Integrated (04/30/2024)  Tobacco Use: Low Risk (04/29/2024)    Readmission Risk Interventions     No data to display

## 2024-05-01 NOTE — NC FL2 (Signed)
 " [place]  MEDICAID FL2 LEVEL OF CARE FORM     IDENTIFICATION  Patient Name: Gina [name] Birthdate: 12/06/1932 Sex: female Admission Date (Current Location): 04/29/2024  [facility] [place] and [place] Number:  Producer, Television/film/video and Address:  [facility],  [address] [place], [place] [address]      Provider Number: [id number]  Attending Physician Name and Address:  Kathrin Mignon DASEN, MD  Relative Name and Phone Number:  [phone]    Current Level of Care: Hospital Recommended Level of Care: Skilled Nursing Facility Prior Approval Number:    Date Approved/Denied:   PASRR Number: [id number]  Discharge Plan: SNF    Current Diagnoses: Patient Active Problem List   Diagnosis Date Noted   Hypertension 02/27/2024   Gastroesophageal reflux disease 02/27/2024   UTI (urinary tract infection) 02/23/2024   Left renal mass 02/15/2024   DNR (do not resuscitate) 02/15/2024   OA (osteoarthritis) of knee 12/31/2023   Primary osteoarthritis of left knee 12/31/2023   CKD stage 3a, GFR 45-59 ml/min (HCC) - baseline Scr 1-1.2 11/21/2021   Left knee pain 11/21/2021   Acute metabolic encephalopathy 11/21/2021   SIRS (systemic inflammatory response syndrome) (HCC) 06/12/2020   Fall at home, initial encounter 06/12/2020   Hypotension 06/12/2020   Hypokalemia 06/12/2020   Weakness 06/12/2020    Orientation RESPIRATION BLADDER Height & Weight     Self, Time, Place  Normal External catheter, Incontinent Weight: 68 kg Height:  5' 5 (165.1 cm)  BEHAVIORAL SYMPTOMS/MOOD NEUROLOGICAL BOWEL NUTRITION STATUS      Continent Diet  AMBULATORY STATUS COMMUNICATION OF NEEDS Skin   Extensive Assist Verbally                         Personal Care Assistance Level of Assistance  Bathing, Feeding, Dressing Bathing Assistance: Limited assistance Feeding assistance: Limited assistance Dressing Assistance: Limited assistance     Functional Limitations Info  Sight, Hearing, Speech Sight  Info: Impaired Hearing Info: Impaired Speech Info: Adequate    SPECIAL CARE FACTORS FREQUENCY  PT (By licensed PT), OT (By licensed OT)     PT Frequency: 5x weekly OT Frequency: 5x weekly            Contractures Contractures Info: Not present    Additional Factors Info    Code Status Info: DNR- Intervents Allergies Info: Codeine, Strawberry Extract           Current Medications (05/01/2024):  This is the current hospital active medication list Current Facility-Administered Medications  Medication Dose Route Frequency Provider Last Rate Last Admin   (feeding supplement) PROSource Plus liquid 30 mL  30 mL Oral QPM Hugelmeyer, Alexis, DO   30 mL at 04/30/24 1757   0.9 %  sodium chloride  infusion   Intravenous Continuous Hugelmeyer, Alexis, DO 75 mL/hr at 05/01/24 0702 New Bag at 05/01/24 0702   acetaminophen  (TYLENOL ) tablet 650 mg  650 mg Oral Q6H PRN Hugelmeyer, Alexis, DO   650 mg at 05/01/24 9065   Or   acetaminophen  (TYLENOL ) suppository 650 mg  650 mg Rectal Q6H PRN Hugelmeyer, Alexis, DO       artificial tears ophthalmic solution 1 drop  1 drop Both Eyes Daily Hugelmeyer, Alexis, DO   1 drop at 04/30/24 1012   aspirin  EC tablet 81 mg  81 mg Oral Daily Hugelmeyer, Alexis, DO   81 mg at 05/01/24 0935   bisacodyl  (DULCOLAX) EC tablet 5 mg  5 mg Oral Daily  PRN Hugelmeyer, Alexis, DO       cefTRIAXone  (ROCEPHIN ) 1 g in sodium chloride  0.9 % 100 mL IVPB  1 g Intravenous Q24H Hugelmeyer, Alexis, DO 200 mL/hr at 05/01/24 0930 1 g at 05/01/24 0930   divalproex  (DEPAKOTE ) DR tablet 125 mg  125 mg Oral QPM Hugelmeyer, Alexis, DO   125 mg at 04/30/24 1753   enoxaparin  (LOVENOX ) injection 40 mg  40 mg Subcutaneous Q24H Hugelmeyer, Alexis, DO   40 mg at 05/01/24 0935   hydrALAZINE  (APRESOLINE ) injection 5 mg  5 mg Intravenous Q6H PRN Hugelmeyer, Alexis, DO       ondansetron  (ZOFRAN ) tablet 4 mg  4 mg Oral Q6H PRN Hugelmeyer, Alexis, DO       Or   ondansetron  (ZOFRAN ) injection 4 mg   4 mg Intravenous Q6H PRN Hugelmeyer, Alexis, DO       pantoprazole  (PROTONIX ) EC tablet 40 mg  40 mg Oral Daily Hugelmeyer, Alexis, DO   40 mg at 05/01/24 0935   senna-docusate (Senokot-S) tablet 1 tablet  1 tablet Oral QHS PRN Hugelmeyer, Alexis, DO       sodium chloride  flush (NS) 0.9 % injection 3 mL  3 mL Intravenous Q12H Hugelmeyer, Alexis, DO   3 mL at 05/01/24 9063   traZODone  (DESYREL ) tablet 25 mg  25 mg Oral QHS PRN Hugelmeyer, Alexis, DO         Discharge Medications: Please see discharge summary for a list of discharge medications.  Relevant Imaging Results:  Relevant Lab Results:   Additional Information SSN [id number]  [name], RN     "

## 2024-05-02 LAB — T.PALLIDUM AB, TOTAL: T Pallidum Abs: NONREACTIVE

## 2024-05-03 LAB — BLOOD GAS, VENOUS
Acid-base deficit: 1.1 mmol/L (ref 0.0–2.0)
Bicarbonate: 22.4 mmol/L (ref 20.0–28.0)
Drawn by: 71375
O2 Saturation: 88.5 %
Patient temperature: 36.5
pCO2, Ven: 32 mmHg — ABNORMAL LOW (ref 44–60)
pH, Ven: 7.45 — ABNORMAL HIGH (ref 7.25–7.43)
pO2, Ven: 52 mmHg — ABNORMAL HIGH (ref 32–45)

## 2024-05-05 LAB — CULTURE, BLOOD (ROUTINE X 2)
Culture: NO GROWTH
Culture: NO GROWTH
Special Requests: ADEQUATE

## 2024-06-11 ENCOUNTER — Other Ambulatory Visit (HOSPITAL_COMMUNITY)

## 2024-06-18 ENCOUNTER — Inpatient Hospital Stay: Admitting: Hematology and Oncology

## 2024-06-18 ENCOUNTER — Inpatient Hospital Stay: Attending: Physician Assistant
